# Patient Record
Sex: Female | Born: 1945
Health system: Western US, Academic
[De-identification: ages and names within clinical notes are randomized; demographics above are authoritative.]

## PROBLEM LIST (undated history)

## (undated) DIAGNOSIS — E119 Type 2 diabetes mellitus without complications: Secondary | ICD-10-CM

## (undated) DIAGNOSIS — I10 Essential (primary) hypertension: Secondary | ICD-10-CM

## (undated) DIAGNOSIS — R5381 Other malaise: Secondary | ICD-10-CM

## (undated) DIAGNOSIS — R000 Tachycardia, unspecified: Secondary | ICD-10-CM

## (undated) DIAGNOSIS — R1084 Generalized abdominal pain: Secondary | ICD-10-CM

## (undated) DIAGNOSIS — K5150 Left sided colitis without complications: Secondary | ICD-10-CM

## (undated) DIAGNOSIS — E7800 Pure hypercholesterolemia, unspecified: Secondary | ICD-10-CM

## (undated) DIAGNOSIS — R1902 Left upper quadrant abdominal swelling, mass and lump: Secondary | ICD-10-CM

## (undated) DIAGNOSIS — I952 Hypotension due to drugs: Secondary | ICD-10-CM

## (undated) DIAGNOSIS — K529 Noninfective gastroenteritis and colitis, unspecified: Secondary | ICD-10-CM

## (undated) DIAGNOSIS — Z8679 Personal history of other diseases of the circulatory system: Secondary | ICD-10-CM

## (undated) DIAGNOSIS — N200 Calculus of kidney: Secondary | ICD-10-CM

## (undated) DIAGNOSIS — F411 Generalized anxiety disorder: Secondary | ICD-10-CM

## (undated) DIAGNOSIS — E538 Deficiency of other specified B group vitamins: Secondary | ICD-10-CM

## (undated) HISTORY — DX: Left upper quadrant abdominal swelling, mass and lump: R19.02

## (undated) HISTORY — DX: Hypercalcemia: E83.52

## (undated) HISTORY — PX: ABDOMINAL HYSTERECTOMY: SHX81

## (undated) HISTORY — DX: Pure hypercholesterolemia, unspecified: E78.00

## (undated) HISTORY — DX: Other malaise: R53.81

## (undated) HISTORY — DX: Noninfective gastroenteritis and colitis, unspecified: K52.9

## (undated) HISTORY — DX: Generalized abdominal pain: R10.84

## (undated) HISTORY — DX: Hypotension due to drugs: I95.2

## (undated) HISTORY — DX: Generalized anxiety disorder: F41.1

## (undated) HISTORY — DX: Tachycardia, unspecified: R00.0

## (undated) HISTORY — DX: Personal history of other diseases of the circulatory system: Z86.79

## (undated) HISTORY — DX: Left sided colitis without complications: K51.50

## (undated) HISTORY — DX: Deficiency of other specified B group vitamins: E53.8

---

## 2014-06-22 ENCOUNTER — Encounter: Payer: Self-pay | Admitting: Emergency Medicine

## 2014-06-22 ENCOUNTER — Emergency Department (EMERGENCY_DEPARTMENT_HOSPITAL): Payer: 59

## 2014-06-22 ENCOUNTER — Emergency Department
Admission: EM | Admit: 2014-06-22 | Discharge: 2014-06-22 | Payer: 59 | Attending: Emergency Medicine | Admitting: Emergency Medicine

## 2014-06-22 DIAGNOSIS — E162 Hypoglycemia, unspecified: Secondary | ICD-10-CM | POA: Insufficient documentation

## 2014-06-22 DIAGNOSIS — R0989 Other specified symptoms and signs involving the circulatory and respiratory systems: Secondary | ICD-10-CM

## 2014-06-22 DIAGNOSIS — E11649 Type 2 diabetes mellitus with hypoglycemia without coma: Principal | ICD-10-CM | POA: Insufficient documentation

## 2014-06-22 DIAGNOSIS — Z794 Long term (current) use of insulin: Secondary | ICD-10-CM | POA: Insufficient documentation

## 2014-06-22 DIAGNOSIS — R918 Other nonspecific abnormal finding of lung field: Secondary | ICD-10-CM

## 2014-06-22 DIAGNOSIS — R9431 Abnormal electrocardiogram [ECG] [EKG]: Secondary | ICD-10-CM

## 2014-06-22 DIAGNOSIS — E876 Hypokalemia: Secondary | ICD-10-CM | POA: Insufficient documentation

## 2014-06-22 DIAGNOSIS — I10 Essential (primary) hypertension: Secondary | ICD-10-CM | POA: Insufficient documentation

## 2014-06-22 DIAGNOSIS — I517 Cardiomegaly: Secondary | ICD-10-CM

## 2014-06-22 DIAGNOSIS — I160 Hypertensive urgency: Secondary | ICD-10-CM | POA: Insufficient documentation

## 2014-06-22 HISTORY — DX: Essential (primary) hypertension: I10

## 2014-06-22 HISTORY — DX: Type 2 diabetes mellitus without complications: E11.9

## 2014-06-22 LAB — COMPREHENSIVE METABOLIC PANEL
ALANINE TRANSFERASE (ALT): 21 U/L (ref 5–54)
ALBUMIN: 2.1 g/dL — AB (ref 3.2–4.6)
ALKALINE PHOSPHATASE (ALP): 143 U/L — AB (ref 35–115)
ASPARTATE TRANSAMINASE (AST): 48 U/L — AB (ref 15–43)
BILIRUBIN TOTAL: 0.4 mg/dL (ref 0.3–1.3)
CALCIUM: 8.1 mg/dL — AB (ref 8.6–10.5)
CARBON DIOXIDE TOTAL: 30 meq/L (ref 24–32)
CHLORIDE: 98 meq/L (ref 95–110)
CREATININE BLOOD: 1 mg/dL (ref 0.44–1.27)
E-GFR, NON-AFRICAN AMERICAN: 55 SEE NOTE — AB (ref >60)
GLUCOSE: 130 mg/dL — AB (ref 70–99)
POTASSIUM: 1.8 meq/L — AB (ref 3.3–5.0)
PROTEIN: 5.3 g/dL — AB (ref 6.3–8.3)
SODIUM: 138 meq/L (ref 135–145)
UREA NITROGEN, BLOOD (BUN): 5 mg/dL — AB (ref 8–22)

## 2014-06-22 LAB — CBC WITH DIFFERENTIAL
BASOPHILS % AUTO: 0.2 %
BASOPHILS ABS AUTO: 0 10*3/uL (ref 0–0.2)
EOSINOPHIL % AUTO: 0.6 %
EOSINOPHIL ABS AUTO: 0.1 10*3/uL (ref 0–0.5)
HEMATOCRIT: 29.9 % — AB (ref 36–46)
HEMOGLOBIN: 9.4 g/dL — AB (ref 12.0–16.0)
LYMPHOCYTE ABS AUTO: 0.7 10*3/uL — AB (ref 1.0–4.8)
LYMPHOCYTES % AUTO: 6.7 %
MCH: 26.7 pg — AB (ref 27–33)
MCHC: 31.3 % — AB (ref 32–36)
MCV: 85.4 UM3 (ref 80–100)
MONOCYTES % AUTO: 6.1 %
MONOCYTES ABS AUTO: 0.6 10*3/uL (ref 0.1–0.8)
MPV: 7.1 UM3 (ref 6.8–10.0)
NEUTROPHIL ABS AUTO: 9.1 10*3/uL — AB (ref 1.80–7.70)
NEUTROPHILS % AUTO: 86.4 %
PLATELET COUNT: 319 10*3/uL (ref 130–400)
RDW: 16.5 U — AB (ref 0–14.7)
RED CELL COUNT: 3.5 10*6/uL — AB (ref 4.0–5.2)
WHITE BLOOD CELL COUNT: 10.6 10*3/uL (ref 4.5–11.0)

## 2014-06-22 LAB — BASIC METABOLIC PANEL
CALCIUM: 8.2 mg/dL — AB (ref 8.6–10.5)
CARBON DIOXIDE TOTAL: 30 meq/L (ref 24–32)
CHLORIDE: 97 meq/L (ref 95–110)
CREATININE BLOOD: 1.01 mg/dL (ref 0.44–1.27)
E-GFR, NON-AFRICAN AMERICAN: 54 SEE NOTE — AB (ref >60)
GLUCOSE: 71 mg/dL (ref 70–99)
POTASSIUM: 2.3 meq/L — AB (ref 3.3–5.0)
SODIUM: 139 meq/L (ref 135–145)
UREA NITROGEN, BLOOD (BUN): 5 mg/dL — AB (ref 8–22)

## 2014-06-22 LAB — BLD GAS VENOUS
BASE EXCESS, VEN: 6 meq/L — AB (ref <=2)
HCO3, VEN: 32 meq/L — AB (ref 20–28)
O2 SAT, VEN: 72 % (ref 70–100)
PCO2, VEN: 52 mmHg — AB (ref 35–50)
PH, VEN: 7.4 (ref 7.3–7.4)
PO2, VEN: 39 mmHg (ref 30–55)

## 2014-06-22 LAB — THYROXINE, FREE (FREE T4): THYROXINE, FREE (FREE T4): 1.03 ng/dL (ref 0.56–1.64)

## 2014-06-22 LAB — LACTIC ACID, WHOLE BLD VENOUS: LACTIC ACID, WHOLE BLD VENOUS: 1.2 mmol/L (ref 0.9–1.7)

## 2014-06-22 LAB — TSH WITH FREE T4 REFLEX: THYROID STIMULATING HORMONE: 0.15 u[IU]/mL — AB (ref 0.35–3.30)

## 2014-06-22 LAB — TROPONIN I: TROPONIN I - FS: 0.03 ng/mL (ref 0–0.04)

## 2014-06-22 MED ORDER — DEXTROSE 50 % IN WATER (D50W) INTRAVENOUS SYRINGE
25.0000 g | INJECTION | Freq: Once | INTRAVENOUS | Status: AC
Start: 2014-06-22 — End: 2014-06-22
  Administered 2014-06-22: 25 g via INTRAVENOUS

## 2014-06-22 MED ORDER — POTASSIUM CHLORIDE 20 MEQ/15 ML ORAL LIQUID
40.0000 meq | Freq: Once | ORAL | Status: DC
Start: 2014-06-22 — End: 2014-06-22
  Filled 2014-06-22: qty 30

## 2014-06-22 MED ORDER — NACL 0.9% IV BOLUS - DURATION REQ
1000.0000 mL | Freq: Once | INTRAVENOUS | Status: AC
Start: 2014-06-22 — End: 2014-06-22
  Administered 2014-06-22: 1000 mL via INTRAVENOUS

## 2014-06-22 MED ORDER — POTASSIUM CHLORIDE 20 MEQ/50 ML IN STERILE WATER INTRAVENOUS PIGGYBACK
20.0000 meq | INJECTION | INTRAVENOUS | Status: AC
Start: 2014-06-22 — End: 2014-06-23
  Administered 2014-06-22 (×2): 20 meq via INTRAVENOUS
  Filled 2014-06-22 (×2): qty 1

## 2014-06-22 MED ORDER — ONDANSETRON HCL (PF) 4 MG/2 ML INJECTION SOLUTION
4.0000 mg | Freq: Three times a day (TID) | INTRAMUSCULAR | Status: DC | PRN
Start: 2014-06-22 — End: 2014-06-23
  Administered 2014-06-22: 4 mg via INTRAVENOUS

## 2014-06-22 MED ORDER — METOCLOPRAMIDE 5 MG/ML INJECTION SOLUTION
10.0000 mg | Freq: Once | INTRAMUSCULAR | Status: AC
Start: 2014-06-22 — End: 2014-06-22
  Administered 2014-06-22: 10 mg via INTRAVENOUS
  Filled 2014-06-22: qty 2

## 2014-06-22 MED ORDER — LABETALOL 5 MG/ML INTRAVENOUS SOLUTION
5.0000 mg | Freq: Once | INTRAVENOUS | Status: AC
Start: 2014-06-22 — End: 2014-06-22
  Administered 2014-06-22: 5 mg via INTRAVENOUS

## 2014-06-22 NOTE — Discharge Instructions (Signed)
Patient will need ongoing potassium checks and replacement as needed  She will need glucose checks  Her wound vac will need re-changing

## 2014-06-22 NOTE — ED Nursing Note (Signed)
Spoke with [facility], updated with information. Patient remains with no change in condition at this time.

## 2014-06-22 NOTE — ED Initial Note (Signed)
EMERGENCY DEPARTMENT PHYSICIAN NOTE - Chriss Driver       Date of Service:   06/22/2014  5:35 PM Patient's PCP: No primary care provider on file.   Note Started: 06/22/2014 17:51 DOB: 01/29/1946             Chief Complaint   Patient presents with    Altered Mental Status Acute     944 Trauma Activation    The history provided by the EMS peronnel.  Interpreter used: No    Peggy Stephens is a 69yo woman with DM2, who presents to the ED with a chief complaint of altered mental status that began 1.5 hours ago. Per EMS, patient was last seen normal by family 1.5 hours ago, and was later found altered which caused them to call EMS. She was given 1 mg Glucagon en route, with a blood sugar reading of "low". She was initially GCS 3 and AxO1 en route. She arrived with a Zosyn pump that was reportedly off. History limited 2/2 altered mental status.    EMS Vitals:   BP 168/120  HR 122  Initially 90% on RA, improved to 98% on 2L     Current Med List from [facility]:  -Lisinopril 20  -Atorva 20  -Clopidogrel 75  -Docusate  -Furosemide 80-120 daily   -Glipizde 5 BID  -Humalog 24-32 ISS TIDAC  -NPH 35 QHS    A full history, including pertinent past medical and social history was reviewed.    HISTORY:  There are no hospital problems to display for this patient.   Allergies not on file   History of DM2 No past surgical history on file.   Social History    Marital Status:                     Spouse Name:                       Years of Education:                 Number of children:               Occupational History    None on file    Social History Main Topics    Smoking Status: never smoker                 Smokeless Status: Not on file                       Alcohol Use: Not on file     Drug Use: Not on file     Sexual Activity: Not on file          Other Topics            Concern    None on file    Social History Narrative    None on file     No family history on file.       Review of Systems   Unable to perform ROS: Mental  status change   Constitutional: Negative for chills and fatigue.     TRIAGE VITAL SIGNS:  Temp: (not recorded)  Temp src: (not recorded)  Pulse: 105 (06/22/14 1739)  BP: 189/76 mmHg (06/22/14 1739)  Resp: 20 (06/22/14 1739)  SpO2: 100 % (06/22/14 1739)  Weight: (not recorded)    Physical Exam   Constitutional: She appears well-developed and well-nourished.   HENT:  Head: Normocephalic and atraumatic.   Mouth/Throat: Oropharynx is clear and moist.   Eyes: Conjunctivae and EOM are normal. Pupils are equal, round, and reactive to light.   Cardiovascular: Normal rate, regular rhythm, normal heart sounds and intact distal pulses.  Exam reveals no gallop and no friction rub.    No murmur heard.  Pulses:       Radial pulses are 2+ on the right side, and 2+ on the left side.        Dorsalis pedis pulses are 2+ on the right side, and 2+ on the left side.   Pulmonary/Chest: Effort normal and breath sounds normal. No respiratory distress. She has no wheezes. She has no rales.   Abdominal: Soft. There is no tenderness.   Musculoskeletal: She exhibits no edema.   Neurological: She is alert.   Oriented only to name.   Skin: Skin is warm and dry.   R ankle with overlying bandage.   Psychiatric:   Unable to assess due to patient disoriented.   Nursing note and vitals reviewed.      INITIAL ASSESSMENT & PLAN, MEDICAL DECISION MAKING, ED COURSE:  Peggy Stephens is a 33yr old female who presents with a chief complaint of altered mental status. After history and exam, I feel the diagnosis is most likely hypoglycemia. Differential diagnosis includes, but is not limited to hypoglycemia, stroke, infectious encephalopathy, metabolic encephalopathy and intoxication. The patient is hemodynamically stable and will require clinical monitoring as an immediate intervention. Initial treatment and studies to evaluate this problem will include serial exams, labs and medications.     ED Course Update: The patient was observed in the ED. The results  of the ED evaluation were notable for the following:    Pertinent lab results:   CMP: pending  CBC: H&H 9.4/29.9  LA, WBV: 1.2  VBG: 7.40/52  A1C: pending  TSH: 0.15  BMP: K 2.3, bun 5, [place] 8.2  Troponin: 0.03, unremarkable  UA: pending  Thyroxine: unremarkable  POC Glucose: 29  POC Glucose x2: 195  POC Glucose x3: 90   POC Glucose x4: 90  POC Glucose x5: 98    Pertinent imaging results (reviewed and interpreted independently by me):   CHEST 1 VIEW  PICC line visualized, mild cardiomegaly, no consolidation.    Radiology reads:   CHEST 1 VIEW   1.Left PICC line tip is obscured but can be followed to the upper SVC.   Consider repeat radiograph with PA technique if clinically indicated.    2.Cardiomegaly and mild pulmonary vascular congestion.    Pertinent medications:   Medications   Ondansetron (ZOFRAN) Injection 4 mg (4 mg IV Given 06/22/14 1804)   Potassium Chloride 20 mEq/50 mL in Sterile Water [employer] Administration) IVPB (20 mEq IV New bag/syringe 06/22/14 2159)   NaCl 0.9% Bolus 1,000 mL (not administered)   Labetalol (NORMODYNE) Injection 5 mg (5 mg IV Given 06/22/14 1805)   Metoclopramide (REGLAN) Injection 10 mg (10 mg IV Given 06/22/14 2200)   Dextrose 50% Injection 25 g (25 g IV Given 06/22/14 1740)       Further MDM  Additional data reviewed? N/A    Interventions:   Antiemetics  NS IVF  IV labetolol  IV dextrose  IV potassium    ED Course:   Peggy Stephens is a 82yo woman with DM2 who presents with AMS 2/2 hypoglycemia as well as hypertensive urgency, the former responsive to D50 and the latter to labetalol x1. Neuro exam  entirely normal. Emesis x1 the immediate aftermath of glucagon in the field, resolved. Mechanism of hypoglyemia most likely decreased renal clearance in turn most likely from mild AKI, commonly caused by intravascular volume depletion due to the scorching [place] sun.    ATTENDING PHYSICIAN MEDICAL DECISION MAKING:  I saw the patient independently and performed my own focused exam and  history. Throughout the patient's ED course, I performed repeated bedside assessments, monitored ongoing vital signs, evaluated and interpreted the diagnostic data, and assessed for response to therapy. The findings of the diagnostic studies performed during this visit were discussed in detail with the patient or their designated representative whenever possible.    Patient Summary:   Peggy Stephens is a 43yo woman with DM2 who presents with AMS 2/2 hypoglycemia in the setting of a sulfonylurea and insulin, as well as hypertensive urgency and hypokalemia. HTN improved with labetalol. Dextrose and food stabilized BG. Hypokalemia treated with potassium IV. No reoccurrence of hypoglycemia and appropriate for floor admission. Mikael Spray EPRP would like to transfer the patient and this is appropriate.    An emergency medical condition exists. Stabilizing treatment was initiated. The patient will require admission to the hospital for further stabilization and ongoing treatment including potassium, serial glucose checks. Consultation was obtained from [facility] and admission was discussed/patient accepted. Admission plan discussed with patient (or family) and they agree with the plan.  LAST VITAL SIGNS:  Temp: (not recorded)  Temp src: (not recorded)  Pulse: 109 (06/22/14 1741)  BP: 183/87 mmHg (06/22/14 1741)  Resp: 20 (06/22/14 1741)  SpO2: 100 % (06/22/14 1741)  Weight: (not recorded)    Clinical Impression:     ICD-10-CM    1. Hypoglycemia E16.2    2. Hypokalemia E87.6    3. Hypertensive urgency I10          Disposition: Observation (likely transfer to [facility])    PRESENT ON ADMISSION:  Are any of the following four conditions present or suspected on admission: decubitus ulcer, infection from an intravascular device, infection due to an indwelling catheter, surgical site infection or pneumonia? No.    PATIENT'S GENERAL CONDITION:  Fair: Vital signs are stable and within normal limits. Patient is conscious but may be  uncomfortable. Indicators are favorable.    SCRIBE STATEMENT  I, Muling Lin, SCRIBE,  am personally taking down the notes in the presence of Dr. Brigitte Pulse and Jasper Riling  .  Electronically signed by - Georgia Dom, SCRIBE, Scribe  06/22/2014  17:51    SCRIBE DISCLAIMER   I, Brigitte Pulse, MD, personally performed the services described in this documentation, as scribed by the trained medical scribe above in my presence, and it is both accurate and complete.     Electronically signed by: Brigitte Pulse, MD, Resident      This patient was seen, evaluated, and care plan was developed with the resident.  I agree with the findings and plan as outlined in our combined note. I personally independently visualized the images and tracings as noted above.      Isa Rankin, MD      Electronically signed by: Isa Rankin, MD, Attending Physician                                        Review of Systems  Physical Exam

## 2014-06-22 NOTE — ED Nursing Note (Signed)
BIBA from home, last seen normal hour and half ago, GCS 3, unable to obtain access, BG "low", VSS 1mg  glucagon, GCS improved, external fixation to R ankle that was placed approx 1 month ago, PICC line in place, PIV established 25G dextrose administered on arrival, work up in progress

## 2014-06-22 NOTE — ED Nursing Note (Signed)
Pt transported to [facility] ED via [facility] ground transport. Pt alert & oriented x 4, ABC's intact, -C/P, -SOB, +N/-V.

## 2014-06-22 NOTE — ED Nursing Note (Signed)
Apple juice provided

## 2014-06-22 NOTE — Allied Health Progress (Signed)
CLINICAL SOCIAL SERVICES   CRISIS SERVICES CRITICAL CARE NOTE  Note Date and Time: 06/22/2014    18:36  Date of Admission: 06/22/2014  6:16 PM    Date of Service: 6/19/2-16 Referred By: medical team   Patient Name: Peggy Stephens Ethnicity: The patient's reported ethnicity is , and she identifies as Not Hispanic or Latino.        DOB: 19-Jun-1945  Age: 69yr      REASON FOR REFERRAL: [name] is a 69 year old woman who comes in by ambulance from home when her son found her not responsive in bed.  He has seen her about 1.5 hours before, and was not able to wake her up so he called 911.  Medics found her with a GCS of 3 and very low blood sugar.  She responded to treatment very quickly in the ER, and became responsive, with a GCS of 15.  She was able to be alert, awake and does not remember going unconscious.    PRESENTING PROBLEMS: Diabetic, low blood suger    PSYCHOSOCIAL INFO: Pt comes in with an external fixator on her left leg.  When she woke up she reports she had reconstructive surgery on her left foot.    DEMOGRAPHICS:   [address] [place] [address]  Phone: [phone] (home)    EMERGENCY CONTACTS: Husband - Jazzi Pastran - [phone], son, Emilio Aspen - [phone]    ASSESSMENT:  The patient and family's: This patient is being transferred to [facility] now that she has stablized.  Her family knows of this plan.  Ability to cope with current situation: Good, patient reports she feels much better now that her sugars are better.  Support system: Family    INTERVENTION:   ID the patient, called her husband and son, support to the patient and her family.l  DISPOSITION / PLAN:  Transfer to [facility].  Family knows of this plan.  Crisis to follow while the patient is in the ER.    Saivion Goettel A. Giuseppina Quinones, LCSW  Crisis Services, pg. [phone]

## 2014-06-22 NOTE — ED Nursing Note (Signed)
Pt sitting up, states nausea is now resolved, BG 98, attempting to eat crackers

## 2014-06-22 NOTE — ED Triage Note (Signed)
BIBA ALOC FS "low" by EMS; glucagon given; 160/120; 122; 16; 93%.  GCS 3.

## 2014-06-22 NOTE — ED Nursing Note (Signed)
Patient discharged for interfacility transfer. Documentation updated and complete. Interfacility Transfer form completed and reviewed. All copies of documentation provided at discharge. Patient transferred to [facility] ED and report communicated to [facility] (receiving facility) @ 2150 (time). Patient transported via EMS ground ([facility]). Patient's belongings sent with transport crew.    Pt's spouse Damita Ciaccia notified of transport.  [phone]

## 2014-06-23 LAB — HEMOGLOBIN A1C
HGB A1C,GLUCOSE EST AVG: 117 mg/dL
HGB A1C: 5.7 % — AB (ref 3.9–5.6)

## 2014-06-26 LAB — ELECTROCARDIOGRAM WITH RHYTHM STRIP: QTC: 505

## 2014-11-15 ENCOUNTER — Emergency Department (HOSPITAL_COMMUNITY): Payer: Medicare Other

## 2014-11-15 ENCOUNTER — Emergency Department (HOSPITAL_COMMUNITY)
Admission: EM | Admit: 2014-11-15 | Discharge: 2014-11-16 | Disposition: A | Discharge status: Home / Self Care | Payer: Medicare Other | Attending: Emergency Medicine | Admitting: Emergency Medicine

## 2014-11-15 ENCOUNTER — Encounter (HOSPITAL_COMMUNITY): Payer: Self-pay

## 2014-11-15 DIAGNOSIS — Z8639 Personal history of other endocrine, nutritional and metabolic disease: Secondary | ICD-10-CM | POA: Diagnosis not present

## 2014-11-15 DIAGNOSIS — N201 Calculus of ureter: Secondary | ICD-10-CM | POA: Diagnosis not present

## 2014-11-15 DIAGNOSIS — Z7982 Long term (current) use of aspirin: Secondary | ICD-10-CM | POA: Diagnosis not present

## 2014-11-15 DIAGNOSIS — R109 Unspecified abdominal pain: Secondary | ICD-10-CM | POA: Diagnosis present

## 2014-11-15 DIAGNOSIS — R195 Other fecal abnormalities: Secondary | ICD-10-CM | POA: Diagnosis not present

## 2014-11-15 DIAGNOSIS — R197 Diarrhea, unspecified: Secondary | ICD-10-CM | POA: Diagnosis not present

## 2014-11-15 HISTORY — DX: Pure hypercholesterolemia, unspecified: E78.00

## 2014-11-15 LAB — URINE MICROSCOPIC-ADD ON

## 2014-11-15 LAB — CBC WITH DIFFERENTIAL/PLATELET
BASOS ABS: 0 10*3/uL (ref 0.0–0.1)
BASOS PCT: 0 %
Eosinophils Absolute: 0 10*3/uL (ref 0.0–0.7)
Eosinophils Relative: 0 %
HEMATOCRIT: 40.7 % (ref 36.0–46.0)
HEMOGLOBIN: 13.7 g/dL (ref 12.0–15.0)
LYMPHS PCT: 9 %
Lymphs Abs: 0.8 10*3/uL (ref 0.7–4.0)
MCH: 29.8 pg (ref 26.0–34.0)
MCHC: 33.7 g/dL (ref 30.0–36.0)
MCV: 88.7 fL (ref 78.0–100.0)
MONOS PCT: 6 %
Monocytes Absolute: 0.6 10*3/uL (ref 0.1–1.0)
NEUTROS ABS: 8 10*3/uL — AB (ref 1.7–7.7)
NEUTROS PCT: 85 %
Platelets: 238 10*3/uL (ref 150–400)
RBC: 4.59 MIL/uL (ref 3.87–5.11)
RDW: 13.6 % (ref 11.5–15.5)
WBC: 9.4 10*3/uL (ref 4.0–10.5)

## 2014-11-15 LAB — URINALYSIS, ROUTINE W REFLEX MICROSCOPIC
Bilirubin Urine: NEGATIVE
GLUCOSE, UA: NEGATIVE mg/dL
Ketones, ur: 40 mg/dL — AB
Leukocytes, UA: NEGATIVE
Nitrite: NEGATIVE
PH: 6.5 (ref 5.0–8.0)
Protein, ur: NEGATIVE mg/dL
SPECIFIC GRAVITY, URINE: 1.035 — AB (ref 1.005–1.030)
Urobilinogen, UA: 0.2 mg/dL (ref 0.0–1.0)

## 2014-11-15 LAB — COMPREHENSIVE METABOLIC PANEL
ALBUMIN: 3.7 g/dL (ref 3.5–5.0)
ALK PHOS: 62 U/L (ref 38–126)
ALT: 10 U/L — ABNORMAL LOW (ref 14–54)
AST: 15 U/L (ref 15–41)
Anion gap: 9 (ref 5–15)
BILIRUBIN TOTAL: 0.9 mg/dL (ref 0.3–1.2)
BUN: 15 mg/dL (ref 6–20)
CALCIUM: 10.6 mg/dL — AB (ref 8.9–10.3)
CO2: 25 mmol/L (ref 22–32)
CREATININE: 0.95 mg/dL (ref 0.44–1.00)
Chloride: 102 mmol/L (ref 101–111)
GFR calc Af Amer: >60 mL/min (ref >60)
GFR calc non Af Amer: 59 mL/min — ABNORMAL LOW (ref >60)
GLUCOSE: 158 mg/dL — AB (ref 65–99)
Potassium: 4.4 mmol/L (ref 3.5–5.1)
Sodium: 136 mmol/L (ref 135–145)
TOTAL PROTEIN: 7.1 g/dL (ref 6.5–8.1)

## 2014-11-15 LAB — POC OCCULT BLOOD, ED: Fecal Occult Bld: POSITIVE — AB

## 2014-11-15 MED ORDER — MORPHINE SULFATE (PF) 4 MG/ML IV SOLN
4.0000 mg | Freq: Once | INTRAVENOUS | Status: AC
  Administered 2014-11-15: 4 mg via INTRAVENOUS
  Filled 2014-11-15: qty 1

## 2014-11-15 MED ORDER — TRAMADOL HCL 50 MG PO TABS: 50.0000 mg | ORAL_TABLET | Freq: Four times a day (QID) | ORAL | Status: DC | PRN

## 2014-11-15 MED ORDER — IOHEXOL 300 MG/ML  SOLN
25.0000 mL | Freq: Once | INTRAMUSCULAR | Status: AC | PRN
  Administered 2014-11-15: 25 mL via ORAL

## 2014-11-15 MED ORDER — ONDANSETRON HCL 4 MG PO TABS: 4.0000 mg | ORAL_TABLET | Freq: Four times a day (QID) | ORAL | Status: DC

## 2014-11-15 MED ORDER — ONDANSETRON HCL 4 MG/2ML IJ SOLN
4.0000 mg | Freq: Once | INTRAMUSCULAR | Status: AC
  Administered 2014-11-15: 4 mg via INTRAVENOUS
  Filled 2014-11-15: qty 2

## 2014-11-15 MED ORDER — HYDROMORPHONE HCL 1 MG/ML IJ SOLN
1.0000 mg | Freq: Once | INTRAMUSCULAR | Status: AC
  Administered 2014-11-15: 1 mg via INTRAVENOUS
  Filled 2014-11-15: qty 1

## 2014-11-15 MED ORDER — TAMSULOSIN HCL 0.4 MG PO CAPS: 0.4000 mg | ORAL_CAPSULE | Freq: Every day | ORAL | Status: DC

## 2014-11-15 MED ORDER — KETOROLAC TROMETHAMINE 30 MG/ML IJ SOLN
30.0000 mg | Freq: Once | INTRAMUSCULAR | Status: AC
  Administered 2014-11-15: 30 mg via INTRAVENOUS
  Filled 2014-11-15: qty 1

## 2014-11-15 MED ORDER — IOHEXOL 300 MG/ML  SOLN
100.0000 mL | Freq: Once | INTRAMUSCULAR | Status: AC | PRN
  Administered 2014-11-15: 100 mL via INTRAVENOUS

## 2014-11-15 NOTE — ED Notes (Signed)
Patient transported to CT 

## 2014-11-15 NOTE — Discharge Instructions (Signed)
Kidney Stones Follow-up with urology and gastroenterology. Take Zofran for nausea. Stay well hydrated. Return for inability to tolerate fluids. Kidney stones (urolithiasis) are solid masses that form inside your kidneys. The intense pain is caused by the stone moving through the kidney, ureter, bladder, and urethra (urinary tract). When the stone moves, the ureter starts to spasm around the stone. The stone is usually passed in your pee (urine).  HOME CARE  Drink enough fluids to keep your pee clear or pale yellow. This helps to get the stone out.  Take a 24-hour pee (urine) sample as told by your doctor. You may need to take another sample every 6-12 months.  Strain all pee through the provided strainer. Do not pee without peeing through the strainer, not even once. If you pee the stone out, catch it in the strainer. The stone may be as small as a grain of salt. Take this to your doctor. This will help your doctor figure out what you can do to try to prevent more kidney stones.  Only take medicine as told by your doctor.  Make changes to your daily diet as told by your doctor. You may be told to:  Limit how much salt you eat.  Eat 5 or more servings of fruits and vegetables each day.  Limit how much meat, poultry, fish, and eggs you eat.  Keep all follow-up visits as told by your doctor. This is important.  Get follow-up X-rays as told by your doctor. GET HELP IF: You have pain that gets worse even if you have been taking pain medicine. GET HELP RIGHT AWAY IF:   Your pain does not get better with medicine.  You have a fever or shaking chills.  Your pain increases and gets worse over 18 hours.  You have new belly (abdominal) pain.  You feel faint or pass out.  You are unable to pee.   This information is not intended to replace advice given to you by your health care provider. Make sure you discuss any questions you have with your health care provider.   Document Released:  06/08/2007 Document Revised: 09/10/2014 Document Reviewed: 05/23/2012 Elsevier Interactive Patient Education [employer]2016 Elsevier Inc.

## 2014-11-15 NOTE — ED Notes (Signed)
Ashely NT ambulated pt to bathroom

## 2014-11-15 NOTE — ED Notes (Signed)
Cynthia Perry ;NT reported to me pt very weak when ambulating to bathroom and then she vomited when she went to bathroom

## 2014-11-15 NOTE — ED Notes (Signed)
Pt cannot use restroom at this time, aware specimen is needed. 

## 2014-11-15 NOTE — ED Notes (Signed)
Per EMS, patient is from home.  Patient complains of left flank pain.  Recurrent diarrhea for a month's duration and noticed blood when wiping.  She states abdominal pain but it is associated with diarrhea.  She has some n/v, chillis with subjective fever and multiple family members with strep pharyngitis at home.   EMS administered 4 mg of Zofran.  BP:  128/74 P:85 R:16

## 2014-11-15 NOTE — ED Provider Notes (Signed)
CSN: [id number]     Arrival date & time 11/15/14  1838 History   First MD Initiated Contact with Patient 11/15/14 1848     Chief Complaint  Patient presents with  . Back Pain     (Consider location/radiation/quality/duration/timing/severity/associated sxs/prior Treatment) Patient is a 69 y.o. female presenting with back pain. The history is provided by the patient and a relative. No language interpreter was used.  Back Pain Associated symptoms: abdominal pain   Associated symptoms: no fever   Ms. Nathanial MillmanStrom is a 69 y.o female with no past medical history who presents via EMS from home for sudden onset left flank pain that began 3 hours ago. She states she has had recurrent diarrhea for over a month and has noticed bright red blood when wiping. She is also complaining of left-sided abdominal pain. Her daughter states she normally has diarrhea and that they have cut gluten out of her diet which has helped. For the past week the diarrhea has returned even though she has not eaten gluten. She has had one episode of vomiting while in the ED. There are multiple family members at home with strep. En route to the hospital she was given 4 mg of Zofran by EMS. She denies any fever, chills, chest pain, shortness of breath, nausea, constipation, dysuria, hematuria, or urinary frequency.   Past Medical History  Diagnosis Date  . Hypercholesteremia    Past Surgical History  Procedure Laterality Date  . Abdominal hysterectomy     History reviewed. No pertinent family history. Social History  Substance Use Topics  . Smoking status: Never Smoker   . Smokeless tobacco: None  . Alcohol Use: No   OB History    No data available     Review of Systems  Constitutional: Negative for fever and chills.  Gastrointestinal: Positive for vomiting, abdominal pain, diarrhea and blood in stool.  Musculoskeletal: Positive for back pain.  All other systems reviewed and are negative.     Allergies  Review of  patient's allergies indicates no known allergies.  Home Medications   Prior to Admission medications   Medication Sig Start Date End Date Taking? Authorizing Provider  aspirin 325 MG tablet Take 325 mg by mouth once.   Yes Historical Provider, MD  ondansetron (ZOFRAN) 4 MG tablet Take 1 tablet (4 mg total) by mouth every 6 (six) hours. 11/15/14   Adelaine Roppolo Patel-Mills, PA-C  tamsulosin (FLOMAX) 0.4 MG CAPS capsule Take 1 capsule (0.4 mg total) by mouth daily after breakfast. 11/15/14   Catha GosselinHanna Patel-Mills, PA-C  traMADol (ULTRAM) 50 MG tablet Take 1 tablet (50 mg total) by mouth every 6 (six) hours as needed. 11/15/14   Yamili Lichtenwalner Patel-Mills, PA-C   BP 103/60 mmHg  Pulse 80  Temp(Src) 97.6 F (36.4 C) (Oral)  Resp 16  SpO2 93% Physical Exam  Constitutional: She is oriented to person, place, and time. She appears well-developed and well-nourished.  HENT:  Head: Normocephalic and atraumatic.  Eyes: Conjunctivae are normal.  Neck: Normal range of motion. Neck supple.  Cardiovascular: Normal rate, regular rhythm and normal heart sounds.   Pulmonary/Chest: Effort normal and breath sounds normal. No respiratory distress. She has no wheezes. She has no rales.  Abdominal: Soft. Normal appearance. She exhibits no distension. There is tenderness in the left lower quadrant. There is CVA tenderness. There is no rebound, no guarding and negative Murphy's sign.  Left-sided CVA tenderness. Left lower quadrant abdominal pain but no guarding or rebound. No abdominal distention. Abdomen is  soft. Normal appearance.   Genitourinary:  Rectal exam: Chaperone present. No fecal impaction. Tiny hemorrhoid at the 12:00 position that is pink and soft. Brown stool noted without frank blood. No anal fissures.  Musculoskeletal: Normal range of motion.  Neurological: She is alert and oriented to person, place, and time.  Skin: Skin is warm and dry.  Nursing note and vitals reviewed.   ED Course  Procedures (including  critical care time) Labs Review Labs Reviewed  CBC WITH DIFFERENTIAL/PLATELET - Abnormal; Notable for the following:    Neutro Abs 8.0 (*)    All other components within normal limits  COMPREHENSIVE METABOLIC PANEL - Abnormal; Notable for the following:    Glucose, Bld 158 (*)    Calcium 10.6 (*)    ALT 10 (*)    GFR calc non Af Amer 59 (*)    All other components within normal limits  URINALYSIS, ROUTINE W REFLEX MICROSCOPIC (NOT AT [facility]) - Abnormal; Notable for the following:    Specific Gravity, Urine 1.035 (*)    Hgb urine dipstick LARGE (*)    Ketones, ur 40 (*)    All other components within normal limits  POC OCCULT BLOOD, ED - Abnormal; Notable for the following:    Fecal Occult Bld POSITIVE (*)    All other components within normal limits  URINE MICROSCOPIC-ADD ON    Imaging Review Ct Abdomen Pelvis W Contrast  11/15/2014  CLINICAL DATA:  Sudden-onset left flank/upper abdominal pain today with nausea, vomiting and diarrhea. EXAM: CT ABDOMEN AND PELVIS WITH CONTRAST TECHNIQUE: Multidetector CT imaging of the abdomen and pelvis was performed using the standard protocol following bolus administration of intravenous contrast. CONTRAST:  25mL OMNIPAQUE IOHEXOL 300 MG/ML SOLN, OMNIPAQUE IOHEXOL 300 MG/ML SOLN COMPARISON:  None. FINDINGS: Lung bases are normal. Abdominal images demonstrate a normal liver, spleen, pancreas, gallbladder and adrenal glands. Stomach is within normal. Appendix is within normal. Kidneys are normal in size as there are a few small sub cm renal cortical hypodensities bilaterally likely cysts, although too small to characterize. There is delayed excretion of the left kidney with mild left-sided hydronephrosis and left perinephric fluid. There is a 4 mm stone over the proximal left ureter. No right renal stones. Right ureter is normal. There is moderate calcified plaque over the abdominal aorta and iliac arteries. Mild fecal retention throughout the colon.  Small bowel is within normal. Pelvic images demonstrate the bladder and rectum to be within normal. 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 There mild degenerate changes of the spine and hips. Disc space narrowing at the L4-5 and L5-S1 levels. Grade 1 anterolisthesis of L4 on L5 with moderate to severe canal stenosis at this level. IMPRESSION: 4 mm stone over the proximal left ureter causing low-grade obstruction. Several sub cm bilateral renal cortical hypodensities too small to characterize but likely cysts. Mild degenerative change of the lumbar spine with disc disease at the L4-5 and L5-S1 levels. Grade 1 anterolisthesis of L4 on L5 with moderate to severe canal stenosis at the L4-5 level. Electronically Signed   By: Elberta Fortis M.D.   On: 11/15/2014 21:21   I have personally reviewed and evaluated these images and lab results as part of my medical decision-making.   EKG Interpretation None      MDM   Final diagnoses:  Left ureteral stone   Patient presents with left flank and abdominal pain with bloody diarrhea x 1 month. She is afebrile and vital signs are stable.  She is thrashing around in pain. I ordered a CT abdomen to r/o kidney stone vs. colitis. Her specific gravity was high and patient was given fluids. She also has large hemoglobin in the urine which may indicate kidney stone. This is consistent with the sudden onset of left flank pain. Otherwise, her labs are not concerning and kidney function is normal. She was guaiac positive also which led me to believe that she may also have colitis. She has no past medical history of kidney stones. Recheck: Nurse reports that patient vomited while using the bathroom. CT abdomen shows 4 mm stone over the proximal left ureter causing low-grade obstruction with mild left-sided hydronephrosis and perinephric fluid. She has no signs of diverticulitis or colitis. Recheck: Patient states she is feeling much better but is feeling dizzy from pain  medications. I have been able to get her pain under control and she has not vomited in the last hour. I discussed with her and family that she would be sent home on Flomax, Zofran, and narcotic pain medication. I explained that she would need to follow-up with urology and gastroenterology for further evaluation of kidney stones and lower GI bleed. I discussed return precautions with the family and they verbally agreed with the plan. Medications  morphine 4 MG/ML injection 4 mg (4 mg Intravenous Given 11/15/14 1918)  morphine 4 MG/ML injection 4 mg (4 mg Intravenous Given 11/15/14 2023)  ondansetron (ZOFRAN) injection 4 mg (4 mg Intravenous Given 11/15/14 2023)  iohexol (OMNIPAQUE) 300 MG/ML solution 25 mL (25 mLs Oral Contrast Given 11/15/14 2056)  iohexol (OMNIPAQUE) 300 MG/ML solution 100 mL (100 mLs Intravenous Contrast Given 11/15/14 2056)  HYDROmorphone (DILAUDID) injection 1 mg (1 mg Intravenous Given 11/15/14 2110)  ondansetron (ZOFRAN) injection 4 mg (4 mg Intravenous Given 11/15/14 2111)  ketorolac (TORADOL) 30 MG/ML injection 30 mg (30 mg Intravenous Given 11/15/14 2234)  ondansetron (ZOFRAN) injection 4 mg (4 mg Intravenous Given 11/15/14 2323)   Filed Vitals:   11/16/14 0030  BP: 103/60  Pulse: 80  Temp: 97.6 F (36.4 C)  Resp: [address], PA-C 11/16/14 2132  Melene Plan, DO 11/16/14 2325

## 2014-11-24 DIAGNOSIS — N201 Calculus of ureter: Secondary | ICD-10-CM | POA: Diagnosis not present

## 2014-11-24 DIAGNOSIS — N23 Unspecified renal colic: Secondary | ICD-10-CM | POA: Diagnosis not present

## 2014-12-01 DIAGNOSIS — R197 Diarrhea, unspecified: Secondary | ICD-10-CM | POA: Diagnosis not present

## 2014-12-01 DIAGNOSIS — K625 Hemorrhage of anus and rectum: Secondary | ICD-10-CM | POA: Diagnosis not present

## 2014-12-01 DIAGNOSIS — K5900 Constipation, unspecified: Secondary | ICD-10-CM | POA: Diagnosis not present

## 2014-12-08 DIAGNOSIS — R634 Abnormal weight loss: Secondary | ICD-10-CM | POA: Diagnosis not present

## 2014-12-08 DIAGNOSIS — R197 Diarrhea, unspecified: Secondary | ICD-10-CM | POA: Diagnosis not present

## 2014-12-08 DIAGNOSIS — K633 Ulcer of intestine: Secondary | ICD-10-CM | POA: Diagnosis not present

## 2014-12-08 DIAGNOSIS — K5150 Left sided colitis without complications: Secondary | ICD-10-CM | POA: Diagnosis not present

## 2014-12-08 DIAGNOSIS — K626 Ulcer of anus and rectum: Secondary | ICD-10-CM | POA: Diagnosis not present

## 2014-12-08 DIAGNOSIS — K625 Hemorrhage of anus and rectum: Secondary | ICD-10-CM | POA: Diagnosis not present

## 2014-12-08 DIAGNOSIS — K5010 Crohn's disease of large intestine without complications: Secondary | ICD-10-CM | POA: Diagnosis not present

## 2014-12-08 DIAGNOSIS — R194 Change in bowel habit: Secondary | ICD-10-CM | POA: Diagnosis not present

## 2014-12-15 DIAGNOSIS — N23 Unspecified renal colic: Secondary | ICD-10-CM | POA: Diagnosis not present

## 2014-12-15 DIAGNOSIS — N201 Calculus of ureter: Secondary | ICD-10-CM | POA: Diagnosis not present

## 2015-01-16 DIAGNOSIS — K5150 Left sided colitis without complications: Secondary | ICD-10-CM | POA: Diagnosis not present

## 2015-11-25 DIAGNOSIS — K5150 Left sided colitis without complications: Secondary | ICD-10-CM | POA: Diagnosis not present

## 2016-04-15 DIAGNOSIS — K5150 Left sided colitis without complications: Secondary | ICD-10-CM | POA: Diagnosis not present

## 2016-09-29 IMAGING — CT CT ABD-PELV W/ CM
2 of 5 series · 16 of 46 positions shown, 18 images · IV contrast (OMNIPAQUE 300)
Comparison: None.

CLINICAL DATA: Sudden-onset left flank/upper abdominal pain today
with nausea, vomiting and diarrhea.

EXAM:
CT ABDOMEN AND PELVIS WITH CONTRAST
TECHNIQUE: Multidetector CT imaging of the abdomen and pelvis was performed
using the standard protocol following bolus administration of
intravenous contrast.
CONTRAST:  25mL OMNIPAQUE IOHEXOL 300 MG/ML SOLN, 100mL OMNIPAQUE
IOHEXOL 300 MG/ML SOLN

[Series 2: abd/pel with · axial · 0.74mm/px · z∈[-432,-27]mm · 13 of 91 slices shown, 15 images]
[im 5/91  soft-tissue]
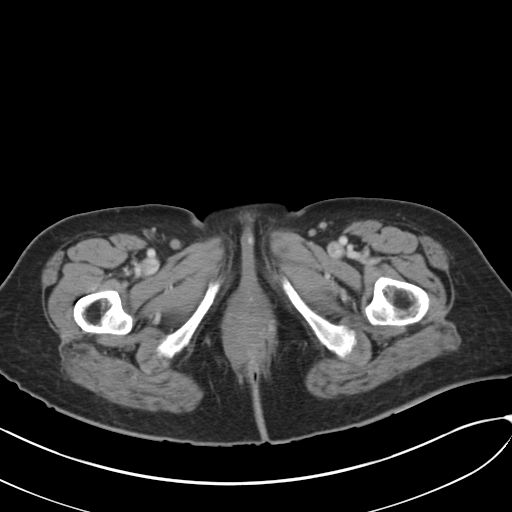
[im 5/91  bone]
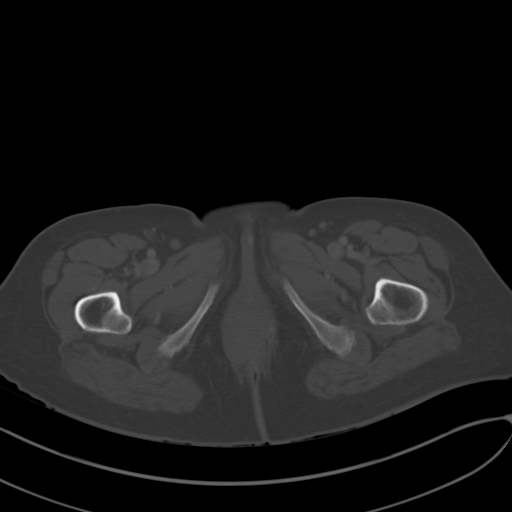
[im 14/91  soft-tissue]
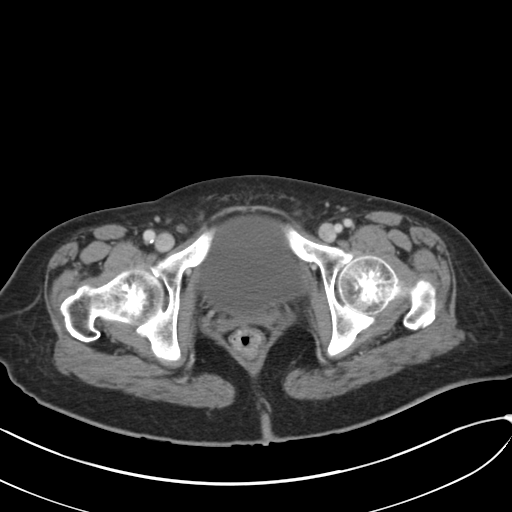
[im 19/91  soft-tissue]
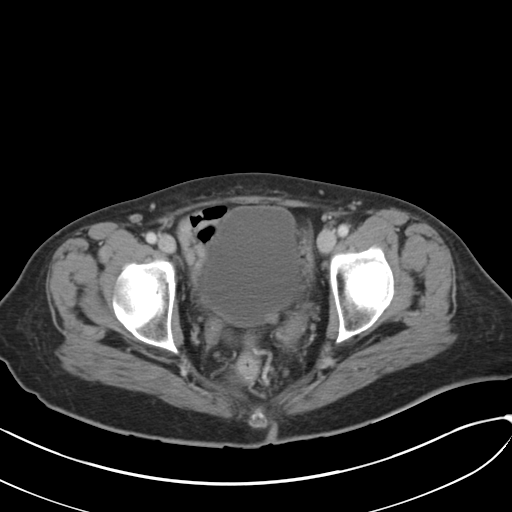
[im 28/91  soft-tissue]
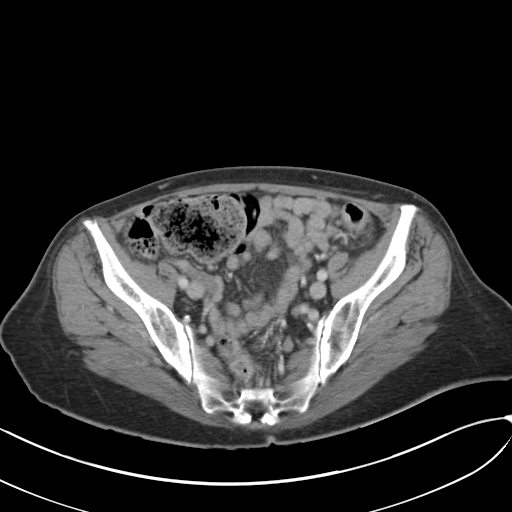
[im 32/91  soft-tissue]
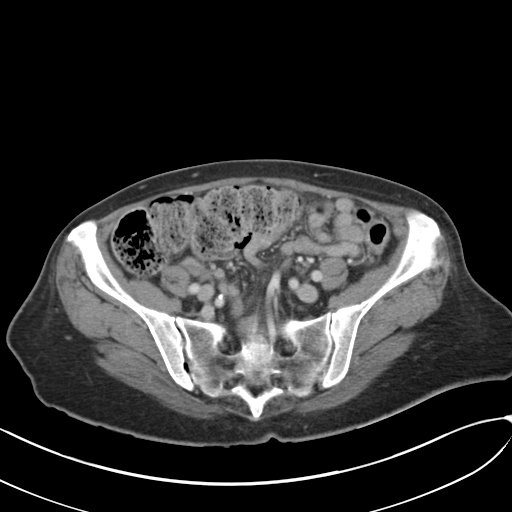
[im 41/91  soft-tissue]
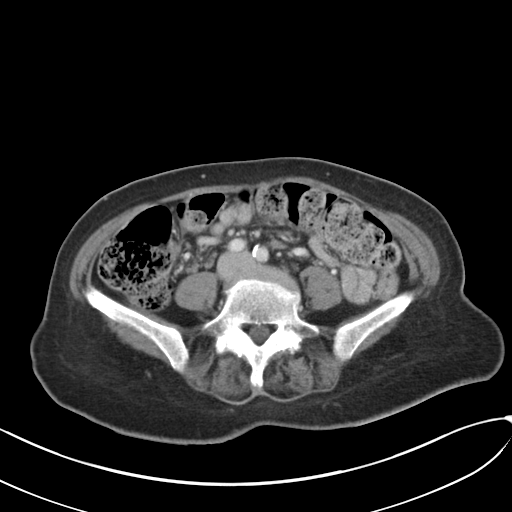
[im 46/91  soft-tissue]
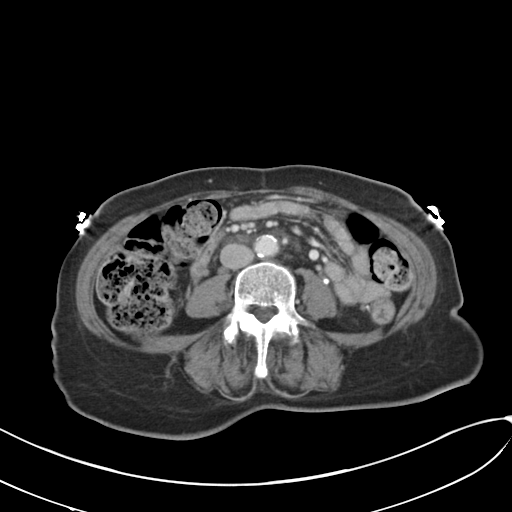
[im 50/91  soft-tissue]
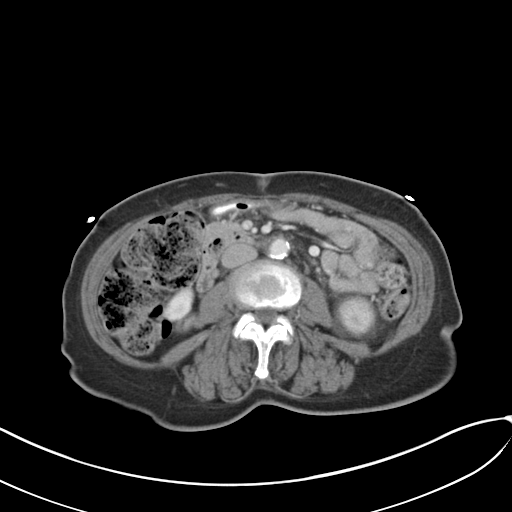
[im 59/91  soft-tissue]
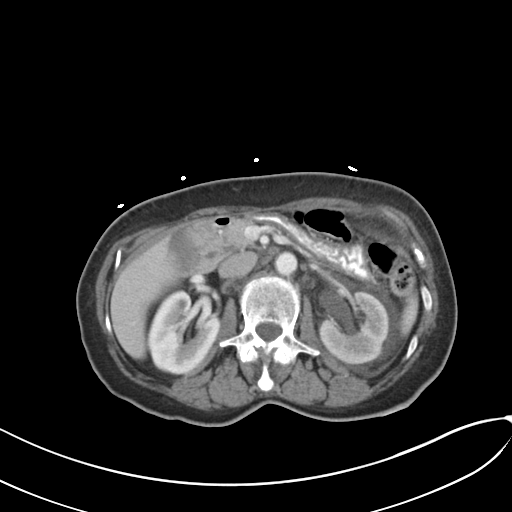
[im 59/91  bone]
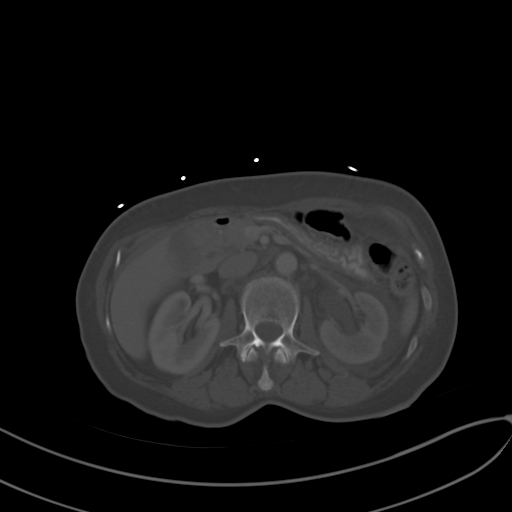
[im 64/91  soft-tissue]
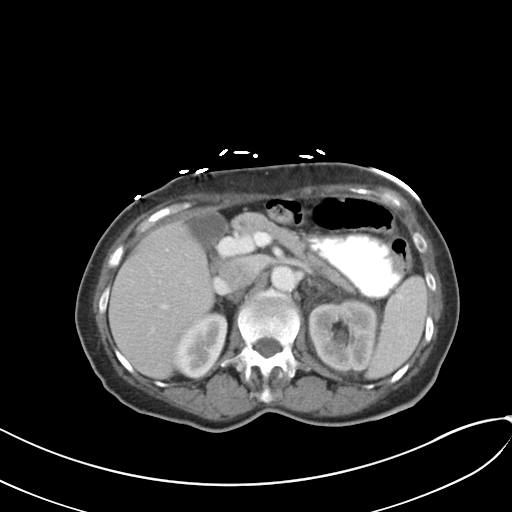
[im 73/91  soft-tissue]
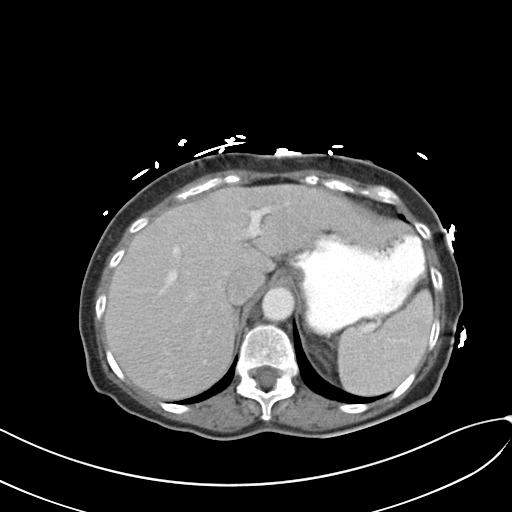
[im 77/91  soft-tissue]
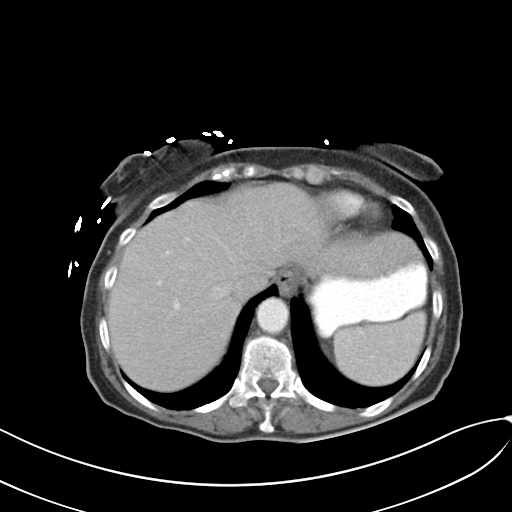
[im 86/91  soft-tissue]
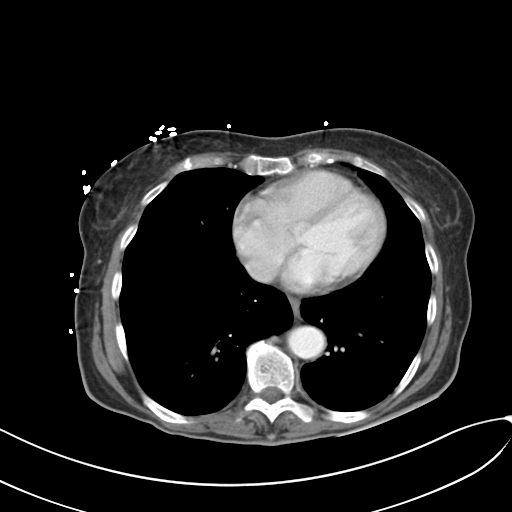

[Series 4: coronal a/|p · coronal · 0.74mm/px · 3 of 116 slices shown]
[im 39/116  soft-tissue]
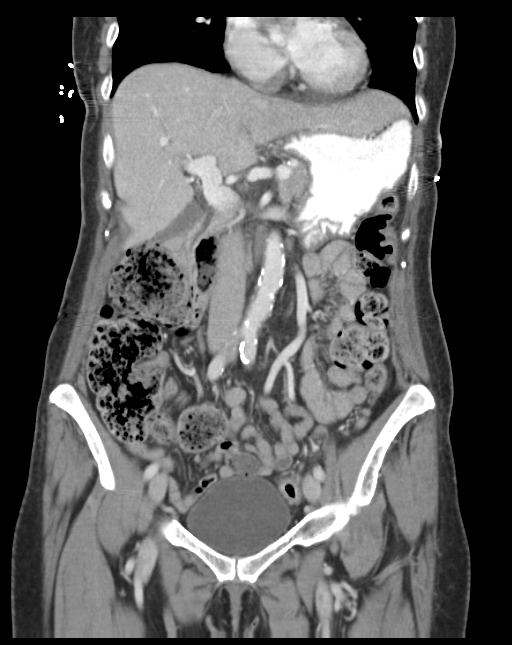
[im 52/116  soft-tissue]
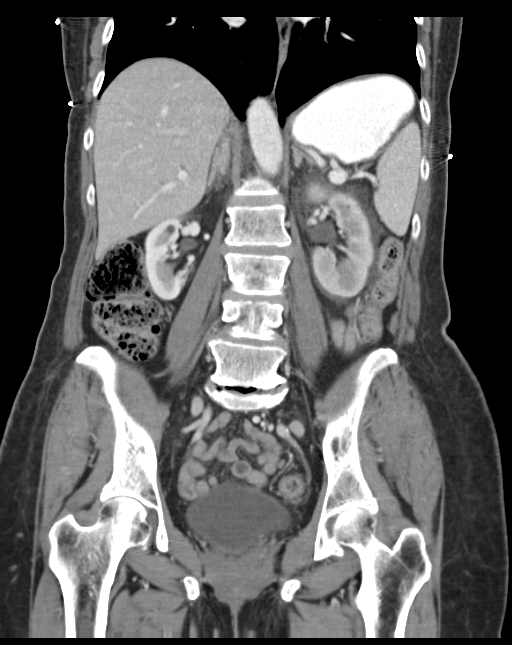
[im 64/116  soft-tissue]
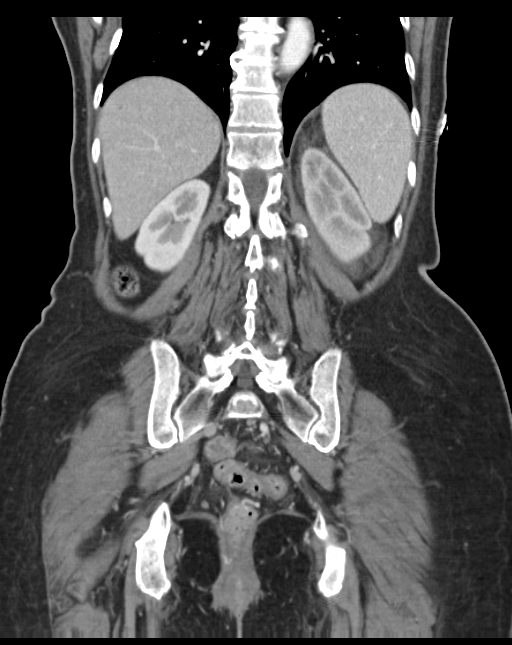

[16 of 46 positions shown; findings below may reference images not displayed]

FINDINGS: Lung bases are normal.

Abdominal images demonstrate a normal liver, spleen, pancreas,
gallbladder and adrenal glands. Stomach is within normal. Appendix
is within normal.

Kidneys are normal in size as there are a few small sub cm renal
cortical hypodensities bilaterally likely cysts, although too small
to characterize. There is delayed excretion of the left kidney with
mild left-sided hydronephrosis and left perinephric fluid. There is
a 4 mm stone over the proximal left ureter. No right renal stones.
Right ureter is normal.

There is moderate calcified plaque over the abdominal aorta and
iliac arteries. Mild fecal retention throughout the colon. Small
bowel is within normal.

Pelvic images demonstrate the bladder and rectum to be within
normal. There is surgical absence of the uterus.

There mild degenerate changes of the spine and hips. Disc space
narrowing at the L4-5 and L5-S1 levels. Grade 1 anterolisthesis of
L4 on L5 with moderate to severe canal stenosis at this level.
IMPRESSION: 4 mm stone over the proximal left ureter causing low-grade
obstruction.

Several sub cm bilateral renal cortical hypodensities too small to
characterize but likely cysts.

Mild degenerative change of the lumbar spine with disc disease at
the L4-5 and L5-S1 levels. Grade 1 anterolisthesis of L4 on L5 with
moderate to severe canal stenosis at the L4-5 level.

## 2017-01-11 DIAGNOSIS — K5150 Left sided colitis without complications: Secondary | ICD-10-CM | POA: Diagnosis not present

## 2017-02-01 ENCOUNTER — Encounter (HOSPITAL_COMMUNITY): Payer: Self-pay

## 2017-02-01 ENCOUNTER — Emergency Department (HOSPITAL_COMMUNITY): Payer: Medicare Other

## 2017-02-01 ENCOUNTER — Emergency Department (HOSPITAL_COMMUNITY)
Admission: EM | Admit: 2017-02-01 | Discharge: 2017-02-02 | Disposition: A | Discharge status: Home / Self Care | Payer: Medicare Other | Attending: Emergency Medicine | Admitting: Emergency Medicine

## 2017-02-01 DIAGNOSIS — Z79899 Other long term (current) drug therapy: Secondary | ICD-10-CM | POA: Diagnosis not present

## 2017-02-01 DIAGNOSIS — Y999 Unspecified external cause status: Secondary | ICD-10-CM | POA: Insufficient documentation

## 2017-02-01 DIAGNOSIS — W01198A Fall on same level from slipping, tripping and stumbling with subsequent striking against other object, initial encounter: Secondary | ICD-10-CM | POA: Insufficient documentation

## 2017-02-01 DIAGNOSIS — Y929 Unspecified place or not applicable: Secondary | ICD-10-CM | POA: Diagnosis not present

## 2017-02-01 DIAGNOSIS — Z7982 Long term (current) use of aspirin: Secondary | ICD-10-CM | POA: Insufficient documentation

## 2017-02-01 DIAGNOSIS — S20212A Contusion of left front wall of thorax, initial encounter: Secondary | ICD-10-CM | POA: Diagnosis not present

## 2017-02-01 DIAGNOSIS — S20219A Contusion of unspecified front wall of thorax, initial encounter: Secondary | ICD-10-CM

## 2017-02-01 DIAGNOSIS — S20302A Unspecified superficial injuries of left front wall of thorax, initial encounter: Secondary | ICD-10-CM | POA: Diagnosis present

## 2017-02-01 DIAGNOSIS — R0789 Other chest pain: Secondary | ICD-10-CM | POA: Diagnosis not present

## 2017-02-01 DIAGNOSIS — Y93E5 Activity, floor mopping and cleaning: Secondary | ICD-10-CM | POA: Insufficient documentation

## 2017-02-01 DIAGNOSIS — R42 Dizziness and giddiness: Secondary | ICD-10-CM | POA: Insufficient documentation

## 2017-02-01 DIAGNOSIS — R079 Chest pain, unspecified: Secondary | ICD-10-CM | POA: Diagnosis not present

## 2017-02-01 HISTORY — DX: Calculus of kidney: N20.0

## 2017-02-01 LAB — CBC
HCT: 43.4 % (ref 36.0–46.0)
Hemoglobin: 14.3 g/dL (ref 12.0–15.0)
MCH: 29.4 pg (ref 26.0–34.0)
MCHC: 32.9 g/dL (ref 30.0–36.0)
MCV: 89.1 fL (ref 78.0–100.0)
PLATELETS: 241 10*3/uL (ref 150–400)
RBC: 4.87 MIL/uL (ref 3.87–5.11)
RDW: 13.5 % (ref 11.5–15.5)
WBC: 6.5 10*3/uL (ref 4.0–10.5)

## 2017-02-01 LAB — BASIC METABOLIC PANEL
Anion gap: 9 (ref 5–15)
BUN: 8 mg/dL (ref 6–20)
CALCIUM: 11.1 mg/dL — AB (ref 8.9–10.3)
CO2: 25 mmol/L (ref 22–32)
CREATININE: 0.65 mg/dL (ref 0.44–1.00)
Chloride: 105 mmol/L (ref 101–111)
GFR calc non Af Amer: >60 mL/min (ref >60)
Glucose, Bld: 100 mg/dL — ABNORMAL HIGH (ref 65–99)
Potassium: 3.8 mmol/L (ref 3.5–5.1)
SODIUM: 139 mmol/L (ref 135–145)

## 2017-02-01 LAB — URINALYSIS, ROUTINE W REFLEX MICROSCOPIC
BILIRUBIN URINE: NEGATIVE
Glucose, UA: NEGATIVE mg/dL
Hgb urine dipstick: NEGATIVE
KETONES UR: NEGATIVE mg/dL
Leukocytes, UA: NEGATIVE
Nitrite: NEGATIVE
PROTEIN: NEGATIVE mg/dL
Specific Gravity, Urine: 1.013 (ref 1.005–1.030)
pH: 6 (ref 5.0–8.0)

## 2017-02-01 LAB — I-STAT TROPONIN, ED: TROPONIN I, POC: 0.01 ng/mL (ref 0.00–0.08)

## 2017-02-01 NOTE — ED Triage Notes (Signed)
Pt comes via [facility] from [facility], got dizzy this afternoon after picking something off the floor. Fall and hit her left chest on a corner of counter, some bruising noted, chest wall pain. No cardiac hx, PTA pt received 81mg  ASA and one nitro.

## 2017-02-02 DIAGNOSIS — S20212A Contusion of left front wall of thorax, initial encounter: Secondary | ICD-10-CM | POA: Diagnosis not present

## 2017-02-02 MED ORDER — ONDANSETRON 4 MG PO TBDP
4.0000 mg | ORAL_TABLET | Freq: Once | ORAL | Status: AC
Start: 2017-02-02 — End: 2017-02-02
  Administered 2017-02-02: 4 mg via ORAL
  Filled 2017-02-02: qty 1

## 2017-02-02 MED ORDER — HYDROCODONE-ACETAMINOPHEN 5-325 MG PO TABS
2.0000 | ORAL_TABLET | Freq: Once | ORAL | Status: AC
  Administered 2017-02-02: 2 via ORAL
  Filled 2017-02-02: qty 2

## 2017-02-02 MED ORDER — ONDANSETRON 4 MG PO TBDP: 4.0000 mg | ORAL_TABLET | Freq: Four times a day (QID) | ORAL | 0 refills | Status: DC | PRN

## 2017-02-02 MED ORDER — HYDROCODONE-ACETAMINOPHEN 5-325 MG PO TABS: 2.0000 | ORAL_TABLET | Freq: Four times a day (QID) | ORAL | 0 refills | Status: DC | PRN

## 2017-02-02 NOTE — ED Provider Notes (Signed)
TIME SEEN: 12:24 AM  CHIEF COMPLAINT: Chest pain  HPI: Patient is a 72 year old female with history of hyperlipidemia who presents to the emergency department with left-sided chest pain.  States that last night she was cleaning and she looked up at the ceiling and then turned quickly and felt very dizzy.  States she lost her balance and fell forward into the kitchen counter hitting the left side of her chest.  States pain is worse with palpation and deep inspiration.  Was not having any chest pain prior to hitting her chest on the countertop.  No dizziness currently.  Did not fall to the ground or strike her head.  Not on antiplatelets or anticoagulants.  ROS: See HPI Constitutional: no fever  Eyes: no drainage  ENT: no runny nose   Cardiovascular:  no chest pain  Resp: no SOB  GI: no vomiting GU: no dysuria Integumentary: no rash  Allergy: no hives  Musculoskeletal: no leg swelling  Neurological: no slurred speech ROS otherwise negative  PAST MEDICAL HISTORY/PAST SURGICAL HISTORY:  Past Medical History:  Diagnosis Date  . Hypercholesteremia   . Kidney stones     MEDICATIONS:  Prior to Admission medications   Medication Sig Start Date End Date Taking? Authorizing Provider  aspirin 325 MG tablet Take 325 mg by mouth once.    [provider]  ondansetron (ZOFRAN) 4 MG tablet Take 1 tablet (4 mg total) by mouth every 6 (six) hours. 11/15/14   Patel-Mills, Orvil Feil, PA-C  tamsulosin (FLOMAX) 0.4 MG CAPS capsule Take 1 capsule (0.4 mg total) by mouth daily after breakfast. 11/15/14   Patel-Mills, Orvil Feil, PA-C  traMADol (ULTRAM) 50 MG tablet Take 1 tablet (50 mg total) by mouth every 6 (six) hours as needed. 11/15/14   Patel-Mills, Orvil Feil, PA-C    ALLERGIES:  No Known Allergies  SOCIAL HISTORY:  Social History   Tobacco Use  . Smoking status: Never Smoker  . Smokeless tobacco: Never Used  Substance Use Topics  . Alcohol use: No    FAMILY HISTORY: No family history on  file.  EXAM: BP (!) 144/78 (BP Location: Right Arm)   Pulse 66   Temp 97.8 F (36.6 C) (Oral)   Resp 18   SpO2 98%  CONSTITUTIONAL: Alert and oriented and responds appropriately to questions. Well-appearing; well-nourished; GCS 15 HEAD: Normocephalic; atraumatic EYES: Conjunctivae clear, PERRL, EOMI ENT: normal nose; no rhinorrhea; moist mucous membranes; pharynx without lesions noted; no dental injury; no septal hematoma NECK: Supple, no meningismus, no LAD; no midline spinal tenderness, step-off or deformity; trachea midline CARD: RRR; S1 and S2 appreciated; no murmurs, no clicks, no rubs, no gallops RESP: Normal chest excursion without splinting or tachypnea; breath sounds clear and equal bilaterally; no wheezes, no rhonchi, no rales; no hypoxia or respiratory distress CHEST:  chest wall stable, no crepitus or deformity.  Patient is tender to palpation over the center in the left side of her chest with small amount of ecchymosis.  No flail chest. ABD/GI: Normal bowel sounds; non-distended; soft, non-tender, no rebound, no guarding; no ecchymosis or other lesions noted PELVIS:  stable, nontender to palpation BACK:  The back appears normal and is non-tender to palpation, there is no CVA tenderness; no midline spinal tenderness, step-off or deformity EXT: Normal ROM in all joints; non-tender to palpation; no edema; normal capillary refill; no cyanosis, no bony tenderness or bony deformity of patient's extremities, no joint effusion, compartments are soft, extremities are warm and well-perfused, no ecchymosis SKIN: Normal color  for age and race; warm NEURO: Moves all extremities equally, normal speech, cranial nerves II through XII intact, sensation to light touch intact diffusely PSYCH: The patient's mood and manner are appropriate. Grooming and personal hygiene are appropriate.  MEDICAL DECISION MAKING: Patient here with mechanical fall into her kitchen counter.  Has some bruising and  tenderness over the chest wall.  X-ray shows no sternal fracture, rib fractures, pneumothorax, pneumonia.  Seems to be a chest wall contusion.  I do not think this is ACS, PE or dissection.  The symptoms did not start until after injury.  Will discharge with pain medication as well as incentive spirometer.  Encouraged outpatient follow-up if symptoms are worsening or not improving.  Discussed return precautions.  At this time, I do not feel there is any life-threatening condition present. I have reviewed and discussed all results (EKG, imaging, lab, urine as appropriate) and exam findings with patient/family. I have reviewed nursing notes and appropriate previous records.  I feel the patient is safe to be discharged home without further emergent workup and can continue workup as an outpatient as needed. Discussed usual and customary return precautions. Patient/family verbalize understanding and are comfortable with this plan.  Outpatient follow-up has been provided if needed. All questions have been answered.      EKG Interpretation  Date/Time:  Wednesday February 01 2017 20:28:44 EST Ventricular Rate:  64 PR Interval:  176 QRS Duration: 90 QT Interval:  380 QTC Calculation: 392 R Axis:   -9 Text Interpretation:  Normal sinus rhythm Normal ECG No old tracing to compare Confirmed by Tiger Spieker, Cyril Mourning [phone]) on 02/01/2017 11:41:49 PM         Mirelle Biskup, Delice Bison, DO 02/02/17 [phone]

## 2017-02-02 NOTE — Discharge Instructions (Signed)
Please use your incentive spirometer every 2 hours while awake for the next 2 weeks.  Your x-ray showed no sign of collapsed lung or rib fracture or sternal fracture today.  Please use Colace and MiraLAX, both over-the-counter, to keep your stool soft while taking narcotic pain medication.

## 2017-02-02 NOTE — ED Notes (Signed)
Patient verbalizes understanding of discharge instructions. Opportunity for questioning and answers were provided. Armband removed by staff, pt discharged from ED.  

## 2018-01-05 ENCOUNTER — Other Ambulatory Visit: Payer: Self-pay | Admitting: Physician Assistant

## 2018-01-05 DIAGNOSIS — R1902 Left upper quadrant abdominal swelling, mass and lump: Secondary | ICD-10-CM | POA: Diagnosis not present

## 2018-01-05 DIAGNOSIS — K921 Melena: Secondary | ICD-10-CM | POA: Diagnosis not present

## 2018-01-05 DIAGNOSIS — R1084 Generalized abdominal pain: Secondary | ICD-10-CM | POA: Diagnosis not present

## 2018-01-05 DIAGNOSIS — R197 Diarrhea, unspecified: Secondary | ICD-10-CM

## 2018-01-05 DIAGNOSIS — K5150 Left sided colitis without complications: Secondary | ICD-10-CM | POA: Diagnosis not present

## 2018-01-09 ENCOUNTER — Ambulatory Visit
Admission: RE | Admit: 2018-01-09 | Discharge: 2018-01-09 | Disposition: A | Discharge status: Home / Self Care | Payer: Medicare Other | Source: Ambulatory Visit | Attending: Physician Assistant | Admitting: Physician Assistant

## 2018-01-09 DIAGNOSIS — K5150 Left sided colitis without complications: Secondary | ICD-10-CM

## 2018-01-09 DIAGNOSIS — R197 Diarrhea, unspecified: Secondary | ICD-10-CM

## 2018-01-09 DIAGNOSIS — N200 Calculus of kidney: Secondary | ICD-10-CM | POA: Diagnosis not present

## 2018-01-09 DIAGNOSIS — R1902 Left upper quadrant abdominal swelling, mass and lump: Secondary | ICD-10-CM

## 2018-01-09 DIAGNOSIS — K921 Melena: Secondary | ICD-10-CM

## 2018-01-09 DIAGNOSIS — R1084 Generalized abdominal pain: Secondary | ICD-10-CM

## 2018-01-09 DIAGNOSIS — K5190 Ulcerative colitis, unspecified, without complications: Secondary | ICD-10-CM | POA: Diagnosis not present

## 2018-01-09 MED ORDER — IOPAMIDOL (ISOVUE-300) INJECTION 61%
100.0000 mL | Freq: Once | INTRAVENOUS | Status: AC | PRN
  Administered 2018-01-09: 100 mL via INTRAVENOUS

## 2018-01-11 DIAGNOSIS — R197 Diarrhea, unspecified: Secondary | ICD-10-CM | POA: Diagnosis not present

## 2018-02-14 DIAGNOSIS — K5150 Left sided colitis without complications: Secondary | ICD-10-CM | POA: Diagnosis not present

## 2018-03-05 DIAGNOSIS — K5150 Left sided colitis without complications: Secondary | ICD-10-CM | POA: Diagnosis not present

## 2018-03-23 DIAGNOSIS — K5289 Other specified noninfective gastroenteritis and colitis: Secondary | ICD-10-CM | POA: Diagnosis not present

## 2018-03-23 DIAGNOSIS — K529 Noninfective gastroenteritis and colitis, unspecified: Secondary | ICD-10-CM | POA: Diagnosis not present

## 2018-03-23 DIAGNOSIS — K633 Ulcer of intestine: Secondary | ICD-10-CM | POA: Diagnosis not present

## 2018-03-23 DIAGNOSIS — K6389 Other specified diseases of intestine: Secondary | ICD-10-CM | POA: Diagnosis not present

## 2018-03-23 DIAGNOSIS — K6289 Other specified diseases of anus and rectum: Secondary | ICD-10-CM | POA: Diagnosis not present

## 2018-03-23 DIAGNOSIS — K5150 Left sided colitis without complications: Secondary | ICD-10-CM | POA: Diagnosis not present

## 2018-03-27 DIAGNOSIS — K5289 Other specified noninfective gastroenteritis and colitis: Secondary | ICD-10-CM | POA: Diagnosis not present

## 2018-04-03 DIAGNOSIS — K5150 Left sided colitis without complications: Secondary | ICD-10-CM | POA: Diagnosis not present

## 2018-05-04 ENCOUNTER — Other Ambulatory Visit: Payer: Self-pay | Admitting: Hospitalist

## 2018-05-04 LAB — URINALYSIS-COMPLETE
Bilirubin Urine: NEGATIVE
Glucose Urine: NEGATIVE mg/dL
Ketones: NEGATIVE mg/dL
Nitrite Urine: NEGATIVE
Occult Blood Urine: NEGATIVE mg/dL
Protein Urine: 30 mg/dL — AB
RBC: 1 /HPF (ref 0–5)
Specific Gravity, Urine: 1.012 (ref 1.002–1.030)
Squamous EPI: <1 /HPF (ref 0–5)
Urobilinogen: NEGATIVE mg/dL (ref ?–2.0)
WBC, Urine: 69 /HPF — ABNORMAL HIGH (ref 0–5)
pH URINE: 7 (ref 4.8–7.8)

## 2018-06-05 DIAGNOSIS — K5150 Left sided colitis without complications: Secondary | ICD-10-CM | POA: Diagnosis not present

## 2018-06-18 ENCOUNTER — Other Ambulatory Visit: Payer: Self-pay | Admitting: Internal Medicine

## 2018-06-18 DIAGNOSIS — Z1159 Encounter for screening for other viral diseases: Secondary | ICD-10-CM

## 2018-06-18 LAB — COVID-2019 RNA, QUAL: SARS-CoV-2: NOT DETECTED

## 2018-07-10 DIAGNOSIS — K5150 Left sided colitis without complications: Secondary | ICD-10-CM | POA: Diagnosis not present

## 2018-07-27 ENCOUNTER — Other Ambulatory Visit: Payer: Self-pay

## 2018-07-27 DIAGNOSIS — Z431 Encounter for attention to gastrostomy: Secondary | ICD-10-CM

## 2018-07-27 LAB — BASIC METABOLIC PANEL
Calcium: 9.1 mg/dL (ref 8.6–10.5)
Carbon Dioxide Total: 25 mmol/L (ref 24–32)
Chloride: 107 mmol/L (ref 95–110)
Creatinine Serum: 1 mg/dL (ref 0.44–1.27)
E-GFR Creatinine (Female): 56 mL/min/{1.73_m2}
E-GFR, African American (Female): 64 mL/min/{1.73_m2}
Glucose: 107 mg/dL — ABNORMAL HIGH (ref 70–99)
Potassium: 3.4 mmol/L (ref 3.3–5.0)
Sodium: 142 mmol/L (ref 135–145)
Urea Nitrogen, Blood (BUN): 20 mg/dL (ref 8–22)

## 2018-07-27 LAB — URINALYSIS-COMPLETE
Bilirubin Urine: NEGATIVE
Glucose Urine: NEGATIVE mg/dL
Ketones: NEGATIVE mg/dL
Nitrite Urine: NEGATIVE
Occult Blood Urine: NEGATIVE mg/dL
Protein Urine: 30 mg/dL — AB
RBC: 15 /HPF — ABNORMAL HIGH (ref 0–5)
Specific Gravity, Urine: 1.012 (ref 1.002–1.030)
Squamous EPI: 4 /HPF (ref 0–5)
Trans Epi: 1 /HPF (ref 0–5)
Urobilinogen: NEGATIVE mg/dL (ref ?–2.0)
WBC, Urine: 373 /HPF — ABNORMAL HIGH (ref 0–5)
pH URINE: 7 (ref 4.8–7.8)

## 2018-07-27 LAB — CBC WITH DIFFERENTIAL
Basophils % Auto: 0.6 %
Basophils Abs Auto: 0 10*3/uL (ref 0.0–0.2)
Eosinophils % Auto: 1.6 %
Eosinophils Abs Auto: 0.1 10*3/uL (ref 0.0–0.5)
Hematocrit: 30.4 % — ABNORMAL LOW (ref 36.0–46.0)
Hemoglobin: 10.7 g/dL — ABNORMAL LOW (ref 12.0–16.0)
Lymphocytes % Auto: 14.9 %
Lymphocytes Abs Auto: 1 10*3/uL (ref 1.0–4.8)
MCH: 31.9 pg (ref 27.0–33.0)
MCHC: 35.2 % (ref 32.0–36.0)
MCV: 90.5 fL (ref 80.0–100.0)
MPV: 8.5 fL (ref 6.8–10.0)
Monocytes % Auto: 8.4 %
Monocytes Abs Auto: 0.6 10*3/uL (ref 0.1–0.8)
Neutrophils % Auto: 74.5 %
Neutrophils Abs Auto: 5.1 10*3/uL (ref 1.8–7.7)
Platelet Count: 219 10*3/uL (ref 130–400)
RDW: 13.2 % (ref 0.0–14.7)
Red Blood Cell Count: 3.36 10*6/uL — ABNORMAL LOW (ref 4.00–5.20)
White Blood Cell Count: 6.8 10*3/uL (ref 4.5–11.0)

## 2018-09-25 DIAGNOSIS — K5150 Left sided colitis without complications: Secondary | ICD-10-CM | POA: Diagnosis not present

## 2018-10-08 DIAGNOSIS — Z23 Encounter for immunization: Secondary | ICD-10-CM | POA: Diagnosis not present

## 2018-12-17 DIAGNOSIS — K5150 Left sided colitis without complications: Secondary | ICD-10-CM | POA: Diagnosis not present

## 2019-01-31 ENCOUNTER — Ambulatory Visit: Payer: BLUE CROSS/BLUE SHIELD

## 2019-02-07 ENCOUNTER — Ambulatory Visit: Payer: Medicare Other | Attending: Internal Medicine

## 2019-02-07 DIAGNOSIS — Z23 Encounter for immunization: Secondary | ICD-10-CM | POA: Insufficient documentation

## 2019-02-07 NOTE — Progress Notes (Signed)
   Covid-19 Vaccination Clinic  Name:  Cynthia Perry    MRN: [id number] DOB: Nov 12, 1945  02/07/2019  [name] was observed post Covid-19 immunization for 15 minutes without incidence. She was provided with Vaccine Information Sheet and instruction to access the V-Safe system.   [name] was instructed to call 911 with any severe reactions post vaccine: Marland Kitchen Difficulty breathing  . Swelling of your face and throat  . A fast heartbeat  . A bad rash all over your body  . Dizziness and weakness    Immunizations Administered    Name Date Dose VIS Date Route   Pfizer COVID-19 Vaccine 02/07/2019  9:05 AM 0.3 mL 12/14/2018 Intramuscular   Manufacturer: [place]   Lot: [id number]   [place]: [id number]

## 2019-02-11 ENCOUNTER — Ambulatory Visit: Payer: BLUE CROSS/BLUE SHIELD

## 2019-03-04 ENCOUNTER — Ambulatory Visit: Payer: Medicare Other | Attending: Internal Medicine

## 2019-03-04 DIAGNOSIS — Z23 Encounter for immunization: Secondary | ICD-10-CM | POA: Insufficient documentation

## 2019-03-04 NOTE — Progress Notes (Signed)
   Covid-19 Vaccination Clinic  Name:  Cynthia Perry    MRN: [id number] DOB: [birth year]  03/04/2019  Cynthia Perry was observed post Covid-19 immunization for 15 minutes without incidence. She was provided with Vaccine Information Sheet and instruction to access the V-Safe system.   Cynthia Perry was instructed to call 911 with any severe reactions post vaccine: Marland Kitchen Difficulty breathing  . Swelling of your face and throat  . A fast heartbeat  . A bad rash all over your body  . Dizziness and weakness    Immunizations Administered    Name Date Dose VIS Date Route   Pfizer COVID-19 Vaccine 03/04/2019  1:28 PM 0.3 mL 12/14/2018 Intramuscular   Manufacturer: [place]   Lot: [id number]   [place]: [id number]

## 2019-03-07 ENCOUNTER — Other Ambulatory Visit: Payer: Self-pay | Admitting: Hospitalist

## 2019-03-07 DIAGNOSIS — N390 Urinary tract infection, site not specified: Secondary | ICD-10-CM

## 2019-03-07 LAB — CBC WITH DIFFERENTIAL
Basophils % Auto: 0.6 %
Basophils Abs Auto: 0 10*3/uL (ref 0.0–0.2)
Eosinophils % Auto: 2.4 %
Eosinophils Abs Auto: 0.1 10*3/uL (ref 0.0–0.5)
Hematocrit: 30.9 % — ABNORMAL LOW (ref 36.0–46.0)
Hemoglobin: 10.2 g/dL — ABNORMAL LOW (ref 12.0–16.0)
Lymphocytes % Auto: 37.5 %
Lymphocytes Abs Auto: 1.7 10*3/uL (ref 1.0–4.8)
MCH: 31 pg (ref 27.0–33.0)
MCHC: 33.2 % (ref 32.0–36.0)
MCV: 93.3 fL (ref 80.0–100.0)
MPV: 9.1 fL (ref 6.8–10.0)
Monocytes % Auto: 10 %
Monocytes Abs Auto: 0.5 10*3/uL (ref 0.1–0.8)
Neutrophils % Auto: 49.5 %
Neutrophils Abs Auto: 2.2 10*3/uL (ref 1.8–7.7)
Platelet Count: 171 10*3/uL (ref 130–400)
RDW: 13.9 % (ref 0.0–14.7)
Red Blood Cell Count: 3.31 10*6/uL — ABNORMAL LOW (ref 4.00–5.20)
White Blood Cell Count: 4.5 10*3/uL (ref 4.5–11.0)

## 2019-03-14 ENCOUNTER — Other Ambulatory Visit: Payer: Self-pay | Admitting: Hospitalist

## 2019-03-14 DIAGNOSIS — I10 Essential (primary) hypertension: Secondary | ICD-10-CM

## 2019-03-14 LAB — ALBUMIN: Albumin: 2.5 g/dL — ABNORMAL LOW (ref 3.2–4.6)

## 2019-03-14 LAB — CBC WITH DIFFERENTIAL
Basophils % Auto: 0.6 %
Basophils Abs Auto: 0 10*3/uL (ref 0.0–0.2)
Eosinophils % Auto: 7.1 %
Eosinophils Abs Auto: 0.4 10*3/uL (ref 0.0–0.5)
Hematocrit: 30.3 % — ABNORMAL LOW (ref 36.0–46.0)
Hemoglobin: 10.2 g/dL — ABNORMAL LOW (ref 12.0–16.0)
Lymphocytes % Auto: 13.5 %
Lymphocytes Abs Auto: 0.8 10*3/uL — ABNORMAL LOW (ref 1.0–4.8)
MCH: 31.2 pg (ref 27.0–33.0)
MCHC: 33.7 % (ref 32.0–36.0)
MCV: 92.5 fL (ref 80.0–100.0)
MPV: 9.2 fL (ref 6.8–10.0)
Monocytes % Auto: 11.3 %
Monocytes Abs Auto: 0.7 10*3/uL (ref 0.1–0.8)
Neutrophils % Auto: 67.5 %
Neutrophils Abs Auto: 4.1 10*3/uL (ref 1.8–7.7)
Platelet Count: 207 10*3/uL (ref 130–400)
RDW: 13.6 % (ref 0.0–14.7)
Red Blood Cell Count: 3.28 10*6/uL — ABNORMAL LOW (ref 4.00–5.20)
White Blood Cell Count: 6 10*3/uL (ref 4.5–11.0)

## 2019-03-14 LAB — BASIC METABOLIC PANEL
Calcium: 8.7 mg/dL (ref 8.6–10.5)
Carbon Dioxide Total: 24 mmol/L (ref 24–32)
Chloride: 108 mmol/L (ref 95–110)
Creatinine Serum: 1.02 mg/dL (ref 0.44–1.27)
E-GFR Creatinine (Female): 54 mL/min/{1.73_m2}
E-GFR, African American (Female): 62 mL/min/{1.73_m2}
Glucose: 82 mg/dL (ref 70–99)
Potassium: 4.1 mmol/L (ref 3.3–5.0)
Sodium: 142 mmol/L (ref 135–145)
Urea Nitrogen, Blood (BUN): 19 mg/dL (ref 8–22)

## 2019-03-18 ENCOUNTER — Other Ambulatory Visit: Payer: Self-pay

## 2019-03-18 DIAGNOSIS — N390 Urinary tract infection, site not specified: Secondary | ICD-10-CM

## 2019-03-18 LAB — URINALYSIS-COMPLETE
Bilirubin Urine: NEGATIVE
Glucose Urine: NEGATIVE mg/dL
Hyaline Casts: 16 /LPF — ABNORMAL HIGH (ref 0–2)
Ketones: NEGATIVE mg/dL
Nitrite Urine: POSITIVE — AB
Occult Blood Urine: NEGATIVE mg/dL
Protein Urine: 30 mg/dL — AB
RBC: 8 /HPF — ABNORMAL HIGH (ref 0–2)
Specific Gravity, Urine: 1.016 (ref 1.002–1.030)
Urobilinogen: NEGATIVE mg/dL (ref ?–2.0)
WBC, Urine: 1098 /HPF — ABNORMAL HIGH (ref 0–5)
pH URINE: 6 (ref 4.8–7.8)

## 2019-05-09 DIAGNOSIS — H524 Presbyopia: Secondary | ICD-10-CM | POA: Diagnosis not present

## 2019-05-09 DIAGNOSIS — H259 Unspecified age-related cataract: Secondary | ICD-10-CM | POA: Diagnosis not present

## 2019-05-09 DIAGNOSIS — H52223 Regular astigmatism, bilateral: Secondary | ICD-10-CM | POA: Diagnosis not present

## 2019-05-09 DIAGNOSIS — Z135 Encounter for screening for eye and ear disorders: Secondary | ICD-10-CM | POA: Diagnosis not present

## 2019-05-09 DIAGNOSIS — H5203 Hypermetropia, bilateral: Secondary | ICD-10-CM | POA: Diagnosis not present

## 2019-05-21 DIAGNOSIS — H2511 Age-related nuclear cataract, right eye: Secondary | ICD-10-CM | POA: Diagnosis not present

## 2019-05-21 DIAGNOSIS — H2512 Age-related nuclear cataract, left eye: Secondary | ICD-10-CM | POA: Diagnosis not present

## 2019-05-21 DIAGNOSIS — H34812 Central retinal vein occlusion, left eye, with macular edema: Secondary | ICD-10-CM | POA: Diagnosis not present

## 2019-05-21 DIAGNOSIS — Z01818 Encounter for other preprocedural examination: Secondary | ICD-10-CM | POA: Diagnosis not present

## 2019-06-06 DIAGNOSIS — H2512 Age-related nuclear cataract, left eye: Secondary | ICD-10-CM | POA: Diagnosis not present

## 2019-06-20 DIAGNOSIS — H2511 Age-related nuclear cataract, right eye: Secondary | ICD-10-CM | POA: Diagnosis not present

## 2019-07-24 DIAGNOSIS — H34812 Central retinal vein occlusion, left eye, with macular edema: Secondary | ICD-10-CM | POA: Diagnosis not present

## 2020-02-19 DIAGNOSIS — K5150 Left sided colitis without complications: Secondary | ICD-10-CM | POA: Diagnosis not present

## 2020-04-20 DIAGNOSIS — R197 Diarrhea, unspecified: Secondary | ICD-10-CM | POA: Diagnosis not present

## 2020-04-20 DIAGNOSIS — K5150 Left sided colitis without complications: Secondary | ICD-10-CM | POA: Diagnosis not present

## 2020-04-21 ENCOUNTER — Other Ambulatory Visit: Payer: Self-pay | Admitting: Physician Assistant

## 2020-04-21 DIAGNOSIS — R1084 Generalized abdominal pain: Secondary | ICD-10-CM | POA: Diagnosis not present

## 2020-04-21 DIAGNOSIS — E538 Deficiency of other specified B group vitamins: Secondary | ICD-10-CM | POA: Diagnosis not present

## 2020-04-21 DIAGNOSIS — K5150 Left sided colitis without complications: Secondary | ICD-10-CM | POA: Diagnosis not present

## 2020-04-21 DIAGNOSIS — Z1159 Encounter for screening for other viral diseases: Secondary | ICD-10-CM | POA: Diagnosis not present

## 2020-05-06 ENCOUNTER — Ambulatory Visit
Admission: RE | Admit: 2020-05-06 | Discharge: 2020-05-06 | Disposition: A | Discharge status: Home / Self Care | Payer: Medicare Other | Source: Ambulatory Visit | Attending: Physician Assistant | Admitting: Physician Assistant

## 2020-05-06 DIAGNOSIS — R1084 Generalized abdominal pain: Secondary | ICD-10-CM

## 2020-05-06 DIAGNOSIS — K6389 Other specified diseases of intestine: Secondary | ICD-10-CM | POA: Diagnosis not present

## 2020-05-06 DIAGNOSIS — M4316 Spondylolisthesis, lumbar region: Secondary | ICD-10-CM | POA: Diagnosis not present

## 2020-05-06 DIAGNOSIS — N281 Cyst of kidney, acquired: Secondary | ICD-10-CM | POA: Diagnosis not present

## 2020-05-06 DIAGNOSIS — R197 Diarrhea, unspecified: Secondary | ICD-10-CM | POA: Diagnosis not present

## 2020-05-06 MED ORDER — IOPAMIDOL (ISOVUE-300) INJECTION 61%
100.0000 mL | Freq: Once | INTRAVENOUS | Status: AC | PRN
  Administered 2020-05-06: 100 mL via INTRAVENOUS

## 2020-05-18 DIAGNOSIS — K6289 Other specified diseases of anus and rectum: Secondary | ICD-10-CM | POA: Diagnosis not present

## 2020-05-18 DIAGNOSIS — R197 Diarrhea, unspecified: Secondary | ICD-10-CM | POA: Diagnosis not present

## 2020-05-18 DIAGNOSIS — K5150 Left sided colitis without complications: Secondary | ICD-10-CM | POA: Diagnosis not present

## 2020-06-16 DIAGNOSIS — K6389 Other specified diseases of intestine: Secondary | ICD-10-CM | POA: Diagnosis not present

## 2020-06-16 DIAGNOSIS — K626 Ulcer of anus and rectum: Secondary | ICD-10-CM | POA: Diagnosis not present

## 2020-06-16 DIAGNOSIS — K5289 Other specified noninfective gastroenteritis and colitis: Secondary | ICD-10-CM | POA: Diagnosis not present

## 2020-06-16 DIAGNOSIS — K5150 Left sided colitis without complications: Secondary | ICD-10-CM | POA: Diagnosis not present

## 2020-06-16 DIAGNOSIS — K6289 Other specified diseases of anus and rectum: Secondary | ICD-10-CM | POA: Diagnosis not present

## 2020-06-16 DIAGNOSIS — K644 Residual hemorrhoidal skin tags: Secondary | ICD-10-CM | POA: Diagnosis not present

## 2020-06-16 DIAGNOSIS — K633 Ulcer of intestine: Secondary | ICD-10-CM | POA: Diagnosis not present

## 2020-06-16 DIAGNOSIS — K640 First degree hemorrhoids: Secondary | ICD-10-CM | POA: Diagnosis not present

## 2020-06-19 DIAGNOSIS — K5289 Other specified noninfective gastroenteritis and colitis: Secondary | ICD-10-CM | POA: Diagnosis not present

## 2020-07-03 DIAGNOSIS — R609 Edema, unspecified: Secondary | ICD-10-CM | POA: Diagnosis not present

## 2020-07-22 ENCOUNTER — Other Ambulatory Visit: Payer: Self-pay

## 2020-07-23 ENCOUNTER — Ambulatory Visit (INDEPENDENT_AMBULATORY_CARE_PROVIDER_SITE_OTHER): Payer: Medicare Other | Admitting: Family Medicine

## 2020-07-23 ENCOUNTER — Encounter: Payer: Self-pay | Admitting: Family Medicine

## 2020-07-23 VITALS — BP 106/68 | HR 111 | Temp 97.5°F | Ht 63.0 in | Wt 121.4 lb

## 2020-07-23 DIAGNOSIS — R8290 Unspecified abnormal findings in urine: Secondary | ICD-10-CM | POA: Diagnosis not present

## 2020-07-23 DIAGNOSIS — E538 Deficiency of other specified B group vitamins: Secondary | ICD-10-CM | POA: Diagnosis not present

## 2020-07-23 DIAGNOSIS — R5381 Other malaise: Secondary | ICD-10-CM | POA: Insufficient documentation

## 2020-07-23 DIAGNOSIS — R413 Other amnesia: Secondary | ICD-10-CM | POA: Diagnosis not present

## 2020-07-23 DIAGNOSIS — E7800 Pure hypercholesterolemia, unspecified: Secondary | ICD-10-CM

## 2020-07-23 DIAGNOSIS — I952 Hypotension due to drugs: Secondary | ICD-10-CM

## 2020-07-23 DIAGNOSIS — Z8679 Personal history of other diseases of the circulatory system: Secondary | ICD-10-CM

## 2020-07-23 DIAGNOSIS — F411 Generalized anxiety disorder: Secondary | ICD-10-CM | POA: Insufficient documentation

## 2020-07-23 DIAGNOSIS — R000 Tachycardia, unspecified: Secondary | ICD-10-CM

## 2020-07-23 DIAGNOSIS — F418 Other specified anxiety disorders: Secondary | ICD-10-CM | POA: Diagnosis not present

## 2020-07-23 HISTORY — DX: Unspecified abnormal findings in urine: R82.90

## 2020-07-23 LAB — URINALYSIS, ROUTINE W REFLEX MICROSCOPIC
Bilirubin Urine: NEGATIVE
Ketones, ur: NEGATIVE
Nitrite: NEGATIVE
Specific Gravity, Urine: >=1.03 — AB (ref 1.000–1.030)
Urine Glucose: NEGATIVE
Urobilinogen, UA: 0.2 (ref 0.0–1.0)
pH: 6 (ref 5.0–8.0)

## 2020-07-23 LAB — CBC
HCT: 41.7 % (ref 36.0–46.0)
Hemoglobin: 13.6 g/dL (ref 12.0–15.0)
MCHC: 32.5 g/dL (ref 30.0–36.0)
MCV: 88.2 fl (ref 78.0–100.0)
Platelets: 318 10*3/uL (ref 150.0–400.0)
RBC: 4.72 Mil/uL (ref 3.87–5.11)
RDW: 16.2 % — ABNORMAL HIGH (ref 11.5–15.5)
WBC: 11.2 10*3/uL — ABNORMAL HIGH (ref 4.0–10.5)

## 2020-07-23 LAB — COMPREHENSIVE METABOLIC PANEL
ALT: 9 U/L (ref 0–35)
AST: 9 U/L (ref 0–37)
Albumin: 3.7 g/dL (ref 3.5–5.2)
Alkaline Phosphatase: 65 U/L (ref 39–117)
BUN: 16 mg/dL (ref 6–23)
CO2: 30 mEq/L (ref 19–32)
Calcium: 11.8 mg/dL — ABNORMAL HIGH (ref 8.4–10.5)
Chloride: 102 mEq/L (ref 96–112)
Creatinine, Ser: 0.84 mg/dL (ref 0.40–1.20)
GFR: 67.4 mL/min (ref >60.00)
Glucose, Bld: 112 mg/dL — ABNORMAL HIGH (ref 70–99)
Potassium: 4.5 mEq/L (ref 3.5–5.1)
Sodium: 141 mEq/L (ref 135–145)
Total Bilirubin: 0.4 mg/dL (ref 0.2–1.2)
Total Protein: 6.7 g/dL (ref 6.0–8.3)

## 2020-07-23 LAB — LIPID PANEL
Cholesterol: 274 mg/dL — ABNORMAL HIGH (ref 0–200)
HDL: 51 mg/dL (ref >39.00)
NonHDL: 222.67
Total CHOL/HDL Ratio: 5
Triglycerides: 250 mg/dL — ABNORMAL HIGH (ref 0.0–149.0)
VLDL: 50 mg/dL — ABNORMAL HIGH (ref 0.0–40.0)

## 2020-07-23 LAB — LDL CHOLESTEROL, DIRECT: Direct LDL: 170 mg/dL

## 2020-07-23 LAB — TSH: TSH: 0.68 u[IU]/mL (ref 0.35–5.50)

## 2020-07-23 LAB — VITAMIN B12: Vitamin B-12: >1550 pg/mL — ABNORMAL HIGH (ref 211–911)

## 2020-07-23 MED ORDER — ESCITALOPRAM OXALATE 10 MG PO TABS: 10.0000 mg | ORAL_TABLET | Freq: Every day | ORAL | 1 refills | Status: DC

## 2020-07-23 NOTE — Addendum Note (Signed)
Addended by: Jon Billings on: 07/23/2020 05:06 PM   Modules accepted: Orders

## 2020-07-23 NOTE — Progress Notes (Signed)
Urine was concentrated. Please ask patient to hydrate well and return for repeat U.A.  Ldl cholesterol was elevated. We will discuss further on follow up.

## 2020-07-23 NOTE — Progress Notes (Addendum)
Established Patient Office Visit  Subjective:  Patient ID: Cynthia Perry, female    DOB: 31-Jul-1945  Age: 75 y.o. MRN: [id number]  CC:  Chief Complaint  Patient presents with   Establish Care    NP/establish care no concerns. Patient fasting.     HPI Cynthia Perry presents for establishment of care.  She is accompanied by her daughter.  She has had no primary care for some time now.  She has been seeing the gastroenterologist regularly for treatment of ulcerative colitis.  She takes prednisone 10 mg daily for this.  Was recently seen in urgent care swelling in her lower extremities and placed on an unknown diuretic with potassium.  She has no history of hypertension.  She admits to nervousness when she goes to see the doctor.  Daughter has noted memory decline over the last several months.  There seems to be associated depression.  Patient lives alone with her husband.  Another daughter assist with paying bills, keeping the house and cooking meals.  She had been active physically by walking but not so much in the last several months.  Moving her bowels regularly no problems with urination.  With her history of UC she has regular colonoscopies.  Patient has no history of heart disease or MI.  Past Medical History:  Diagnosis Date   Colitis    Hypercholesteremia    Kidney stones     Past Surgical History:  Procedure Laterality Date   ABDOMINAL HYSTERECTOMY      History reviewed. No pertinent family history.  Social History   Socioeconomic History   Marital status: Married    Spouse name: Not on file   Number of children: Not on file   Years of education: Not on file   Highest education level: Not on file  Occupational History   Not on file  Tobacco Use   Smoking status: Never   Smokeless tobacco: Never  Vaping Use   Vaping Use: Never used  Substance and Sexual Activity   Alcohol use: No   Drug use: Never   Sexual activity: Never  Other Topics Concern   Not on file   Social History Narrative   Not on file   Social Determinants of Health   Financial Resource Strain: Not on file  Food Insecurity: Not on file  Transportation Needs: Not on file  Physical Activity: Not on file  Stress: Not on file  Social Connections: Not on file  Intimate Partner Violence: Not on file    Outpatient Medications Prior to Visit  Medication Sig Dispense Refill   potassium chloride (KLOR-CON) 10 MEQ tablet Take 10 mEq by mouth daily.     predniSONE (DELTASONE) 10 MG tablet Take 10 mg by mouth daily with breakfast.     vitamin B-12 (CYANOCOBALAMIN) 500 MCG tablet Take 500 mcg by mouth daily.     HYDROcodone-acetaminophen (NORCO/VICODIN) 5-325 MG tablet Take 2 tablets by mouth every 6 (six) hours as needed. 16 tablet 0   mesalamine (APRISO) 0.375 g 24 hr capsule Take 1,500 mg by mouth daily.     ondansetron (ZOFRAN ODT) 4 MG disintegrating tablet Take 1 tablet (4 mg total) by mouth every 6 (six) hours as needed for nausea or vomiting. 20 tablet 0   No facility-administered medications prior to visit.    No Known Allergies  ROS Review of Systems  Constitutional:  Negative for chills, diaphoresis, fatigue, fever and unexpected weight change.  HENT: Negative.    Eyes:  Negative for photophobia and visual disturbance.  Respiratory: Negative.    Cardiovascular:  Positive for leg swelling. Negative for chest pain and palpitations.  Gastrointestinal: Negative.   Endocrine: Negative for polyphagia and polyuria.  Genitourinary: Negative.  Negative for dysuria, frequency and urgency.  Musculoskeletal:  Positive for gait problem.  Neurological:  Positive for weakness. Negative for speech difficulty.  Psychiatric/Behavioral:  Positive for decreased concentration. The patient is nervous/anxious.      Depression screen [facility] 2/9 07/23/2020 07/23/2020  Decreased Interest 3 0  Down, Depressed, Hopeless 3 0  PHQ - 2 Score 6 0  Altered sleeping 2 -  Tired, decreased energy 3 -   Change in appetite 1 -  Feeling bad or failure about yourself  0 -  Trouble concentrating 3 -  Moving slowly or fidgety/restless 3 -  Suicidal thoughts 0 -  PHQ-9 Score 18 -  Difficult doing work/chores Very difficult -     Objective:    Physical Exam Vitals and nursing note reviewed.  Constitutional:      Appearance: Normal appearance.  HENT:     Head: Normocephalic and atraumatic.     Right Ear: Tympanic membrane, ear canal and external ear normal.     Left Ear: Tympanic membrane, ear canal and external ear normal.     Mouth/Throat:     Mouth: Mucous membranes are moist.     Pharynx: Oropharynx is clear. No oropharyngeal exudate or posterior oropharyngeal erythema.  Cardiovascular:     Rate and Rhythm: Normal rate and regular rhythm.  Pulmonary:     Effort: Pulmonary effort is normal.     Breath sounds: Normal breath sounds.  Abdominal:     General: Bowel sounds are normal.  Musculoskeletal:     Right lower leg: Edema (swelling in foot with trace edema) present.     Left lower leg: Edema (trace) present.  Skin:    General: Skin is warm and dry.  Neurological:     Mental Status: She is alert.     Motor: Weakness present.     Comments: Oriented to person and place.   Psychiatric:        Mood and Affect: Mood normal.        Behavior: Behavior normal.    BP 106/68   Pulse (!) 111   Temp (!) 97.5 F (36.4 C) (Temporal)   Ht 5' 3"  (1.6 m)   Wt 121 lb 6.4 oz (55.1 kg)   SpO2 96%   BMI 21.51 kg/m  Wt Readings from Last 3 Encounters:  07/23/20 121 lb 6.4 oz (55.1 kg)     Health Maintenance Due  Topic Date Due   Hepatitis C Screening  Never done   TETANUS/TDAP  Never done   Zoster Vaccines- Shingrix (1 of 2) Never done   DEXA SCAN  Never done   PNA vac Low Risk Adult (1 of 2 - PCV13) Never done    There are no preventive care reminders to display for this patient.  Lab Results  Component Value Date   TSH 0.68 07/23/2020   Lab Results  Component  Value Date   WBC 11.2 (H) 07/23/2020   HGB 13.6 07/23/2020   HCT 41.7 07/23/2020   MCV 88.2 07/23/2020   PLT 318.0 07/23/2020   Lab Results  Component Value Date   NA 141 07/23/2020   K 4.5 07/23/2020   CO2 30 07/23/2020   GLUCOSE 112 (H) 07/23/2020   BUN 16 07/23/2020   CREATININE  0.84 07/23/2020   BILITOT 0.4 07/23/2020   ALKPHOS 65 07/23/2020   AST 9 07/23/2020   ALT 9 07/23/2020   PROT 6.7 07/23/2020   ALBUMIN 3.7 07/23/2020   CALCIUM 11.8 (H) 07/23/2020   ANIONGAP 9 02/01/2017   GFR 67.40 07/23/2020   Lab Results  Component Value Date   CHOL 274 (H) 07/23/2020   Lab Results  Component Value Date   HDL 51.00 07/23/2020   No results found for: [facility] Lab Results  Component Value Date   TRIG 250.0 (H) 07/23/2020   Lab Results  Component Value Date   CHOLHDL 5 07/23/2020   No results found for: HGBA1C    Assessment & Plan:   Problem List Items Addressed This Visit       Cardiovascular and Mediastinum   Hypotension due to drugs   Relevant Orders   CBC (Completed)   Comprehensive metabolic panel (Completed)   Urinalysis, Routine w reflex microscopic (Completed)     Other   Tachycardia   Relevant Orders   EKG 12-Lead (Completed)   Memory change   Relevant Orders   Ambulatory referral to Neurology   Depression with anxiety - Primary   Relevant Medications   escitalopram (LEXAPRO) 10 MG tablet   Other Relevant Orders   TSH (Completed)   Prealbumin   History of ASCVD   Vitamin B 12 deficiency   Relevant Orders   CBC (Completed)   Vitamin B12 (Completed)   Elevated cholesterol   Relevant Orders   Lipid panel (Completed)   Abnormal urinalysis   Relevant Orders   Urinalysis, Routine w reflex microscopic   Urine Culture    Meds ordered this encounter  Medications   DISCONTD: escitalopram (LEXAPRO) 10 MG tablet    Sig: Take 1 tablet (10 mg total) by mouth daily.    Dispense:  30 tablet    Refill:  1   escitalopram (LEXAPRO) 10 MG tablet     Sig: Take 1 tablet (10 mg total) by mouth at bedtime.    Dispense:  30 tablet    Refill:  1     Follow-up: Return in about 6 weeks (around 09/03/2020).  Will start Lexapro at a low dose for mood elevation and hopefully improved memory.  Neurology consultation for evaluation of dementia.  Advise discontinuation of diuretic and encouraged to use of compression stockings.  EKG confirmed sinus rhythm.   Libby Maw, MD

## 2020-07-24 LAB — PREALBUMIN: Prealbumin: 21 mg/dL (ref 17–34)

## 2020-07-24 NOTE — Progress Notes (Signed)
Assessment/Plan:    Cynthia Perry is a 75 y.o. year old female with risk factors including  age, hyperlipidemia, UC on prednisone daily, recent hypercalcemia , ASCVD, B12 deficiency, anxiety, depression  seen today for evaluation of memory loss. MoCA today is 8/30 = MMSE 14/30  with deficiencies in  all aspects of the test, except attention. Delayed recall  0/5, orientation  0/6, findings suspicious for dementia  . Patient also demonstrates hallucinations and sleep disturbance over the last 2-3 months.   Dementia with Behavioral Disturbance   Discussed safety both in and out of the home.  Discussed the importance of regular daily schedule to maintain brain function.  Continue to monitor mood. Will start Depakote 125 mg nightly for hallucinations and sleep Say active  at least 30 minutes at least 3 times a week.  CT head without contrast to assess for underlying structural abnormality and assess vascular load Naps should be scheduled and should be no longer than 60 minutes and should not occur after 2 PM.  Follow up in 3-4  months.   Case discussed with Dr. Delice Perry who agrees with the plan     Subjective:    Cynthia Perry seen in neurologic consultation at the request of Cynthia Perry,* for the evaluation of memory.  The patient is accompanied by Cynthia Perry who supplements the history.  This patient is accompanied in the office by her Cynthia Perry who supplements the history.  Previous records as well as any outside records available were reviewed prior to todays visit.     She is a  very pleasant 75 y.o. year old  [place] female who has had memory issues for about 1 year, worse over the last 6 months. It began with some depression and difficulty concentrating. Over the last 2 months, it progressed to inability to  remember the medicines to take, and misplacing them. Cynthia Perry reports that she found some meds on a plastic bag, some in her pocket. Now, she is monitored daily and the  medicines are in a pillbox, "seems to help".  She has also been more disoriented. She  sees dead family members and at times does not recognize the place she lives. She has left cat food on the counter, but forgot to feed her cat.  Misplaces objects, such as keys to the safe, found in her pants pocket. She constantly moves objects from place to place.  Her mood has been changing as well. She is scared at night with vivid dreams, but not sleepwalking; she does have auditory hallucinations.  She has some paranoia about her cats, and she calls her children to make sure that they did not run away. Denies irritability. She has been less mobile, and other than watching TV and being with her husband, does not participate in any adult activities/programs.  She forgets to bathe, but she is able to dress by herself. She does not cook, only uses the microwave although she has forgotten how to use it.  She had left an egg on the stove and forgot to use the pan recently, for which her Cynthia Perry felt cooking was unsafe for her. Her Cynthia Perry helps with the meals and with other house chores, trying to keep the house clean, because she finds these chores more difficult . Appetite is good without trouble swallowing or sialorrhea. Ambulates  with a L cane, entertaining to start using a walker.  Patient no longer drives.  Cynthia Perry assists with the bill payments. Denies  headaches, falls, or injuries to the head, double vision, dizziness, focal numbness or tingling, unilateral weakness . She has bilateral tremors, but not dropping objects. Denies sialorrhea.  Denies urine incontinence or retention. Denies constipation or diarrhea. Denies anosmia. Denies history of OSA, ETOH  Tobacco. Family History negative for dementia        TSH normal 0.68 B12  >1550  (f/u by PCP)  LDL elevated  170  TC 274, VLDL 50   Ca 11.8 ( pt dehydrated per chart, they will repeat)     CURRENT MEDICATIONS:  Outpatient Encounter Medications as of  07/27/2020  Medication Sig   acetaminophen (TYLENOL) 325 MG tablet Take 650 mg by mouth every 6 (six) hours as needed.   escitalopram (LEXAPRO) 10 MG tablet Take 1 tablet (10 mg total) by mouth at bedtime.   predniSONE (DELTASONE) 5 MG tablet Take 5 mg by mouth daily with breakfast.   vitamin B-12 (CYANOCOBALAMIN) 500 MCG tablet Take 500 mcg by mouth daily.   [DISCONTINUED] potassium chloride (KLOR-CON) 10 MEQ tablet Take 10 mEq by mouth daily.   No facility-administered encounter medications on file as of 07/27/2020.     Objective:     PHYSICAL EXAMINATION:    VITALS:   Vitals:   07/27/20 0926  BP: 101/67  Pulse: 98  SpO2: 96%  Weight: 127 lb (57.6 kg)  Height: 5' 3"  (1.6 m)    GEN:  The patient appears stated age and is in NAD. HEENT:  Normocephalic, atraumatic.   Neurological examination:  General: NAD, not well groomed, appears stated age. Orientation: The patient is alert. Oriented to person, not to place and date  Cranial nerves: There is good facial symmetry.The speech is fluent and clear. No aphasia or dysarthria. Fund of knowledge is reduced . Recent and remote memory are impaired. Attention and concentration are reduced.  Unable to name objects and repeat phrases.  Hearing is intact to conversational tone.    Sensation: Sensation is intact to light touch throughout Motor: Strength is at least antigravity x4. Tremors: bilateral resting, minimal intention tremor  DTR's 2/4 in Lilly Cognitive Assessment  07/27/2020  Visuospatial/ Executive (0/5) 1  Naming (0/3) 1  Attention: Read list of digits (0/2) 2  Attention: Read list of letters (0/1) 0  Attention: Serial 7 subtraction starting at 100 (0/3) 0  Language: Repeat phrase (0/2) 2  Language : Fluency (0/1) 0  Abstraction (0/2) 0  Delayed Recall (0/5) 0  Orientation (0/6) 0  Total 6  Adjusted Score (based on education) 7   No flowsheet data found.  No flowsheet data found.    Movement  examination: Tone: There is normal tone in the UE/LE Abnormal movements:  no tremor.  No myoclonus.  No asterixis.   Coordination:  There is no decremation with RAM's. Normal finger to nose  Gait and Station: The patient has  difficulty arising out of a deep-seated chair without the use of the hands. The patient's stride length is short without arm swing .  Gait is cautious and narrow, balance impaired, takes time to turn to return to seat.      Total time spent on today's visit was 60  minutes, including both face-to-face time and nonface-to-face time.  Time included that spent on review of records (prior notes available to me/labs/imaging if pertinent), discussing treatment and goals, answering patient's questions and coordinating care.  Cc:  Cynthia Maw, MD Sharene Butters, PA-C

## 2020-07-24 NOTE — Patient Instructions (Addendum)
It was a pleasure to see you today at our office.   Recommendations:  CT head  the office will call you to arrange you appointment Start Depakote 125 mg nightly to help with sleep and hallucinations Follow up in 3-4 month   RECOMMENDATIONS FOR ALL PATIENTS WITH MEMORY PROBLEMS: 1. Continue to exercise (Recommend 30 minutes of walking everyday, or 3 hours every week) 2. Increase social interactions - continue going to [place] and enjoy social gatherings with friends and family 3. Eat healthy, avoid fried foods and eat more fruits and vegetables 4. Maintain adequate blood pressure, blood sugar, and blood cholesterol level. Reducing the risk of stroke and cardiovascular disease also helps promoting better memory. 5. Avoid stressful situations. Live a simple life and avoid aggravations. Organize your time and prepare for the next day in anticipation. 6. Sleep well, avoid any interruptions of sleep and avoid any distractions in the bedroom that may interfere with adequate sleep quality 7. Avoid sugar, avoid sweets as there is a strong link between excessive sugar intake, diabetes, and cognitive impairment We discussed the Mediterranean diet, which has been shown to help patients reduce the risk of progressive memory disorders and reduces cardiovascular risk. This includes eating fish, eat fruits and green leafy vegetables, nuts like almonds and hazelnuts, walnuts, and also use olive oil. Avoid fast foods and fried foods as much as possible. Avoid sweets and sugar as sugar use has been linked to worsening of memory function.  There is always a concern of gradual progression of memory problems. If this is the case, then we may need to adjust level of care according to patient needs. Support, both to the patient and caregiver, should then be put into place.      You have been referred for a neuropsychological evaluation (i.e., evaluation of memory and thinking abilities). Please bring someone with you  to this appointment if possible, as it is helpful for the doctor to hear from both you and another adult who knows you well. Please bring eyeglasses and hearing aids if you wear them.    The evaluation will take approximately 3 hours and has two parts:   The first part is a clinical interview with the neuropsychologist (Dr. Melvyn Novas or Dr. Nicole Kindred). During the interview, the neuropsychologist will speak with you and the individual you brought to the appointment.    The second part of the evaluation is testing with the doctor's technician Hinton Dyer or Maudie Mercury). During the testing, the technician will ask you to remember different types of material, solve problems, and answer some questionnaires. Your family member will not be present for this portion of the evaluation.   Please note: We must reserve several hours of the neuropsychologist's time and the psychometrician's time for your evaluation appointment. As such, there is a No-Show fee of $100. If you are unable to attend any of your appointments, please contact our office as soon as possible to reschedule.    FALL PRECAUTIONS: Be cautious when walking. Scan the area for obstacles that may increase the risk of trips and falls. When getting up in the mornings, sit up at the edge of the bed for a few minutes before getting out of bed. Consider elevating the bed at the head end to avoid drop of blood pressure when getting up. Walk always in a well-lit room (use night lights in the walls). Avoid area rugs or power cords from appliances in the middle of the walkways. Use a walker or a cane if  necessary and consider physical therapy for balance exercise. Get your eyesight checked regularly.  FINANCIAL OVERSIGHT: Supervision, especially oversight when making financial decisions or transactions is also recommended.  HOME SAFETY: Consider the safety of the kitchen when operating appliances like stoves, microwave oven, and blender. Consider having supervision and share  cooking responsibilities until no longer able to participate in those. Accidents with firearms and other hazards in the house should be identified and addressed as well.   ABILITY TO BE LEFT ALONE: If patient is unable to contact 911 operator, consider using LifeLine, or when the need is there, arrange for someone to stay with patients. Smoking is a fire hazard, consider supervision or cessation. Risk of wandering should be assessed by caregiver and if detected at any point, supervision and safe proof recommendations should be instituted.  MEDICATION SUPERVISION: Inability to self-administer medication needs to be constantly addressed. Implement a mechanism to ensure safe administration of the medications.   DRIVING: Regarding driving, in patients with progressive memory problems, driving will be impaired. We advise to have someone else do the driving if trouble finding directions or if minor accidents are reported. Independent driving assessment is available to determine safety of driving.   If you are interested in the driving assessment, you can contact the following:  [employer] in [place]  [place] [phone] or [phone]    [place] refers to food and lifestyle choices that are based on the traditions of countries located on the [employer]. This way of eating has been shown to help prevent certain conditions and improve outcomes for people who have chronic diseases, like kidney disease and heart disease. What are tips for following this plan? Lifestyle  Cook and eat meals together with your family, when possible. Drink enough fluid to keep your urine clear or pale yellow. Be physically active every day. This includes: Aerobic exercise like running or swimming. Leisure activities like gardening, walking, or  housework. Get 7-8 hours of sleep each night. If recommended by your health care provider, drink red wine in moderation. This means 1 glass a day for nonpregnant women and 2 glasses a day for men. A glass of wine equals 5 oz (150 mL). Reading food labels  Check the serving size of packaged foods. For foods such as rice and pasta, the serving size refers to the amount of cooked product, not dry. Check the total fat in packaged foods. Avoid foods that have saturated fat or trans fats. Check the ingredients list for added sugars, such as corn syrup. Shopping  At the grocery store, buy most of your food from the areas near the walls of the store. This includes: Fresh fruits and vegetables (produce). Grains, beans, nuts, and seeds. Some of these may be available in unpackaged forms or large amounts (in bulk). Fresh seafood. Poultry and eggs. Low-fat dairy products. Buy whole ingredients instead of prepackaged foods. Buy fresh fruits and vegetables in-season from local farmers markets. Buy frozen fruits and vegetables in resealable bags. If you do not have access to quality fresh seafood, buy precooked frozen shrimp or canned fish, such as tuna, salmon, or sardines. Buy small amounts of raw or cooked vegetables, salads, or olives from the deli or salad bar at your store. Stock your pantry so you always have certain foods on hand, such as olive oil, canned tuna, canned tomatoes, rice, pasta, and beans. Cooking  [employer]  with extra-virgin olive oil instead of using butter or other vegetable oils. Have meat as a side dish, and have vegetables or grains as your main dish. This means having meat in small portions or adding small amounts of meat to foods like pasta or stew. Use beans or vegetables instead of meat in common dishes like chili or lasagna. Experiment with different cooking methods. Try roasting or broiling vegetables instead of steaming or sauteing them. Add frozen vegetables to soups,  stews, pasta, or rice. Add nuts or seeds for added healthy fat at each meal. You can add these to yogurt, salads, or vegetable dishes. Marinate fish or vegetables using olive oil, lemon juice, garlic, and fresh herbs. Meal planning  Plan to eat 1 vegetarian meal one day each week. Try to work up to 2 vegetarian meals, if possible. Eat seafood 2 or more times a week. Have healthy snacks readily available, such as: Vegetable sticks with hummus. Greek yogurt. Fruit and nut trail mix. Eat balanced meals throughout the week. This includes: Fruit: 2-3 servings a day Vegetables: 4-5 servings a day Low-fat dairy: 2 servings a day Fish, poultry, or lean meat: 1 serving a day Beans and legumes: 2 or more servings a week Nuts and seeds: 1-2 servings a day Whole grains: 6-8 servings a day Extra-virgin olive oil: 3-4 servings a day Limit red meat and sweets to only a few servings a month What are my food choices? Mediterranean diet Recommended Grains: Whole-grain pasta. Brown rice. Bulgar wheat. Polenta. Couscous. Whole-wheat bread. [place]. Vegetables: Artichokes. Beets. Broccoli. Cabbage. Carrots. Eggplant. Green beans. Chard. Kale. Spinach. Onions. Leeks. Peas. Squash. Tomatoes. Peppers. Radishes. Fruits: Apples. Apricots. Avocado. Berries. Bananas. Cherries. Dates. Figs. Grapes. Lemons. Melon. Oranges. Peaches. Plums. Pomegranate. Meats and other protein foods: Beans. Almonds. Sunflower seeds. Pine nuts. Peanuts. [place]. Salmon. Scallops. Shrimp. [place]. Tilapia. Clams. Oysters. Eggs. Dairy: Low-fat milk. Cheese. Greek yogurt. Beverages: Water. Red wine. Herbal tea. Fats and oils: Extra virgin olive oil. Avocado oil. Grape seed oil. Sweets and desserts: [place] yogurt with honey. Baked apples. Poached pears. Trail mix. Seasoning and other foods: Basil. Cilantro. Coriander. Cumin. Mint. Parsley. Sage. Rosemary. Tarragon. Garlic. Oregano. Thyme. Pepper. Balsalmic vinegar. Tahini. Hummus. Tomato  sauce. Olives. Mushrooms. Limit these Grains: Prepackaged pasta or rice dishes. Prepackaged cereal with added sugar. Vegetables: Deep fried potatoes (french fries). Fruits: Fruit canned in syrup. Meats and other protein foods: Beef. Pork. Lamb. Poultry with skin. Hot dogs. [place] Salines. Dairy: Ice cream. Sour cream. Whole milk. Beverages: Juice. Sugar-sweetened soft drinks. Beer. Liquor and spirits. Fats and oils: Butter. Canola oil. Vegetable oil. Beef fat (tallow). Lard. Sweets and desserts: Cookies. Cakes. Pies. Candy. Seasoning and other foods: Mayonnaise. Premade sauces and marinades. The items listed may not be a complete list. Talk with your dietitian about what dietary choices are right for you. Summary The Mediterranean diet includes both food and lifestyle choices. Eat a variety of fresh fruits and vegetables, beans, nuts, seeds, and whole grains. Limit the amount of red meat and sweets that you eat. Talk with your health care provider about whether it is safe for you to drink red wine in moderation. This means 1 glass a day for nonpregnant women and 2 glasses a day for men. A glass of wine equals 5 oz (150 mL). This information is not intended to replace advice given to you by your health care provider. Make sure you discuss any questions you have with your health care provider. Document Released: 08/13/2015 Document Revised: 09/15/2015  Document Reviewed: 08/13/2015 Elsevier Interactive Patient Education  2017 [employer].

## 2020-07-27 ENCOUNTER — Ambulatory Visit (INDEPENDENT_AMBULATORY_CARE_PROVIDER_SITE_OTHER): Payer: Medicare Other | Admitting: Physician Assistant

## 2020-07-27 ENCOUNTER — Encounter: Payer: Self-pay | Admitting: Physician Assistant

## 2020-07-27 ENCOUNTER — Other Ambulatory Visit: Payer: Self-pay

## 2020-07-27 VITALS — BP 101/67 | HR 98 | Ht 63.0 in | Wt 127.0 lb

## 2020-07-27 DIAGNOSIS — F0391 Unspecified dementia with behavioral disturbance: Secondary | ICD-10-CM

## 2020-07-27 DIAGNOSIS — R413 Other amnesia: Secondary | ICD-10-CM | POA: Diagnosis not present

## 2020-07-27 DIAGNOSIS — K5150 Left sided colitis without complications: Secondary | ICD-10-CM | POA: Insufficient documentation

## 2020-07-27 MED ORDER — DIVALPROEX SODIUM 125 MG PO DR TAB: 125.0000 mg | DELAYED_RELEASE_TABLET | Freq: Every day | ORAL | 3 refills | Status: DC

## 2020-07-29 DIAGNOSIS — K5150 Left sided colitis without complications: Secondary | ICD-10-CM | POA: Diagnosis not present

## 2020-08-14 ENCOUNTER — Ambulatory Visit
Admission: RE | Admit: 2020-08-14 | Discharge: 2020-08-14 | Disposition: A | Discharge status: Home / Self Care | Payer: Medicare Other | Source: Ambulatory Visit | Attending: Physician Assistant | Admitting: Physician Assistant

## 2020-08-14 ENCOUNTER — Other Ambulatory Visit: Payer: Self-pay

## 2020-08-14 DIAGNOSIS — R413 Other amnesia: Secondary | ICD-10-CM | POA: Diagnosis not present

## 2020-09-03 ENCOUNTER — Ambulatory Visit: Payer: Medicare Other | Admitting: Family Medicine

## 2020-09-15 ENCOUNTER — Encounter: Payer: Self-pay | Admitting: Counselor

## 2020-09-22 ENCOUNTER — Telehealth: Payer: Self-pay | Admitting: Family Medicine

## 2020-09-22 NOTE — Telephone Encounter (Signed)
Left message for patient to schedule Annual Wellness Visit.  Please schedule.

## 2020-10-30 ENCOUNTER — Encounter: Payer: Medicare Other | Admitting: Counselor

## 2020-11-05 ENCOUNTER — Encounter: Payer: Medicare Other | Admitting: Counselor

## 2020-11-09 ENCOUNTER — Ambulatory Visit: Payer: Medicare Other | Admitting: Physician Assistant

## 2020-11-20 ENCOUNTER — Other Ambulatory Visit: Payer: Self-pay | Admitting: Physician Assistant

## 2020-11-20 ENCOUNTER — Other Ambulatory Visit: Payer: Self-pay | Admitting: Family Medicine

## 2020-11-20 DIAGNOSIS — F418 Other specified anxiety disorders: Secondary | ICD-10-CM

## 2020-11-20 NOTE — Telephone Encounter (Signed)
Chart supports rx refill Last ov: 07/23/2020 Last refill: 10/17/2020

## 2020-12-03 DEATH — deceased

## 2020-12-30 ENCOUNTER — Ambulatory Visit (INDEPENDENT_AMBULATORY_CARE_PROVIDER_SITE_OTHER): Payer: Medicare Other | Admitting: Psychology

## 2020-12-30 ENCOUNTER — Encounter: Payer: Self-pay | Admitting: Psychology

## 2020-12-30 ENCOUNTER — Ambulatory Visit: Payer: Medicare Other | Admitting: Psychology

## 2020-12-30 ENCOUNTER — Other Ambulatory Visit: Payer: Self-pay

## 2020-12-30 DIAGNOSIS — F0280 Dementia in other diseases classified elsewhere without behavioral disturbance: Secondary | ICD-10-CM | POA: Diagnosis not present

## 2020-12-30 DIAGNOSIS — R4189 Other symptoms and signs involving cognitive functions and awareness: Secondary | ICD-10-CM

## 2020-12-30 DIAGNOSIS — R1902 Left upper quadrant abdominal swelling, mass and lump: Secondary | ICD-10-CM | POA: Insufficient documentation

## 2020-12-30 DIAGNOSIS — R1084 Generalized abdominal pain: Secondary | ICD-10-CM | POA: Insufficient documentation

## 2020-12-30 DIAGNOSIS — G309 Alzheimer's disease, unspecified: Secondary | ICD-10-CM

## 2020-12-30 HISTORY — DX: Dementia in other diseases classified elsewhere, unspecified severity, without behavioral disturbance, psychotic disturbance, mood disturbance, and anxiety: F02.80

## 2020-12-30 NOTE — Progress Notes (Signed)
NEUROPSYCHOLOGICAL EVALUATION Cynthia [facility]. [facility] of Neurology  Date of Evaluation: December 30, 2020  Reason for Referral:   Cynthia Perry is a 75 y.o. left-handed Caucasian female referred by  Cynthia Butters, PA-C , to characterize her current cognitive functioning and assist with diagnostic clarity and treatment planning in the context of subjective cognitive decline and concern for a neurodegenerative illness.   Assessment and Plan:   Clinical Impression(s): During testing, Ms. Cynthia Perry fatigued quickly. She also expressed notable confusion while attempting to complete early tasks. Due to the combination of these factors, she quickly expressed her intention to discontinue the evaluation, stating "I need to leave and go home." Test ordering was adjusted and the psychometrist provided encouragement for her to persist with testing. However, this was generally ineffective as Ms. Cynthia Perry persisted in her desire to end the evaluation prematurely. As such, testing was notably abbreviated in response.  Across completed tasks, Ms. Cynthia Perry pattern of performance is suggestive of severe, diffuse cognitive impairment. All domains able to be assessed (i.e., processing speed, cognitive flexibility, verbal fluency, visuoconstructional abilities, and learning and memory) exhibited severe impairment with unfortunately no tasks approaching adequate normative performances. While Ms. Cynthia Perry denied ongoing ADL dysfunction, I do not believe that she has appropriate insight into the current extent of her cognitive and functional limitations. Her husband and daughters provide assistance with ongoing medication and financial management. She also no longer drives. Overall, given severe cognitive impairment and ongoing ADL dysfunction, she meets diagnostic criteria for a Major Neurocognitive Disorder ("dementia") at the present time.  The most likely etiology for ongoing dysfunction at the  present time is unfortunately Alzheimer's disease. Despite limited testing tolerance, Ms. Cynthia Perry was fully amnestic after a delay of approximately five minutes across both story and figure-based memory tasks. When asked to recall any information she remembered, she made comments not recalling being read a story previously or that she had attempted to copy a complex figure. Of note, she was also provided a glass of ginger ale at the beginning of the testing session but when reminded to take her glass before she left, she appeared surprised and did not recall being provided this beverage. Overall, this pattern of memory impairment suggests the presence of rapid forgetting and a severe memory storage deficit, which are hallmark characteristics of Alzheimer's disease. Her daughter's report of progressive memory loss and other experiences also aligns well with what can be expected in this disease process. Despite reporting acute symptoms of moderate anxiety, depression, and sleep dysfunction, the validity of these descriptors is questionable as Ms. Cynthia Perry appeared to have trouble comprehending what they were asking and what her responses indicated. Continued medical monitoring will be important moving forward.  Recommendations: While Ms. Cynthia Perry has already advanced to a dementia stage, she could still potentially benefit from the introduction of a memory based medication. If she is interested in this, she should discuss medication options with Ms. Cynthia Perry when she meets with her again on 01/21/2021. It is important to highlight that this medication has been shown to slow functional decline in some individuals. There is no current treatment which can stop or reverse cognitive decline when caused by a neurodegenerative illness.   I agree with her and her family's decision to have her fully abstain from driving.   She will likely benefit from the establishment and maintenance of a routine in order to maximize functional  abilities over time.  It will be important for her to have  another person with her when in situations where she may need to process information, weigh the pros and cons of different options, and make decisions, in order to ensure that she fully understands and recalls all information to be considered.  If not already done, Ms. Cynthia Perry and her family may want to discuss her wishes regarding durable power of attorney and medical decision making, so that she can have input into these choices. Additionally, they may wish to discuss future plans for caretaking and seek out community options for in home/residential care should they become necessary.  Ms. Cynthia Perry is encouraged to attend to lifestyle factors for brain health (e.g., regular physical exercise, good nutrition habits, regular participation in cognitively-stimulating activities, and general stress management techniques), which are likely to have benefits for both emotional adjustment and cognition. Optimal control of vascular risk factors (including safe cardiovascular exercise and adherence to dietary recommendations) is encouraged. Continued participation in activities which provide mental stimulation and social interaction is also recommended.   Important information should be provided to Ms. Cynthia Perry in written format in all instances. This information should be placed in a highly frequented and easily visible location within her home to promote recall. External strategies such as written notes in a consistently used memory journal, visual and nonverbal auditory cues such as a calendar on the refrigerator or appointments with alarm, such as on a cell phone, can also help maximize recall.  Review of Records:   Ms. Cynthia Perry was seen by [facility] Cynthia Butters, PA-C) on 07/27/2020 for an evaluation of memory loss. Memory concerns were said to be present for at least the past 12 months but had seemed worse during the past 6 months. While difficulties  began with concentration difficulties, it reportedly progressed to rapid forgetting and trouble recalling which medications she needs to take. She will misplace items frequently. Her daughter noted that her mother will forget that she has cats despite putting food out for them. Ms. Sigmund may forget to bathe but has no trouble dressing herself. She has left an egg on the stove, leading to cooking concerns. She has also reportedly forgotten how to use the microwave. Her husband and daughters manage medications and finances. She no longer drives. There has been some report of possible hallucinations in that she may see deceased family members. There are also times where she may not recognize the place she lives. Ms. Lover denied headaches, falls, previous head injuries, double vision, dizziness, focal numbness or tingling, unilateral weakness, sialorrhea, urine incontinence or retention, constipation or diarrhea, anosmia, a history of OSA, or a history of substance abuse. Performance on a brief cognitive screening instrument (MOCA) was 7/30. Ultimately, Ms. Klas was referred for a comprehensive neuropsychological evaluation to characterize her cognitive abilities and to assist with diagnostic clarity and treatment planning.   Head CT on 08/14/2020 was negative. No additional neuroimaging was available for review.   Past Medical History:  Diagnosis Date   Abnormal urinalysis 07/23/2020   Debilitated    Elevated cholesterol    Generalized abdominal pain    Generalized anxiety disorder    History of ASCVD    Hypercalcemia    Hypercholesteremia    Hypotension due to drugs    Kidney stones    Left sided ulcerative colitis    Left upper quadrant abdominal mass    Tachycardia    Vitamin B12 deficiency     Past Surgical History:  Procedure Laterality Date   ABDOMINAL HYSTERECTOMY  Current Outpatient Medications:    budesonide (ENTOCORT EC) 3 MG 24 hr capsule, 1 tablet in the morning, Disp: , Rfl:     acetaminophen (TYLENOL) 325 MG tablet, Take 650 mg by mouth every 6 (six) hours as needed., Disp: , Rfl:    Adalimumab (HUMIRA PEN) 40 MG/0.4ML PNKT, RX#3 INJECT 1 PEN (40MG) SUBCUTANEOUSLY EVERY OTHER WEEK STARTING ON DAY 29, Disp: , Rfl:    divalproex (DEPAKOTE) 125 MG DR tablet, TAKE 1 TABLET(125 MG) BY MOUTH AT BEDTIME, Disp: 30 tablet, Rfl: 1   divalproex (DEPAKOTE) 125 MG DR tablet, 1 tablet, Disp: , Rfl:    escitalopram (LEXAPRO) 10 MG tablet, TAKE 1 TABLET(10 MG) BY MOUTH AT BEDTIME, Disp: 30 tablet, Rfl: 1   mesalamine (APRISO) 0.375 g 24 hr capsule, TAKE 4 CAPSULES BY MOUTH EVERY DAY IN THE MORNING, Disp: , Rfl:    predniSONE (DELTASONE) 5 MG tablet, Take 5 mg by mouth daily with breakfast., Disp: , Rfl:    vitamin B-12 (CYANOCOBALAMIN) 100 MCG tablet, See admin instructions., Disp: , Rfl:    vitamin B-12 (CYANOCOBALAMIN) 500 MCG tablet, Take 500 mcg by mouth daily., Disp: , Rfl:   Clinical Interview:   The following information was obtained during a clinical interview with Ms. Diperna and her daughter prior to cognitive testing.  Cognitive Symptoms: Decreased short-term memory: Denied. Ms. Verret stated that she may "once in a while not remember something" but denied any significant changes or concerns. Her daughter expressed significant concerns surrounding memory, stating that her mother will forget things very rapidly, repeat herself quite often, and misplace items around her home often. Memory decline was said to be progressive during the past 1-2 years.  Decreased long-term memory: Denied. Decreased attention/concentration: Denied. Reduced processing speed: Denied. Difficulties with executive functions: Denied. She reported it being "not easy being organized" but did not report this to be a significant change or have explicit concerns surrounding disorganization. She denied trouble with impulsivity or any significant personality changes.  Difficulties with emotion regulation:  Denied. Difficulties with receptive language: Denied. Difficulties with word finding: Denied. However, she did allude to word finding difficulties occurring more frequently when she is anxious or stressed.  Decreased visuoperceptual ability: Denied.  Difficulties completing ADLs: Endorsed. When asked, Ms. Carby denied taking any medications at all. When her daughter reminded her that she takes several, she seemed genuinely surprised by this. Her husband and daughters are responsible for medication management, financial management, and bill paying responsibilities. She does not drive, at least in part due to cognitive decline. There have been instances where she has left the stove on, leading to concerns surrounding cooking. She reported being able to care for her cats. However, at one point during the interview, she seemingly forgot how many cats she has.   Additional Medical History: History of traumatic brain injury/concussion: Denied. History of stroke: Denied. History of seizure activity: Denied. History of known exposure to toxins: Denied. Symptoms of chronic pain: Endorsed. Her daughter reported episodes of abdominal pain due to her history of chronic colitis. Outside of this, Ms. Kazanjian did not report significant, consistent pain symptoms.  Experience of frequent headaches/migraines: Denied. Headaches were said to occur infrequently.  Frequent instances of dizziness/vertigo: Denied. Symptoms were said to occur infrequently but can impact balance stability.   Sensory changes: She described a history of a blood vessel breaking underneath the surface of her eye, which has created some mild visual disturbances. She reported blurred vision from time to time. Her  daughter wondered about mild hearing loss. Other sensory changes/difficulties (e.g., taste or smell) were denied.  Balance/coordination difficulties: Denied. However, her daughter reported ongoing instability and that her mother very often  uses furniture or other things in her environment to stabilize herself as she walks. Her daughter noted that Ms. Pryer often forgets to use her cane or Cynthia Perry. They denied any recent falls.   Other motor difficulties: Endorsed. She reported mild, bilateral tremors in her upper extremities, which she attributed to advanced age. Her daughter noted that her handwriting has become less neat due to this.   Sleep History: Estimated hours obtained each night: Unclear. Ms. Carrell was unable to provide a numerical estimation.  Difficulties falling asleep: Unclear. Difficulties staying asleep: Unclear. Feels rested and refreshed upon awakening: Denied. Her daughter noted that Ms. Gilani will nap during the day and she will comment that she feels fatigued during the day.   History of snoring: Denied. History of waking up gasping for air: Denied. Witnessed breath cessation while asleep: Denied.  History of vivid dreaming: Endorsed. However, per Ms. Notte's reporting, it was unclear if this represented remote behaviors or symptoms that were currently present.  Excessive movement while asleep: Denied. Instances of acting out her dreams: Denied.  Psychiatric/Behavioral Health History: Depression: Denied. Ms. Porchia denied any prior depression-related mental health concerns or diagnoses to her knowledge. Current or remote suicidal ideation, intent, or plan was denied. Anxiety: Denied. However, her daughter acknowledged that her mother has likely a longstanding history of generalized anxiety symptoms.  Mania: Denied. Trauma History: Denied. Visual/auditory hallucinations: Denied. Delusional thoughts: Unclear. Her daughter provided examples of several odd thought processes. Her mother once thought that she was in a new home or environment when walking out her front door. Ms. Lobb also reported making comments about her grandchild coming home from a birthday party when she witnessed him getting off the school bus.    Tobacco: Denied. Alcohol: She reported very rare alcohol consumption and denied a history of problematic alcohol abuse or dependence.  Recreational drugs: Denied.  Family History: Problem Relation Age of Onset   Kidney disease Mother    This information was confirmed by Ms. Buccieri.  Academic/Vocational History: Highest level of educational attainment: 12 years. She could not recall what sort of grades she made while in school settings. Her daughter described her as an average student, with math being a likely relative weakness.  History of developmental delay: Denied. History of grade repetition: Denied. Enrollment in special education courses: Denied. History of LD/ADHD: Denied.  Employment: Retired. 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   Evaluation Results:   Behavioral Observations: Ms. Lippold was accompanied by her daughter, arrived to her appointment on time, and was appropriately dressed and groomed. She appeared alert. Her daughter stabilized her and provided assistance as they walked back to my office. Mild tremors were observed in her hands bilaterally. Her affect was generally relaxed and positive. Spontaneous speech was fluent and word finding difficulties were not observed during the clinical interview. Thought processes were often confused. There were times where she seemed genuinely surprised when her daughter would provide historical information (such as her having two cats or her taking various medications). Insight into her cognitive difficulties appeared very limited and I do not believe she has an appropriate appreciation for the extent of ongoing cognitive and functional impairment.   During testing, Ms. Stoneberg fatigued quickly. She also expressed notable confusion  while attempting to complete early tasks. Due to the combination of these factors, she quickly expressed her intention to discontinue the evaluation,  stating that "I need to leave and go home." As this was prior to the start of memory testing, the proposed testing battery was notably abbreviated and certain tasks were performed outside of their normal ordering. Overall, she was able to complete an adequate amount of testing for diagnostic purposes despite testing's abbreviated nature.    Adequacy of Effort: The validity of neuropsychological testing is limited by the extent to which the individual being tested may be assumed to have exerted adequate effort during testing. Ms. Kuba expressed her intention to perform to the best of her abilities. As such, the results of the current evaluation are believed to be a valid representation of Ms. Vila's current cognitive functioning.  Test Results: Ms. Jump was poorly oriented at the time of the current evaluation. She was unable to state her age and could not recall her address or phone number. She was unable to state the current year, month, date, day of the week ("Saturday") or time. She was unsure of the name of the current clinic or what city she was currently located in. When asked why she was here today, she responded with "I don't know."   Intellectual abilities based upon educational and vocational attainment were estimated to be in the average range. Premorbid abilities were estimated to be within the below average range based upon a single-word reading test.   Processing speed was exceptionally low. Basic attention was unable to be assessed. More complex attention (e.g., working memory) was also unable to be assessed. Cognitive flexibility was exceptionally low. Additional facets of executive functioning was unable to be assessed. Safety/judgment was unable to be assessed.  Assessed receptive language abilities were unable to be assessed. Assessed expressive language (e.g., verbal fluency) was exceptionally low. Confrontation naming was unable to be assessed.     Assessed  visuospatial/visuoconstructional abilities were exceptionally low. When drawing a clock, she was able to draw the outer circle. When placing numbers, she placed the number 10 where 12 would normally be, and then the number 11 to the left of the number 10. She was unable to provide any additional numbers. She was also confused when asked to place the clock hands. When copying a complex figure, she was unable to complete the outer rectangle or diagonal lines. A majority of internal aspects where either notably distorted or omitted entirely.    Learning (i.e., encoding) of novel verbal information was exceptionally low. Spontaneous delayed recall (i.e., retrieval) of previously learned information was also exceptionally low. Retention rates were 0% across a story learning task and 0% across a figure drawing task.   Results of emotional screening instruments suggested that recent symptoms of generalized anxiety were in the moderate range, while symptoms of depression were also within the moderate range. A screening instrument assessing recent sleep quality suggested the presence of moderate sleep dysfunction.  Tables of Scores:   Note: This summary of test scores accompanies the interpretive report and should not be considered in isolation without reference to the appropriate sections in the text. Descriptors are based on appropriate normative data and may be adjusted based on clinical judgment. Terms such as "Within Normal Limits" and "Outside Normal Limits" are used when a more specific description of the test score cannot be determined.       Percentile - Normative Descriptor > 98 - Exceptionally High 91-97 - Well Above  Average 75-90 - Above Average 25-74 - Average 9-24 - Below Average 2-8 - Well Below Average < 2 - Exceptionally Low       Validity:   DESCRIPTOR       Dot Counting Test: --- --- Discontinued       Orientation:      Raw Score Percentile   NAB Orientation, Form 1 9/29 --- ---        Cognitive Screening:      Raw Score Percentile   SLUMS: 3/30 --- ---       RBANS, Form A: Standard Score/ Scaled Score Percentile   Total Score --- --- ---  Immediate Memory --- --- ---  List Learning Discontinued --- ---  Story Memory 1 <1 Exceptionally Low  Visuospatial/Constructional --- --- ---  Figure Copy 1 <1 Exceptionally Low  Line Orientation --- --- ---  Language --- --- ---  Picture Naming --- --- ---  Semantic Fluency --- --- ---  Attention --- --- ---  Digit Span --- --- ---  Coding --- --- ---  Delayed Memory --- --- ---  List Recall --- --- ---  List Recognition --- --- ---  Story Recall 1 <1 Exceptionally Low  Story Recognition --- --- ---  Figure Recall 1 <1 Exceptionally Low  Figure Recognition --- --- ---       Intellectual Functioning:      Standard Score Percentile   Test of Premorbid Functioning: 85 16 Below Average       Attention/Executive Function:     Trail Making Test (TMT): Raw Score (T Score) Percentile   Part A 268 secs.,  0 errors (21) <1 Exceptionally Low  Part B Discontinued --- Impaired        Language:     Verbal Fluency Test: Raw Score (T Score) Percentile   Phonemic Fluency (FAS) 9 (19) <1 Exceptionally Low  Animal Fluency 2 (12) <1 Exceptionally Low        Visuospatial/Visuoconstruction:      Raw Score Percentile   Clock Drawing: 2/10 --- Impaired       Mood and Personality:      Raw Score Percentile   PROMIS Depression Questionnaire: 27 --- Moderate  PROMIS Anxiety Questionnaire: 22 --- Moderate       Additional Questionnaires:      Raw Score Percentile   PROMIS Sleep Disturbance Questionnaire: 31 --- Moderate   Informed Consent and Coding/Compliance:   The current evaluation represents a clinical evaluation for the purposes previously outlined by the referral source and is in no way reflective of a forensic evaluation.   Ms. Liptak was provided with a verbal description of the nature and purpose of the present  neuropsychological evaluation. Also reviewed were the foreseeable risks and/or discomforts and benefits of the procedure, limits of confidentiality, and mandatory reporting requirements of this provider. The patient was given the opportunity to ask questions and receive answers about the evaluation. Oral consent to participate was provided by the patient.   This evaluation was conducted by Christia Reading, Ph.D., ABPP-CN, board certified clinical neuropsychologist. Ms. Reveles completed a clinical interview with Dr. Melvyn Novas, billed as one unit [phone], and 75 minutes of cognitive testing and scoring, billed as one unit [phone] and two additional units [id number]. Psychometrist Milana Kidney, B.S., assisted Dr. Melvyn Novas with test administration and scoring procedures. As a separate and discrete service, Dr. Melvyn Novas spent a total of 160 minutes in interpretation and report writing billed as one unit [phone] and two units  96133. ° °  °  °

## 2020-12-30 NOTE — Progress Notes (Signed)
° °  Psychometrician Note   Cognitive testing was administered to Cynthia Perry by Wallace Keller, B.S. (psychometrist) under the supervision of Dr. Newman Nickels, Ph.D., licensed psychologist on 12/30/20. Cynthia Perry did not appear overtly distressed by the testing session per behavioral observation or responses across self-report questionnaires. Rest breaks were offered.    The battery of tests administered was selected by Dr. Newman Nickels, Ph.D. with consideration to Cynthia Perry's current level of functioning, the nature of her symptoms, emotional and behavioral responses during interview, level of literacy, observed level of motivation/effort, and the nature of the referral question. This battery was communicated to the psychometrist. Communication between Dr. Newman Nickels, Ph.D. and the psychometrist was ongoing throughout the evaluation and Dr. Newman Nickels, Ph.D. was immediately accessible at all times. Dr. Newman Nickels, Ph.D. provided supervision to the psychometrist on the date of this service to the extent necessary to assure the quality of all services provided.    Cynthia Perry will return within approximately 1-2 weeks for an interactive feedback session with Dr. Milbert Coulter at which time her test performances, clinical impressions, and treatment recommendations will be reviewed in detail. Cynthia Perry understands she can contact our office should she require our assistance before this time.  A total of 75 minutes of billable time were spent face-to-face with Cynthia Perry by the psychometrist. This includes both test administration and scoring time. Billing for these services is reflected in the clinical report generated by Dr. Newman Nickels, Ph.D.  This note reflects time spent with the psychometrician and does not include test scores or any clinical interpretations made by Dr. Milbert Coulter. The full report will follow in a separate note.

## 2021-01-07 ENCOUNTER — Ambulatory Visit (INDEPENDENT_AMBULATORY_CARE_PROVIDER_SITE_OTHER): Payer: Medicare Other | Admitting: Psychology

## 2021-01-07 ENCOUNTER — Other Ambulatory Visit: Payer: Self-pay

## 2021-01-07 DIAGNOSIS — F0280 Dementia in other diseases classified elsewhere without behavioral disturbance: Secondary | ICD-10-CM | POA: Diagnosis not present

## 2021-01-07 DIAGNOSIS — F411 Generalized anxiety disorder: Secondary | ICD-10-CM

## 2021-01-07 DIAGNOSIS — G309 Alzheimer's disease, unspecified: Secondary | ICD-10-CM

## 2021-01-07 NOTE — Progress Notes (Signed)
° °  Neuropsychology Feedback Session [name]. [facility] Department of Neurology  Reason for Referral:   Cynthia Perry is a 76 y.o. left-handed Caucasian female referred by  Marlowe Kays, PA-C , to characterize her current cognitive functioning and assist with diagnostic clarity and treatment planning in the context of subjective cognitive decline and concern for a neurodegenerative illness.   Feedback:   [name] completed a comprehensive neuropsychological evaluation on 12/30/2020. Please refer to that encounter for the full report and recommendations. Across completed tasks, [name] pattern of performance is suggestive of severe, diffuse cognitive impairment. All domains able to be assessed (i.e., processing speed, cognitive flexibility, verbal fluency, visuoconstructional abilities, and learning and memory) exhibited severe impairment with unfortunately no tasks approaching adequate normative performances. The most likely etiology for ongoing dysfunction at the present time is unfortunately Alzheimer's disease. Despite limited testing tolerance, [name] was fully amnestic after a delay of approximately five minutes across both story and figure-based memory tasks. When asked to recall any information she remembered, she made comments not recalling being read a story previously or that she had attempted to copy a complex figure. Overall, this pattern of memory impairment suggests the presence of rapid forgetting and a severe memory storage deficit, which are hallmark characteristics of Alzheimer's disease.  [name] was accompanied by her daughter during the current feedback session. Content of the current session focused on the results of her neuropsychological evaluation. [name] was given the opportunity to ask questions and her questions were answered. She was encouraged to reach out should additional questions arise. A copy of her report was provided at the  conclusion of the visit.      20 minutes were spent conducting the current feedback session with [name], billed as one unit [phone].

## 2021-01-21 ENCOUNTER — Ambulatory Visit (INDEPENDENT_AMBULATORY_CARE_PROVIDER_SITE_OTHER): Payer: Medicare Other | Admitting: Physician Assistant

## 2021-01-21 ENCOUNTER — Encounter: Payer: Self-pay | Admitting: Physician Assistant

## 2021-01-21 ENCOUNTER — Other Ambulatory Visit: Payer: Self-pay

## 2021-01-21 VITALS — BP 125/80 | HR 88 | Resp 18 | Ht 63.0 in | Wt 123.0 lb

## 2021-01-21 DIAGNOSIS — F0280 Dementia in other diseases classified elsewhere without behavioral disturbance: Secondary | ICD-10-CM | POA: Diagnosis not present

## 2021-01-21 DIAGNOSIS — G309 Alzheimer's disease, unspecified: Secondary | ICD-10-CM

## 2021-01-21 MED ORDER — MEMANTINE HCL 5 MG PO TABS: ORAL_TABLET | ORAL | 3 refills | Status: DC

## 2021-01-21 NOTE — Progress Notes (Signed)
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  In view of her history of ulcerative colitis with intermittent flares with diarrhea, agents like Aricept are not indicated at this time, as the main side effect may be diarrhea.  Introducing memantine at the lower dose initially, may be the more appropriate route with the goal of delaying the progression of the disease.    Recommendations:  Discussed safety both in and out of the home.  Discussed the importance of regular daily schedule to maintain brain function.  Continue to monitor mood by PCP Stay active at least 30 minutes at least 3 times a week.  Naps should be scheduled and should be no longer than 60 minutes and should not occur after 2 PM.  Mediterranean diet is recommended  Control cardiovascular risk factors  Start Memantine 5 mg, take 1 tablet nightly, then increase after 2 weeks to 5 mg twice daily if tolerated.  Will monitor for any side effects which were discussed. Follow up in 3 months.   Case discussed with Dr. Tomi Likens who agrees with the plan     Subjective:    Cynthia Perry is a very pleasant 76 y.o. RH female with a history of hyperlipidemia, UC on prednisone daily, recent hypercalcemia, ASCVD, B12 deficiency, anxiety, depression, and the new diagnosis of major neurocognitive disorder due to Alzheimer's disease with behavioral disturbance seen today in follow up for memory loss. This patient is accompanied in the office by her daughter who supplements the history.  Previous records as well as any outside records available were reviewed prior to todays visit.  Patient was last seen at our office on 07/27/20 at which time her MoCA was 8.  For hallucinations and insomnia she was taking Depakote 125 mg nightly, which was discontinued  feeling that this was not needed.  Currently, she is not on antidementia medications.  In today's visit, the patient reports that her memory is about the same.  Daughter agrees.  She may ask the same questions more frequently.  She denies feeling disoriented when coming to the room.  She may be hoarding socks, which she places in her purse instead of on her feet.  She may hallucinate but not as before.  She occasionally sees flying cats.  She tries to stay busy, and on school days, her grandson rides the bike after school to stay with her and they watch TV together which seems to be very therapeutic for her.  She sleeps better, she continues to have vivid dreams, but no sleepwalking.  There are no true hygiene concerns, she continues to bathe once a week, because she forgets to initiate it herself.  She is able to dress by herself.  She does not cook, uses the microwave at times, but her husband has been doing most of the cooking.  Her appetite is good, denies trouble swallowing.  Her daughter helps at times with meals, and house chores, trying to keep the house clean.  She uses a cane on the left, not using the walker yet.  She no longer drives.  Her daughter is in charge of the finances.  She denies any headaches, falls, head injuries, double vision, dizziness, focal numbness or tingling, unilateral weakness.  She has mild bilateral tremors, which are not worse than before, she denies dropping objects.  She denies urine incontinence or retention.  Denies constipation.  She has flares of ulcerative colitis, last in November of last year.  Apparently, she was not taking her medications properly, which led to a flare.  This has been adjusted now.  He denies anosmia.   Initial Visit 07/27/20 [name] seen in neurologic consultation at the request of Libby Maw,* for the evaluation of memory.  The patient is accompanied by daughter who supplements the history.  This patient is accompanied in the  office by her daughter who supplements the history.  Previous records as well as any outside records available were reviewed prior to todays visit.      She is a  very pleasant 76 y.o. year old  [place] female who has had memory issues for about 1 year, worse over the last 6 months. It began with some depression and difficulty concentrating. Over the last 2 months, it progressed to inability to  remember the medicines to take, and misplacing them. Daughter reports that she found some meds on a plastic bag, some in her pocket. Now, she is monitored daily and the medicines are in a pillbox, "seems to help".  She has also been more disoriented. She  sees dead family members and at times does not recognize the place she lives. She has left cat food on the counter, but forgot to feed her cat.  Misplaces objects, such as keys to the safe, found in her pants pocket. She constantly moves objects from place to place.  Her mood has been changing as well. She is scared at night with vivid dreams, but not sleepwalking; she does have auditory hallucinations.  She has some paranoia about her cats, and she calls her children to make sure that they did not run away. Denies irritability. She has been less mobile, and other than watching TV and being with her husband, does not participate in any adult activities/programs.  She forgets to bathe, but she is able to dress by herself. She does not cook, only uses the microwave although she has forgotten how to use it.  She had left an egg on the stove and forgot to use the pan recently, for which her daughter felt cooking was unsafe for her. Her daughter helps with the meals and with other house chores, trying to keep the house clean, because she finds these chores more difficult . Appetite is good without trouble swallowing or sialorrhea. Ambulates  with a L cane, entertaining to start using a walker.  Patient no longer drives.  Daughter assists with the bill payments. Denies headaches,  falls, or injuries to the head, double vision, dizziness, focal numbness or tingling, unilateral weakness . She has bilateral tremors, but not dropping objects. Denies sialorrhea.  Denies urine incontinence or retention. Denies constipation or diarrhea. Denies anosmia. Denies history of OSA, ETOH  Tobacco. Family History negative for dementia        TSH normal 0.68 B12  >1550  (f/u by PCP)  LDL elevated  170  TC 274, VLDL 50   Ca 11.8 ( pt dehydrated per chart, they will repeat)     Neurocognitive diagnosis 12/30/2020 "Ms. Gurr's pattern of performance is suggestive of severe, diffuse cognitive impairment. All domains able to be assessed (i.e., processing speed, cognitive flexibility, verbal fluency, visuoconstructional abilities, and learning and memory) exhibited severe impairment with unfortunately no tasks approaching adequate normative performances. The most likely etiology for ongoing dysfunction at the present time is unfortunately Alzheimer's disease. Despite limited testing tolerance, Ms. Kulla was  fully amnestic after a delay of approximately five minutes across both story and figure-based memory tasks. When asked to recall any information she remembered, she made comments not recalling being read a story previously or that she had attempted to copy a complex figure. Overall, this pattern of memory impairment suggests the presence of rapid forgetting and a severe memory storage deficit, which are hallmark characteristics of Alzheimer's disease.  "  PREVIOUS MEDICATIONS:   CURRENT MEDICATIONS:  Outpatient Encounter Medications as of 01/21/2021  Medication Sig   acetaminophen (TYLENOL) 325 MG tablet Take 650 mg by mouth every 6 (six) hours as needed.   budesonide (ENTOCORT EC) 3 MG 24 hr capsule 1 tablet in the morning   memantine (NAMENDA) 5 MG tablet Take 1 tablet (5mg  at night) for 2 weeks, then increase to 1 tablet (5mg ) twice a day   mesalamine (APRISO) 0.375 g 24 hr capsule TAKE 4  CAPSULES BY MOUTH EVERY DAY IN THE MORNING   mupirocin 2% oint-hydrocortisone 2.5% cream-nystatin cream-zinc oxide 13% oint 1:1:1:5 mixture apply   vitamin B-12 (CYANOCOBALAMIN) 500 MCG tablet Take 500 mcg by mouth daily.   Adalimumab (HUMIRA PEN) 40 MG/0.4ML PNKT RX#3 INJECT 1 PEN (40MG ) SUBCUTANEOUSLY EVERY OTHER WEEK STARTING ON DAY 29   divalproex (DEPAKOTE) 125 MG DR tablet TAKE 1 TABLET(125 MG) BY MOUTH AT BEDTIME   divalproex (DEPAKOTE) 125 MG DR tablet 1 tablet   escitalopram (LEXAPRO) 10 MG tablet TAKE 1 TABLET(10 MG) BY MOUTH AT BEDTIME   predniSONE (DELTASONE) 5 MG tablet Take 5 mg by mouth daily with breakfast.   vitamin B-12 (CYANOCOBALAMIN) 100 MCG tablet See admin instructions.   No facility-administered encounter medications on file as of 01/21/2021.     Objective:     PHYSICAL EXAMINATION:    VITALS:   Vitals:   01/21/21 0945  BP: 125/80  Pulse: 88  Resp: 18  SpO2: 100%  Weight: 123 lb (55.8 kg)  Height: 5\' 3"  (1.6 m)    GEN:  The patient appears stated age and is in NAD. HEENT:  Normocephalic, atraumatic.   Neurological examination:  General: NAD, well-groomed, appears stated age. Orientation: The patient is alert. Oriented to person, place and date Cranial nerves: There is good facial symmetry.The speech is fluent and clear. No aphasia or dysarthria. Fund of knowledge is reduced. Recent and remote memory are impaired. Attention and concentration are reduced.  Able to name objects and repeat phrases.  Hearing is intact to conversational tone.    Sensation: Sensation is intact to light touch throughout Motor: Strength is at least antigravity x4. Tremors: none  DTR's 2/4 in [place] Cognitive Assessment  07/27/2020  Visuospatial/ Executive (0/5) 1  Naming (0/3) 1  Attention: Read list of digits (0/2) 2  Attention: Read list of letters (0/1) 0  Attention: Serial 7 subtraction starting at 100 (0/3) 0  Language: Repeat phrase (0/2) 2  Language :  Fluency (0/1) 0  Abstraction (0/2) 0  Delayed Recall (0/5) 0  Orientation (0/6) 0  Total 6  Adjusted Score (based on education) 7   No flowsheet data found.  No flowsheet data found.     Movement examination: Tone: There is normal tone in the UE/LE Abnormal movements:  no tremor.  No myoclonus.  No asterixis.   Coordination:  There is no decremation with RAM's. Normal finger to nose  Gait and Station: The patient has difficulty arising out of a deep-seated chair without the use of the  hands. The patient's stride length is good.  Gait is cautious and narrow, balance impaired, en block turn.        Total time spent on today's visit was 40  minutes, including both face-to-face time and nonface-to-face time. Time included that spent on review of records (prior notes available to me/labs/imaging if pertinent), discussing treatment and goals, answering patient's questions and coordinating care.  Cc:  Libby Maw, MD Sharene Butters, PA-C

## 2021-01-21 NOTE — Patient Instructions (Addendum)
It was a pleasure to see you today at our office.   Recommendations:  Meds: Follow up in 3 months Start Memantine 5 mg tablets. Take 1 tablet at bedtime for 2 weeks, then 1 tablet twice daily.   Side effects include dizziness, headache, diarrhea or constipation.  Call with any questions or concerns.    RECOMMENDATIONS FOR ALL PATIENTS WITH MEMORY PROBLEMS: 1. Continue to exercise (Recommend 30 minutes of walking everyday, or 3 hours every week) 2. Increase social interactions - continue going to [place] and enjoy social gatherings with friends and family 3. Eat healthy, avoid fried foods and eat more fruits and vegetables 4. Maintain adequate blood pressure, blood sugar, and blood cholesterol level. Reducing the risk of stroke and cardiovascular disease also helps promoting better memory. 5. Avoid stressful situations. Live a simple life and avoid aggravations. Organize your time and prepare for the next day in anticipation. 6. Sleep well, avoid any interruptions of sleep and avoid any distractions in the bedroom that may interfere with adequate sleep quality 7. Avoid sugar, avoid sweets as there is a strong link between excessive sugar intake, diabetes, and cognitive impairment We discussed the Mediterranean diet, which has been shown to help patients reduce the risk of progressive memory disorders and reduces cardiovascular risk. This includes eating fish, eat fruits and green leafy vegetables, nuts like almonds and hazelnuts, walnuts, and also use olive oil. Avoid fast foods and fried foods as much as possible. Avoid sweets and sugar as sugar use has been linked to worsening of memory function.  There is always a concern of gradual progression of memory problems. If this is the case, then we may need to adjust level of care according to patient needs. Support, both to the patient and caregiver, should then be put into place.    The [employer] is here all day, every day for people  facing Alzheimers disease through our free 24/7 Helpline: [phone]. The Helpline provides reliable information and support to all those who need assistance, such as individuals living with memory loss, Alzheimer's or other dementia, caregivers, health care professionals and the public.  Our highly trained and knowledgeable staff can help you with: Understanding memory loss, dementia and Alzheimer's  Medications and other treatment options  General information about aging and brain health  Skills to provide quality care and to find the best care from professionals  Legal, financial and living-arrangement decisions Our Helpline also features: Confidential care consultation provided by master's level clinicians who can help with decision-making support, crisis assistance and education on issues families face every day  Help in a caller's preferred language using our translation service that features more than 200 languages and dialects  Referrals to local community programs, services and ongoing support     FALL PRECAUTIONS: Be cautious when walking. Scan the area for obstacles that may increase the risk of trips and falls. When getting up in the mornings, sit up at the edge of the bed for a few minutes before getting out of bed. Consider elevating the bed at the head end to avoid drop of blood pressure when getting up. Walk always in a well-lit room (use night lights in the walls). Avoid area rugs or power cords from appliances in the middle of the walkways. Use a walker or a cane if necessary and consider physical therapy for balance exercise. Get your eyesight checked regularly.  FINANCIAL OVERSIGHT: Supervision, especially oversight when making financial decisions or transactions is also recommended.  HOME SAFETY:  Consider the safety of the kitchen when operating appliances like stoves, microwave oven, and blender. Consider having supervision and share cooking responsibilities until no  longer able to participate in those. Accidents with firearms and other hazards in the house should be identified and addressed as well.   ABILITY TO BE LEFT ALONE: If patient is unable to contact 911 operator, consider using LifeLine, or when the need is there, arrange for someone to stay with patients. Smoking is a fire hazard, consider supervision or cessation. Risk of wandering should be assessed by caregiver and if detected at any point, supervision and safe proof recommendations should be instituted.  MEDICATION SUPERVISION: Inability to self-administer medication needs to be constantly addressed. Implement a mechanism to ensure safe administration of the medications.   DRIVING: Regarding driving, in patients with progressive memory problems, driving will be impaired. We advise to have someone else do the driving if trouble finding directions or if minor accidents are reported. Independent driving assessment is available to determine safety of driving.     Mediterranean Diet A Mediterranean diet refers to food and lifestyle choices that are based on the traditions of countries located on the [employer]. This way of eating has been shown to help prevent certain conditions and improve outcomes for people who have chronic diseases, like kidney disease and heart disease. What are tips for following this plan? Lifestyle  Cook and eat meals together with your family, when possible. Drink enough fluid to keep your urine clear or pale yellow. Be physically active every day. This includes: Aerobic exercise like running or swimming. Leisure activities like gardening, walking, or housework. Get 7-8 hours of sleep each night. If recommended by your health care provider, drink red wine in moderation. This means 1 glass a day for nonpregnant women and 2 glasses a day for men. A glass of wine equals 5 oz (150 mL). Reading food labels  Check the serving size of packaged foods. For foods such as  rice and pasta, the serving size refers to the amount of cooked product, not dry. Check the total fat in packaged foods. Avoid foods that have saturated fat or trans fats. Check the ingredients list for added sugars, such as corn syrup. Shopping  At the grocery store, buy most of your food from the areas near the walls of the store. This includes: Fresh fruits and vegetables (produce). Grains, beans, nuts, and seeds. Some of these may be available in unpackaged forms or large amounts (in bulk). Fresh seafood. Poultry and eggs. Low-fat dairy products. Buy whole ingredients instead of prepackaged foods. Buy fresh fruits and vegetables in-season from local farmers markets. Buy frozen fruits and vegetables in resealable bags. If you do not have access to quality fresh seafood, buy precooked frozen shrimp or canned fish, such as tuna, salmon, or sardines. Buy small amounts of raw or cooked vegetables, salads, or olives from the deli or salad bar at your store. Stock your pantry so you always have certain foods on hand, such as olive oil, canned tuna, canned tomatoes, rice, pasta, and beans. Cooking  Cook foods with extra-virgin olive oil instead of using butter or other vegetable oils. Have meat as a side dish, and have vegetables or grains as your main dish. This means having meat in small portions or adding small amounts of meat to foods like pasta or stew. Use beans or vegetables instead of meat in common dishes like chili or lasagna. Experiment with different cooking methods. Try roasting or broiling  vegetables instead of steaming or sauteing them. Add frozen vegetables to soups, stews, pasta, or rice. Add nuts or seeds for added healthy fat at each meal. You can add these to yogurt, salads, or vegetable dishes. Marinate fish or vegetables using olive oil, lemon juice, garlic, and fresh herbs. Meal planning  Plan to eat 1 vegetarian meal one day each week. Try to work up to 2 vegetarian  meals, if possible. Eat seafood 2 or more times a week. Have healthy snacks readily available, such as: Vegetable sticks with hummus. Greek yogurt. Fruit and nut trail mix. Eat balanced meals throughout the week. This includes: Fruit: 2-3 servings a day Vegetables: 4-5 servings a day Low-fat dairy: 2 servings a day Fish, poultry, or lean meat: 1 serving a day Beans and legumes: 2 or more servings a week Nuts and seeds: 1-2 servings a day Whole grains: 6-8 servings a day Extra-virgin olive oil: 3-4 servings a day Limit red meat and sweets to only a few servings a month What are my food choices? Mediterranean diet Recommended Grains: Whole-grain pasta. Brown rice. Bulgar wheat. Polenta. Couscous. Whole-wheat bread. Orpah Cobbatmeal. Quinoa. Vegetables: Artichokes. Beets. Broccoli. Cabbage. Carrots. Eggplant. Green beans. Chard. Kale. Spinach. Onions. Leeks. Peas. Squash. Tomatoes. Peppers. Radishes. Fruits: Apples. Apricots. Avocado. Berries. Bananas. Cherries. Dates. Figs. Grapes. Lemons. Melon. Oranges. Peaches. Plums. Pomegranate. Meats and other protein foods: Beans. Almonds. Sunflower seeds. Pine nuts. Peanuts. Cod. Salmon. Scallops. Shrimp. Tuna. Tilapia. Clams. Oysters. Eggs. Dairy: Low-fat milk. Cheese. Greek yogurt. Beverages: Water. Red wine. Herbal tea. Fats and oils: Extra virgin olive oil. Avocado oil. Grape seed oil. Sweets and desserts: [place] yogurt with honey. Baked apples. Poached pears. Trail mix. Seasoning and other foods: Basil. Cilantro. Coriander. Cumin. Mint. Parsley. Sage. Rosemary. Tarragon. Garlic. Oregano. Thyme. Pepper. Balsalmic vinegar. Tahini. Hummus. Tomato sauce. Olives. Mushrooms. Limit these Grains: Prepackaged pasta or rice dishes. Prepackaged cereal with added sugar. Vegetables: Deep fried potatoes (french fries). Fruits: Fruit canned in syrup. Meats and other protein foods: Beef. Pork. Lamb. Poultry with skin. Hot dogs. Tomasa BlaseBacon. Dairy: Ice cream. Sour cream.  Whole milk. Beverages: Juice. Sugar-sweetened soft drinks. Beer. Liquor and spirits. Fats and oils: Butter. Canola oil. Vegetable oil. Beef fat (tallow). Lard. Sweets and desserts: Cookies. Cakes. Pies. Candy. Seasoning and other foods: Mayonnaise. Premade sauces and marinades. The items listed may not be a complete list. Talk with your dietitian about what dietary choices are right for you. Summary The Mediterranean diet includes both food and lifestyle choices. Eat a variety of fresh fruits and vegetables, beans, nuts, seeds, and whole grains. Limit the amount of red meat and sweets that you eat. Talk with your health care provider about whether it is safe for you to drink red wine in moderation. This means 1 glass a day for nonpregnant women and 2 glasses a day for men. A glass of wine equals 5 oz (150 mL). This information is not intended to replace advice given to you by your health care provider. Make sure you discuss any questions you have with your health care provider. Document Released: 08/13/2015 Document Revised: 09/15/2015 Document Reviewed: 08/13/2015 Elsevier Interactive Patient Education  2017 [employer]    3

## 2021-02-01 ENCOUNTER — Telehealth: Payer: Self-pay | Admitting: Family Medicine

## 2021-02-01 NOTE — Telephone Encounter (Signed)
Left message for patient's daughter, [name],  to call back and schedule Medicare Annual Wellness Visit (AWV) in office.   If not able to come in office, please offer to do virtually or by telephone.  Left office number and my jabber [phone].  Due for AWVI  Please schedule at anytime with Nurse Health Advisor.

## 2021-03-10 ENCOUNTER — Encounter: Payer: Medicare Other | Admitting: Psychology

## 2021-04-02 ENCOUNTER — Telehealth: Payer: Self-pay | Admitting: Family Medicine

## 2021-04-02 NOTE — Telephone Encounter (Signed)
Left message for patient to call back and schedule Medicare Annual Wellness Visit (AWV) in office.  ° °If not able to come in office, please offer to do virtually or by telephone.  Left office number and my jabber #[phone]. ° °Due for AWVI ° °Please schedule at anytime with Nurse Health Advisor. °  °

## 2021-04-13 DIAGNOSIS — K625 Hemorrhage of anus and rectum: Secondary | ICD-10-CM | POA: Diagnosis not present

## 2021-04-13 DIAGNOSIS — K6289 Other specified diseases of anus and rectum: Secondary | ICD-10-CM | POA: Diagnosis not present

## 2021-04-21 ENCOUNTER — Telehealth: Payer: Self-pay | Admitting: Physician Assistant

## 2021-04-21 ENCOUNTER — Other Ambulatory Visit: Payer: Self-pay

## 2021-04-21 MED ORDER — MEMANTINE HCL 5 MG PO TABS: ORAL_TABLET | ORAL | 3 refills | Status: DC

## 2021-04-21 NOTE — Telephone Encounter (Signed)
Patients daughter [name] called regarding a refill on her mothers medicine. She doesn't remember what its called, but she has not started it yet. She resch her appt due to her not starting the medication. ?[facility] on [address] ?

## 2021-04-22 ENCOUNTER — Ambulatory Visit: Payer: Medicare Other | Admitting: Physician Assistant

## 2021-04-26 ENCOUNTER — Telehealth: Payer: Self-pay | Admitting: Family Medicine

## 2021-04-26 NOTE — Telephone Encounter (Signed)
Left message for patient to call back and schedule Medicare Annual Wellness Visit (AWV). Please offer to do virtually or by telephone.  Left office number and my jabber [phone]. ? ?Due for AWVI ? ?Please schedule at anytime with Nurse Health Advisor. ?  ?

## 2021-05-10 ENCOUNTER — Encounter: Payer: Self-pay | Admitting: Family Medicine

## 2021-05-10 ENCOUNTER — Ambulatory Visit (INDEPENDENT_AMBULATORY_CARE_PROVIDER_SITE_OTHER): Payer: Medicare Other | Admitting: Family Medicine

## 2021-05-10 VITALS — BP 108/66 | HR 72 | Temp 97.5°F | Ht 63.0 in | Wt 110.2 lb

## 2021-05-10 DIAGNOSIS — E538 Deficiency of other specified B group vitamins: Secondary | ICD-10-CM

## 2021-05-10 DIAGNOSIS — R413 Other amnesia: Secondary | ICD-10-CM

## 2021-05-10 DIAGNOSIS — R634 Abnormal weight loss: Secondary | ICD-10-CM | POA: Diagnosis not present

## 2021-05-10 DIAGNOSIS — K117 Disturbances of salivary secretion: Secondary | ICD-10-CM | POA: Insufficient documentation

## 2021-05-10 LAB — CBC
HCT: 40.1 % (ref 36.0–46.0)
Hemoglobin: 13.5 g/dL (ref 12.0–15.0)
MCHC: 33.6 g/dL (ref 30.0–36.0)
MCV: 83.8 fl (ref 78.0–100.0)
Platelets: 427 10*3/uL — ABNORMAL HIGH (ref 150.0–400.0)
RBC: 4.78 Mil/uL (ref 3.87–5.11)
RDW: 14.3 % (ref 11.5–15.5)
WBC: 9.7 10*3/uL (ref 4.0–10.5)

## 2021-05-10 LAB — BASIC METABOLIC PANEL
BUN: 9 mg/dL (ref 6–23)
CO2: 29 mEq/L (ref 19–32)
Calcium: 10.5 mg/dL (ref 8.4–10.5)
Chloride: 98 mEq/L (ref 96–112)
Creatinine, Ser: 0.67 mg/dL (ref 0.40–1.20)
GFR: 84.3 mL/min (ref >60.00)
Glucose, Bld: 92 mg/dL (ref 70–99)
Potassium: 3.6 mEq/L (ref 3.5–5.1)
Sodium: 135 mEq/L (ref 135–145)

## 2021-05-10 NOTE — Progress Notes (Signed)
? ?Established Patient Office Visit ? ?Subjective   ?Patient ID: Cynthia Perry, female    DOB: 12-May-1945  Age: 76 y.o. MRN: [id number] ? ?Chief Complaint  ?Patient presents with  ? mouth  ?  Issues with mouth feels like she constantly has to clean her mouth, no pain. Patient fasting.   ? ? ?HPI for follow-up.  Accompanied by her daughter.  Continues to live with her husband with close follow-up by family members.  Son is planning on moving in the summer.  Complains of dry mouth after starting Namenda. ? ? ? ?Review of Systems  ?Constitutional:  Negative for chills, diaphoresis, malaise/fatigue and weight loss.  ?HENT: Negative.    ?Eyes: Negative.  Negative for blurred vision and double vision.  ?Cardiovascular:  Negative for chest pain.  ?Gastrointestinal:  Negative for abdominal pain.  ?Genitourinary: Negative.   ?Musculoskeletal:  Negative for falls and myalgias.  ?Neurological:  Negative for speech change, loss of consciousness and weakness.  ?Psychiatric/Behavioral: Negative.    ? ?  ?Objective:  ?  ? ?BP 108/66 (BP Location: Left Arm, Patient Position: Sitting, Cuff Size: Normal)   Pulse 72   Temp (!) 97.5 ?F (36.4 ?C) (Temporal)   Ht 5' 3"  (1.6 m)   Wt 110 lb 3.2 oz (50 kg)   SpO2 98%   BMI 19.52 kg/m?  ?Wt Readings from Last 3 Encounters:  ?05/10/21 110 lb 3.2 oz (50 kg)  ?01/21/21 123 lb (55.8 kg)  ?07/27/20 127 lb (57.6 kg)  ? ?  ? ?Physical Exam ?Constitutional:   ?   General: She is not in acute distress. ?   Appearance: Normal appearance. She is not ill-appearing, toxic-appearing or diaphoretic.  ?HENT:  ?   Head: Normocephalic and atraumatic.  ?   Right Ear: External ear normal.  ?   Left Ear: External ear normal.  ?   Mouth/Throat:  ?   Mouth: Mucous membranes are moist.  ?   Dentition: Abnormal dentition. Dental caries present.  ?   Pharynx: Oropharynx is clear. No oropharyngeal exudate or posterior oropharyngeal erythema.  ?Eyes:  ?   General: No scleral icterus.    ?   Right eye: No  discharge.     ?   Left eye: No discharge.  ?   Extraocular Movements: Extraocular movements intact.  ?   Conjunctiva/sclera: Conjunctivae normal.  ?   Pupils: Pupils are equal, round, and reactive to light.  ?Cardiovascular:  ?   Rate and Rhythm: Normal rate and regular rhythm.  ?Pulmonary:  ?   Effort: Pulmonary effort is normal. No respiratory distress.  ?   Breath sounds: Normal breath sounds.  ?Abdominal:  ?   General: Bowel sounds are normal.  ?Musculoskeletal:  ?   Cervical back: No rigidity or tenderness.  ?Skin: ?   General: Skin is warm and dry.  ?Neurological:  ?   Mental Status: She is alert. Mental status is at baseline.  ?Psychiatric:     ?   Mood and Affect: Mood normal.     ?   Behavior: Behavior normal.  ? ? ? ?No results found for any visits on 05/10/21. ? ? ? ?The 10-year ASCVD risk score (Arnett DK, et al., 2019) is: 14.5% ? ?  ?Assessment & Plan:  ? ?Problem List Items Addressed This Visit   ? ?  ? Digestive  ? Xerostomia - Primary  ? Relevant Orders  ? Basic metabolic panel  ?  ? Other  ?  Vitamin B12 deficiency  ? Relevant Orders  ? Vitamin B12  ? Weight loss  ? Relevant Orders  ? CBC  ? Prealbumin  ? ?Other Visit Diagnoses   ? ? Memory change      ? ?  ? ? ?No follow-ups on file.  ?Encourage adequate hydration and nutrition as tolerated.  Encourage dry mouth rinses such as Biotin.  Consider follow-up with dentist. ? ?Libby Maw, MD ? ?

## 2021-05-11 LAB — PREALBUMIN: Prealbumin: 7 mg/dL — ABNORMAL LOW (ref 17–34)

## 2021-05-11 LAB — VITAMIN B12: Vitamin B-12: 735 pg/mL (ref 211–911)

## 2021-05-25 ENCOUNTER — Encounter: Payer: Self-pay | Admitting: Psychology

## 2021-05-25 ENCOUNTER — Ambulatory Visit (INDEPENDENT_AMBULATORY_CARE_PROVIDER_SITE_OTHER): Payer: Medicare Other | Admitting: Physician Assistant

## 2021-05-25 ENCOUNTER — Encounter: Payer: Self-pay | Admitting: Physician Assistant

## 2021-05-25 VITALS — BP 112/80 | HR 111 | Resp 18 | Wt 97.0 lb

## 2021-05-25 DIAGNOSIS — G309 Alzheimer's disease, unspecified: Secondary | ICD-10-CM | POA: Diagnosis not present

## 2021-05-25 DIAGNOSIS — F0280 Dementia in other diseases classified elsewhere without behavioral disturbance: Secondary | ICD-10-CM

## 2021-05-25 NOTE — Progress Notes (Signed)
Assessment/Plan:   Dementia due to Alzheimer's Disease with behavioral Disturbance   Cynthia Perry is a very pleasant 76 y.o. 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 Last MoCA on 07/27/2020 was 7/30. She is on memantine 5 mg bid tolerating well.  She is stable from a memory standpoint.behaviorally, she has intermittent periods of disorientation.   Recommendations:    Continue Memantine 5 mg bid   Side effects were discussed Repeat neurocognitive testing in 1 year, as recommended by neuropsychology  follow up in 6 months.   Case discussed with Dr. Delice Lesch who agrees with the plan     Subjective:   This patient is accompanied in the office by her daughter who supplements the history.  Previous records as well as any outside records available were reviewed prior to todays visit.  Patient was last seen at our office on 01/21/2021.  Last MoCA on 07/27/2020 was 7/30. She is on memantine 5  mg bid tolerating well except for some dry mouth controlled with Biotin.   Any changes in memory since last visit?  During her UC flare, her memory becomes "a little worse ", but then after the flare she is stable.  She has moments in which she is disoriented, for example, last week, "she was sitting at the dinner table, when she got up, was on her way to start doing the dishes, and became scared because she thought that her daughter was in an accident and until her daughter did not call her to let her know that she was okay, she was having "a panic attack ".  She was aware of the situation however, she "just could not control it ". Patient lives with: Husband, who is "a old fashion and does not quite understand what is going on with  the changes in mom ". Daughter lives nearby and is very involved in her care. repeats oneself?  "A little more frequently than before". Disoriented when walking into a room? She does talk about going back to [place] sometimes, where she is from.  There are times, where she is disoriented in the house, even if she lived there for 35 years.   Leaving objects in unusual places?  For many years, this has been present, "we are yet to find her sunglasses that she lost 4 years ago ".   Ambulates  with difficulty?   Patient has a cane to ambulate but does not know how to use, she tries to lift head rather than walk with it.  She is to try a walker. See is worried about falling "she does not move much around the house and she is not interested in physical therapy".  Her daughter notices that the steps are smaller than before.  No significant shuffling.   Recent falls?  Patient denies, daughter not aware of any falls    Any head injuries?  Patient denies   History of seizures?   Patient denies   Wandering behavior?  Patient denies. She does not like to go out at all.  She has always been kind of private according to her daughter.  She has been more reclusive lately.    Patient drives?   Patient no longer drives  Hallucinations?  Patient history of hallucinations, controlled with  medications.   Paranoia?  Patient does not like to go outside "just in case someone sees her, this is not new, has been present for at least 4 years ". Patient reports that he sleeps well with vivid dreams, without REM behavior. She gets up to the bathroom and then goes back to sleep.  History of sleep apnea?  Patient denies   Any hygiene concerns?  She is afraid she is going to fall if she goes into the shower.  She does not like the sensation of water, especially the cold feeling of removing clothing and going into the shower.  She still continues to do so once a week.  She has to be reminded of changing clothes.  She needs some  assistance with dressing and bathing. Does the patient needs help with medications?  Her daughter is in charge of the medications Who is in charge of the finances?  Her daughter is in charge of the finances  any changes in appetite?  Patient denies.  She does not drink enough water. Patient have trouble swallowing? Patient denies   Does the patient cook?  Patient denies.  Her husband does most of the cooking. Any kitchen accidents such as leaving the stove on? Patient denies   Any headaches?  Patient denies   The double vision? Patient denies   Any focal numbness or tingling?  Patient denies   Chronic back pain Patient denies   Unilateral weakness?  Patient denies   Any tremors?  Patient has a history of mild bilateral tremors, left greater than right not worse from prior.  No dropping objects.  No increased salivation.  No severe foot shuffling. Any history of anosmia?  Patient denies   Any incontinence of urine? She needs the Depends only for bowel, not for urine  Any bowel dysfunction?  She has some flares of ulcerative colitis, currently undergoing 1, usually lasting 2 weeks.   Initial Visit 07/27/20 Cynthia Perry seen in neurologic consultation at the request of Libby Maw,* for the evaluation of memory.  The patient is accompanied by daughter who supplements the history.  This patient is accompanied in the office by her daughter who supplements the history.  Previous records as well as any outside records available were reviewed prior to todays visit.      She is a  very pleasant 76 y.o. year old  [place] female who has had memory issues for about 1 year, worse over the last 6 months. It began with some depression and difficulty concentrating. Over the last 2 months, it progressed to inability to  remember the medicines to take, and misplacing them. Daughter reports that she found some meds on a plastic bag, some in her pocket. Now, she is monitored daily and the medicines are in a  pillbox, "seems to help".  She has also been more disoriented. She  sees dead family members and at times does not recognize the place she lives. She has left cat food on the counter, but forgot to feed her cat.  Misplaces objects, such as keys to the safe, found in her pants pocket. She constantly moves objects from place to place.  Her mood has been changing as well. She is scared at night with vivid dreams, but not sleepwalking; she does have auditory hallucinations.  She has some paranoia about her cats, and she calls her children to make sure that they did not run away. Denies irritability. She has been less mobile, and other than  watching TV and being with her husband, does not participate in any adult activities/programs.  She forgets to bathe, but she is able to dress by herself. She does not cook, only uses the microwave although she has forgotten how to use it.  She had left an egg on the stove and forgot to use the pan recently, for which her daughter felt cooking was unsafe for her. Her daughter helps with the meals and with other house chores, trying to keep the house clean, because she finds these chores more difficult . Appetite is good without trouble swallowing or sialorrhea. Ambulates  with a L cane, entertaining to start using a walker.  Patient no longer drives.  Daughter assists with the bill payments. Denies headaches, falls, or injuries to the head, double vision, dizziness, focal numbness or tingling, unilateral weakness . She has bilateral tremors, but not dropping objects. Denies sialorrhea.  Denies urine incontinence or retention. Denies constipation or diarrhea. Denies anosmia. Denies history of OSA, ETOH  Tobacco. Family History negative for dementia        TSH normal 0.68 B12  >1550  (f/u by PCP)  LDL elevated  170  TC 274, VLDL 50   Ca 11.8 ( pt dehydrated per chart, they will repeat)      Neurocognitive diagnosis 12/30/2020 "Ms. Mehaffey's pattern of performance is suggestive  of severe, diffuse cognitive impairment. All domains able to be assessed (i.e., processing speed, cognitive flexibility, verbal fluency, visuoconstructional abilities, and learning and memory) exhibited severe impairment with unfortunately no tasks approaching adequate normative performances. The most likely etiology for ongoing dysfunction at the present time is unfortunately Alzheimer's disease. Despite limited testing tolerance, Ms. Beegle was fully amnestic after a delay of approximately five minutes across both story and figure-based memory tasks. When asked to recall any information she remembered, she made comments not recalling being read a story previously or that she had attempted to copy a complex figure. Overall, this pattern of memory impairment suggests the presence of rapid forgetting and a severe memory storage deficit, which are hallmark characteristics of Alzheimer's disease.  "  PREVIOUS MEDICATIONS:   CURRENT MEDICATIONS:  Outpatient Encounter Medications as of 05/25/2021  Medication Sig   acetaminophen (TYLENOL) 325 MG tablet Take 650 mg by mouth every 6 (six) hours as needed.   hydrocortisone (ANUSOL-HC) 2.5 % rectal cream Apply topically.   memantine (NAMENDA) 5 MG tablet Take 1 tablet (37m at night) for 2 weeks, then increase to 1 tablet (539m twice a day   mesalamine (APRISO) 0.375 g 24 hr capsule TAKE 4 CAPSULES BY MOUTH EVERY DAY IN THE MORNING   mupirocin 2% oint-hydrocortisone 2.5% cream-nystatin cream-zinc oxide 13% oint 1:1:1:5 mixture apply   Adalimumab (HUMIRA PEN) 40 MG/0.4ML PNKT RX#3 INJECT 1 PEN (40MG) SUBCUTANEOUSLY EVERY OTHER WEEK STARTING ON DAY 29 (Patient not taking: Reported on 05/10/2021)   budesonide (ENTOCORT EC) 3 MG 24 hr capsule 1 tablet in the morning (Patient not taking: Reported on 05/10/2021)   No facility-administered encounter medications on file as of 05/25/2021.        View : No data to display.            07/27/2020    9:00 AM  [facility]  Cognitive Assessment   Visuospatial/ Executive (0/5) 1  Naming (0/3) 1  Attention: Read list of digits (0/2) 2  Attention: Read list of letters (0/1) 0  Attention: Serial 7 subtraction starting at 100 (0/3) 0  Language: Repeat phrase (0/2) 2  Language : Fluency (0/1) 0  Abstraction (0/2) 0  Delayed Recall (0/5) 0  Orientation (0/6) 0  Total 6  Adjusted Score (based on education) 7    Objective:     PHYSICAL EXAMINATION:    VITALS:   Vitals:   05/25/21 1139  BP: 112/80  Pulse: (!) 111  Resp: 18  SpO2: 97%  Weight: 97 lb (44 kg)    GEN:  The patient appears stated age and is in NAD. HEENT:  Normocephalic, atraumatic.   Neurological examination:  General: NAD, somewhat disheveled.  This, appears stated age. Orientation: The patient is alert. Oriented to person, not to place or date Cranial nerves: There is good facial symmetry.The speech is fluent and clear. No aphasia or dysarthria. Fund of knowledge is reduced. Recent and remote memory are impaired. Attention and concentration are reduced.  Able to name objects and repeat phrases.  Hearing is intact to conversational tone.    Sensation: Sensation is intact to light touch throughout Motor: Strength is at least antigravity x4. DTR's 2/4 in UE/LE     Movement examination: Tone: There is normal tone in the UE/LE Abnormal movements:  mild resting and intention L>R tremor in hands .  No myoclonus.  No asterixis.   Coordination:  There is no decremation with RAM's. Normal finger to nose  Gait and Station: The patient has mild difficulty arising out of a deep-seated chair without the use of the hands. The patient's stride length is short, there is slight bending forward.  Good arm swing.  Gait is cautious and narrow.    Thank you for allowing [place] the opportunity to participate in the care of this nice patient. Please do not hesitate to contact us for any questions or concerns.   Total time spent on today's visit was 50   minutes dedicated to this patient today, preparing to see patient, examining the patient, ordering tests and/or medications and counseling the patient, documenting clinical information in the EHR or other health record, independently interpreting results and communicating results to the patient/family, discussing treatment and goals, answering patient's questions and coordinating care.  Cc:  Libby Maw, MD  Sharene Butters 05/25/2021 12:53 PM

## 2021-05-25 NOTE — Patient Instructions (Addendum)
It was a pleasure to see you today at our office.   Recommendations:  Follow up in December 1, at 11:30  Continue Memantine 5 twice daily.Side effects were discussed  Repeat Neurocognitive testing in 1 year   Whom to call:  Memory  decline, memory medications: Call out office [phone]   For psychiatric meds, mood meds: Please have your primary care physician manage these medications.   Counseling regarding caregiver distress, including caregiver depression, anxiety and issues regarding community resources, adult day care programs, adult living facilities, or memory care questions:   Feel free to contact [place], Social Worker at [phone]   For assessment of decision of mental capacity and competency:  Call Dr. Anthoney Harada, geriatric psychiatrist at [phone]  For guidance in geriatric dementia issues please call [facility] [phone]  For guidance regarding [facility] and if placement were needed at the facility, contact Arnell Asal, Social Worker tel: [phone]  If you have any severe symptoms of a stroke, or other severe issues such as confusion,severe chills or fever, etc call 911 or go to the ER as you may need to be evaluate further   Consider [facility]  [place], [place] [address] [phone]  Hours of Operation Mondays to Thursdays: 8 am to 8 pm,Fridays: 9 am to 8 pm, Saturdays: 9 am to 1 pm Sundays: Closed  [email or web address]/parks-[email or web address]-adults-[email or web address]   Feel free to visit Facebook page " [employer]" for tips of how to care for people with memory problems.         RECOMMENDATIONS FOR ALL PATIENTS WITH MEMORY PROBLEMS: 1. Continue to exercise (Recommend 30 minutes of walking everyday, or 3 hours every week) 2. Increase social interactions - continue going to [place] and enjoy social gatherings with friends and family 3. Eat  healthy, avoid fried foods and eat more fruits and vegetables 4. Maintain adequate blood pressure, blood sugar, and blood cholesterol level. Reducing the risk of stroke and cardiovascular disease also helps promoting better memory. 5. Avoid stressful situations. Live a simple life and avoid aggravations. Organize your time and prepare for the next day in anticipation. 6. Sleep well, avoid any interruptions of sleep and avoid any distractions in the bedroom that may interfere with adequate sleep quality 7. Avoid sugar, avoid sweets as there is a strong link between excessive sugar intake, diabetes, and cognitive impairment We discussed the Mediterranean diet, which has been shown to help patients reduce the risk of progressive memory disorders and reduces cardiovascular risk. This includes eating fish, eat fruits and green leafy vegetables, nuts like almonds and hazelnuts, walnuts, and also use olive oil. Avoid fast foods and fried foods as much as possible. Avoid sweets and sugar as sugar use has been linked to worsening of memory function.  There is always a concern of gradual progression of memory problems. If this is the case, then we may need to adjust level of care according to patient needs. Support, both to the patient and caregiver, should then be put into place.    The Alzheimer's Association is here all day, every day for people facing Alzheimer's disease through our free 24/7 Helpline: [phone]. The Helpline provides reliable information and support to all those who need assistance, such as individuals living with memory loss, Alzheimer's or other dementia, caregivers, health care professionals and the public.  Our highly trained and knowledgeable staff can help you with: Understanding memory loss, dementia and Alzheimer's  Medications and other treatment options  General information about aging and brain health  Skills to provide quality care and to find the best care from  professionals  Legal, financial and living-arrangement decisions Our Helpline also features: Confidential care consultation provided by master's level clinicians who can help with decision-making support, crisis assistance and education on issues families face every day  Help in a caller's preferred language using our translation service that features more than 200 languages and dialects  Referrals to local community programs, services and ongoing support     FALL PRECAUTIONS: Be cautious when walking. Scan the area for obstacles that may increase the risk of trips and falls. When getting up in the mornings, sit up at the edge of the bed for a few minutes before getting out of bed. Consider elevating the bed at the head end to avoid drop of blood pressure when getting up. Walk always in a well-lit room (use night lights in the walls). Avoid area rugs or power cords from appliances in the middle of the walkways. Use a walker or a cane if necessary and consider physical therapy for balance exercise. Get your eyesight checked regularly.  FINANCIAL OVERSIGHT: Supervision, especially oversight when making financial decisions or transactions is also recommended.  HOME SAFETY: Consider the safety of the kitchen when operating appliances like stoves, microwave oven, and blender. Consider having supervision and share cooking responsibilities until no longer able to participate in those. Accidents with firearms and other hazards in the house should be identified and addressed as well.   ABILITY TO BE LEFT ALONE: If patient is unable to contact 911 operator, consider using LifeLine, or when the need is there, arrange for someone to stay with patients. Smoking is a fire hazard, consider supervision or cessation. Risk of wandering should be assessed by caregiver and if detected at any point, supervision and safe proof recommendations should be instituted.  MEDICATION SUPERVISION: Inability to self-administer  medication needs to be constantly addressed. Implement a mechanism to ensure safe administration of the medications.   DRIVING: Regarding driving, in patients with progressive memory problems, driving will be impaired. We advise to have someone else do the driving if trouble finding directions or if minor accidents are reported. Independent driving assessment is available to determine safety of driving.   If you are interested in the driving assessment, you can contact the following:  [employer] in [place]  [place] [phone] or [phone]      [place] refers to food and lifestyle choices that are based on the traditions of countries located on the [employer]. This way of eating has been shown to help prevent certain conditions and improve outcomes for people who have chronic diseases, like kidney disease and heart disease. What are tips for following this plan? Lifestyle  Cook and eat meals together with your family, when possible. Drink enough fluid to keep your urine clear or pale yellow. Be physically active every day. This includes: Aerobic exercise like running or swimming. Leisure activities like gardening, walking, or housework. Get 7-8 hours of sleep each night. If recommended by your health care provider, drink red wine in moderation. This means 1 glass a day for nonpregnant women and 2 glasses a day for men. A glass of wine equals 5 oz (150 mL). Reading food labels  Check the serving size of packaged foods. For foods such as rice and pasta, the serving size refers to  the amount of cooked product, not dry. Check the total fat in packaged foods. Avoid foods that have saturated fat or trans fats. Check the ingredients list for added sugars, such as corn syrup. Shopping  At the grocery store, buy most of your food  from the areas near the walls of the store. This includes: Fresh fruits and vegetables (produce). Grains, beans, nuts, and seeds. Some of these may be available in unpackaged forms or large amounts (in bulk). Fresh seafood. Poultry and eggs. Low-fat dairy products. Buy whole ingredients instead of prepackaged foods. Buy fresh fruits and vegetables in-season from local farmers markets. Buy frozen fruits and vegetables in resealable bags. If you do not have access to quality fresh seafood, buy precooked frozen shrimp or canned fish, such as tuna, salmon, or sardines. Buy small amounts of raw or cooked vegetables, salads, or olives from the deli or salad bar at your store. Stock your pantry so you always have certain foods on hand, such as olive oil, canned tuna, canned tomatoes, rice, pasta, and beans. Cooking  Cook foods with extra-virgin olive oil instead of using butter or other vegetable oils. Have meat as a side dish, and have vegetables or grains as your main dish. This means having meat in small portions or adding small amounts of meat to foods like pasta or stew. Use beans or vegetables instead of meat in common dishes like chili or lasagna. Experiment with different cooking methods. Try roasting or broiling vegetables instead of steaming or sauteing them. Add frozen vegetables to soups, stews, pasta, or rice. Add nuts or seeds for added healthy fat at each meal. You can add these to yogurt, salads, or vegetable dishes. Marinate fish or vegetables using olive oil, lemon juice, garlic, and fresh herbs. Meal planning  Plan to eat 1 vegetarian meal one day each week. Try to work up to 2 vegetarian meals, if possible. Eat seafood 2 or more times a week. Have healthy snacks readily available, such as: Vegetable sticks with hummus. Greek yogurt. Fruit and nut trail mix. Eat balanced meals throughout the week. This includes: Fruit: 2-3 servings a day Vegetables: 4-5 servings a  day Low-fat dairy: 2 servings a day Fish, poultry, or lean meat: 1 serving a day Beans and legumes: 2 or more servings a week Nuts and seeds: 1-2 servings a day Whole grains: 6-8 servings a day Extra-virgin olive oil: 3-4 servings a day Limit red meat and sweets to only a few servings a month What are my food choices? Mediterranean diet Recommended Grains: Whole-grain pasta. Brown rice. Bulgar wheat. Polenta. Couscous. Whole-wheat bread. [place]. Vegetables: Artichokes. Beets. Broccoli. Cabbage. Carrots. Eggplant. Green beans. Chard. Kale. Spinach. Onions. Leeks. Peas. Squash. Tomatoes. Peppers. Radishes. Fruits: Apples. Apricots. Avocado. Berries. Bananas. Cherries. Dates. Figs. Grapes. Lemons. Melon. Oranges. Peaches. Plums. Pomegranate. Meats and other protein foods: Beans. Almonds. Sunflower seeds. Pine nuts. Peanuts. [place]. Salmon. Scallops. Shrimp. [place]. Tilapia. Clams. Oysters. Eggs. Dairy: Low-fat milk. Cheese. Greek yogurt. Beverages: Water. Red wine. Herbal tea. Fats and oils: Extra virgin olive oil. Avocado oil. Grape seed oil. Sweets and desserts: [place] yogurt with honey. Baked apples. Poached pears. Trail mix. Seasoning and other foods: Basil. Cilantro. Coriander. Cumin. Mint. Parsley. Sage. Rosemary. Tarragon. Garlic. Oregano. Thyme. Pepper. Balsalmic vinegar. Tahini. Hummus. Tomato sauce. Olives. Mushrooms. Limit these Grains: Prepackaged pasta or rice dishes. Prepackaged cereal with added sugar. Vegetables: Deep fried potatoes (french fries). Fruits: Fruit canned in syrup. Meats and other protein foods: Beef. Pork. Lamb. Poultry with  skin. Hot dogs. [place] Salines. Dairy: Ice cream. Sour cream. Whole milk. Beverages: Juice. Sugar-sweetened soft drinks. Beer. Liquor and spirits. Fats and oils: Butter. Canola oil. Vegetable oil. Beef fat (tallow). Lard. Sweets and desserts: Cookies. Cakes. Pies. Candy. Seasoning and other foods: Mayonnaise. Premade sauces and marinades. The  items listed may not be a complete list. Talk with your dietitian about what dietary choices are right for you. Summary The Mediterranean diet includes both food and lifestyle choices. Eat a variety of fresh fruits and vegetables, beans, nuts, seeds, and whole grains. Limit the amount of red meat and sweets that you eat. Talk with your health care provider about whether it is safe for you to drink red wine in moderation. This means 1 glass a day for nonpregnant women and 2 glasses a day for men. A glass of wine equals 5 oz (150 mL). This information is not intended to replace advice given to you by your health care provider. Make sure you discuss any questions you have with your health care provider. Document Released: 08/13/2015 Document Revised: 09/15/2015 Document Reviewed: 08/13/2015 Elsevier Interactive Patient Education  2017 [employer].

## 2021-05-29 ENCOUNTER — Inpatient Hospital Stay (HOSPITAL_COMMUNITY)
Admission: EM | Admit: 2021-05-29 | Discharge: 2021-06-02 | DRG: 880 | Disposition: A | Discharge status: Home Health | Payer: Medicare Other | Attending: Internal Medicine | Admitting: Internal Medicine

## 2021-05-29 ENCOUNTER — Emergency Department (HOSPITAL_COMMUNITY): Payer: Medicare Other

## 2021-05-29 ENCOUNTER — Other Ambulatory Visit: Payer: Self-pay

## 2021-05-29 ENCOUNTER — Encounter (HOSPITAL_COMMUNITY): Payer: Self-pay

## 2021-05-29 DIAGNOSIS — R4182 Altered mental status, unspecified: Secondary | ICD-10-CM

## 2021-05-29 DIAGNOSIS — R1084 Generalized abdominal pain: Secondary | ICD-10-CM | POA: Diagnosis present

## 2021-05-29 DIAGNOSIS — N390 Urinary tract infection, site not specified: Secondary | ICD-10-CM | POA: Diagnosis present

## 2021-05-29 DIAGNOSIS — D72825 Bandemia: Secondary | ICD-10-CM | POA: Diagnosis not present

## 2021-05-29 DIAGNOSIS — K5150 Left sided colitis without complications: Secondary | ICD-10-CM | POA: Diagnosis present

## 2021-05-29 DIAGNOSIS — E43 Unspecified severe protein-calorie malnutrition: Secondary | ICD-10-CM | POA: Diagnosis not present

## 2021-05-29 DIAGNOSIS — R410 Disorientation, unspecified: Secondary | ICD-10-CM | POA: Diagnosis present

## 2021-05-29 DIAGNOSIS — E0590 Thyrotoxicosis, unspecified without thyrotoxic crisis or storm: Secondary | ICD-10-CM | POA: Diagnosis present

## 2021-05-29 DIAGNOSIS — E876 Hypokalemia: Secondary | ICD-10-CM | POA: Diagnosis present

## 2021-05-29 DIAGNOSIS — K529 Noninfective gastroenteritis and colitis, unspecified: Secondary | ICD-10-CM | POA: Diagnosis present

## 2021-05-29 DIAGNOSIS — F05 Delirium due to known physiological condition: Principal | ICD-10-CM | POA: Diagnosis present

## 2021-05-29 DIAGNOSIS — G309 Alzheimer's disease, unspecified: Secondary | ICD-10-CM | POA: Diagnosis not present

## 2021-05-29 DIAGNOSIS — R000 Tachycardia, unspecified: Secondary | ICD-10-CM | POA: Diagnosis not present

## 2021-05-29 DIAGNOSIS — E871 Hypo-osmolality and hyponatremia: Secondary | ICD-10-CM | POA: Diagnosis present

## 2021-05-29 DIAGNOSIS — E860 Dehydration: Secondary | ICD-10-CM | POA: Diagnosis present

## 2021-05-29 DIAGNOSIS — Z681 Body mass index (BMI) 19 or less, adult: Secondary | ICD-10-CM | POA: Diagnosis not present

## 2021-05-29 DIAGNOSIS — K5130 Ulcerative (chronic) rectosigmoiditis without complications: Secondary | ICD-10-CM | POA: Diagnosis not present

## 2021-05-29 DIAGNOSIS — F411 Generalized anxiety disorder: Secondary | ICD-10-CM | POA: Diagnosis present

## 2021-05-29 DIAGNOSIS — Z79899 Other long term (current) drug therapy: Secondary | ICD-10-CM | POA: Diagnosis not present

## 2021-05-29 DIAGNOSIS — N281 Cyst of kidney, acquired: Secondary | ICD-10-CM | POA: Diagnosis not present

## 2021-05-29 DIAGNOSIS — G9341 Metabolic encephalopathy: Secondary | ICD-10-CM | POA: Diagnosis present

## 2021-05-29 DIAGNOSIS — E8809 Other disorders of plasma-protein metabolism, not elsewhere classified: Secondary | ICD-10-CM | POA: Diagnosis not present

## 2021-05-29 DIAGNOSIS — K51919 Ulcerative colitis, unspecified with unspecified complications: Secondary | ICD-10-CM | POA: Diagnosis not present

## 2021-05-29 DIAGNOSIS — K5190 Ulcerative colitis, unspecified, without complications: Secondary | ICD-10-CM

## 2021-05-29 DIAGNOSIS — D72829 Elevated white blood cell count, unspecified: Secondary | ICD-10-CM

## 2021-05-29 DIAGNOSIS — T380X5A Adverse effect of glucocorticoids and synthetic analogues, initial encounter: Secondary | ICD-10-CM | POA: Diagnosis present

## 2021-05-29 DIAGNOSIS — R54 Age-related physical debility: Secondary | ICD-10-CM | POA: Diagnosis present

## 2021-05-29 DIAGNOSIS — E878 Other disorders of electrolyte and fluid balance, not elsewhere classified: Secondary | ICD-10-CM

## 2021-05-29 DIAGNOSIS — F0284 Dementia in other diseases classified elsewhere, unspecified severity, with anxiety: Secondary | ICD-10-CM | POA: Diagnosis present

## 2021-05-29 DIAGNOSIS — K5100 Ulcerative (chronic) pancolitis without complications: Secondary | ICD-10-CM | POA: Diagnosis present

## 2021-05-29 DIAGNOSIS — F0280 Dementia in other diseases classified elsewhere without behavioral disturbance: Secondary | ICD-10-CM | POA: Diagnosis present

## 2021-05-29 LAB — URINALYSIS, ROUTINE W REFLEX MICROSCOPIC
Bacteria, UA: NONE SEEN
Bilirubin Urine: NEGATIVE
Glucose, UA: NEGATIVE mg/dL
Hgb urine dipstick: NEGATIVE
Ketones, ur: NEGATIVE mg/dL
Nitrite: NEGATIVE
Protein, ur: NEGATIVE mg/dL
Specific Gravity, Urine: 1.004 — ABNORMAL LOW (ref 1.005–1.030)
pH: 7 (ref 5.0–8.0)

## 2021-05-29 LAB — COMPREHENSIVE METABOLIC PANEL WITH GFR
ALT: 11 U/L (ref 0–44)
AST: 9 U/L — ABNORMAL LOW (ref 15–41)
Albumin: 2.3 g/dL — ABNORMAL LOW (ref 3.5–5.0)
Alkaline Phosphatase: 51 U/L (ref 38–126)
Anion gap: 3 — ABNORMAL LOW (ref 5–15)
BUN: 10 mg/dL (ref 8–23)
CO2: 23 mmol/L (ref 22–32)
Calcium: 7.7 mg/dL — ABNORMAL LOW (ref 8.9–10.3)
Chloride: 113 mmol/L — ABNORMAL HIGH (ref 98–111)
Creatinine, Ser: 0.61 mg/dL (ref 0.44–1.00)
GFR, Estimated: >60 mL/min
Glucose, Bld: 75 mg/dL (ref 70–99)
Potassium: 2.1 mmol/L — CL (ref 3.5–5.1)
Sodium: 139 mmol/L (ref 135–145)
Total Bilirubin: 0.6 mg/dL (ref 0.3–1.2)
Total Protein: 5 g/dL — ABNORMAL LOW (ref 6.5–8.1)

## 2021-05-29 LAB — LACTIC ACID, PLASMA
Lactic Acid, Venous: 1.1 mmol/L (ref 0.5–1.9)
Lactic Acid, Venous: 0.8 mmol/L (ref 0.5–1.9)

## 2021-05-29 LAB — CBC WITH DIFFERENTIAL/PLATELET
Abs Immature Granulocytes: 0.41 10*3/uL — ABNORMAL HIGH (ref 0.00–0.07)
Basophils Absolute: 0 10*3/uL (ref 0.0–0.1)
Basophils Relative: 0 %
Eosinophils Absolute: 0 10*3/uL (ref 0.0–0.5)
Eosinophils Relative: 0 %
HCT: 38.4 % (ref 36.0–46.0)
Hemoglobin: 12.6 g/dL (ref 12.0–15.0)
Immature Granulocytes: 2 %
Lymphocytes Relative: 3 %
Lymphs Abs: 0.8 10*3/uL (ref 0.7–4.0)
MCH: 27.9 pg (ref 26.0–34.0)
MCHC: 32.8 g/dL (ref 30.0–36.0)
MCV: 85 fL (ref 80.0–100.0)
Monocytes Absolute: 1.5 10*3/uL — ABNORMAL HIGH (ref 0.1–1.0)
Monocytes Relative: 7 %
Neutro Abs: 20.2 10*3/uL — ABNORMAL HIGH (ref 1.7–7.7)
Neutrophils Relative %: 88 %
Platelets: 305 10*3/uL (ref 150–400)
RBC: 4.52 MIL/uL (ref 3.87–5.11)
RDW: 15.4 % (ref 11.5–15.5)
WBC: 22.9 10*3/uL — ABNORMAL HIGH (ref 4.0–10.5)
nRBC: 0 % (ref 0.0–0.2)

## 2021-05-29 LAB — RENAL FUNCTION PANEL
Albumin: 2.5 g/dL — ABNORMAL LOW (ref 3.5–5.0)
Anion gap: 5 (ref 5–15)
BUN: 8 mg/dL (ref 8–23)
CO2: 28 mmol/L (ref 22–32)
Calcium: 9.6 mg/dL (ref 8.9–10.3)
Chloride: 106 mmol/L (ref 98–111)
Creatinine, Ser: 0.55 mg/dL (ref 0.44–1.00)
GFR, Estimated: >60 mL/min
Glucose, Bld: 103 mg/dL — ABNORMAL HIGH (ref 70–99)
Phosphorus: 1.3 mg/dL — ABNORMAL LOW (ref 2.5–4.6)
Potassium: 3.5 mmol/L (ref 3.5–5.1)
Sodium: 139 mmol/L (ref 135–145)

## 2021-05-29 LAB — CBG MONITORING, ED: Glucose-Capillary: 92 mg/dL (ref 70–99)

## 2021-05-29 LAB — AMMONIA: Ammonia: 22 umol/L (ref 9–35)

## 2021-05-29 LAB — MAGNESIUM: Magnesium: 1.5 mg/dL — ABNORMAL LOW (ref 1.7–2.4)

## 2021-05-29 MED ORDER — SODIUM CHLORIDE 0.9 % IV SOLN
1.0000 g | Freq: Once | INTRAVENOUS | Status: AC
  Administered 2021-05-29: 1 g via INTRAVENOUS
  Filled 2021-05-29: qty 10

## 2021-05-29 MED ORDER — POTASSIUM CHLORIDE 10 MEQ/100ML IV SOLN
10.0000 meq | INTRAVENOUS | Status: AC
  Administered 2021-05-29 (×3): 10 meq via INTRAVENOUS
  Filled 2021-05-29 (×3): qty 100

## 2021-05-29 MED ORDER — POTASSIUM CHLORIDE CRYS ER 20 MEQ PO TBCR
20.0000 meq | EXTENDED_RELEASE_TABLET | Freq: Once | ORAL | Status: AC
  Administered 2021-05-29: 20 meq via ORAL
  Filled 2021-05-29: qty 1

## 2021-05-29 MED ORDER — MAGNESIUM SULFATE 2 GM/50ML IV SOLN
2.0000 g | Freq: Once | INTRAVENOUS | Status: AC
  Administered 2021-05-29: 2 g via INTRAVENOUS
  Filled 2021-05-29: qty 50

## 2021-05-29 MED ORDER — LORAZEPAM 0.5 MG PO TABS
0.5000 mg | ORAL_TABLET | Freq: Three times a day (TID) | ORAL | Status: DC | PRN
  Administered 2021-05-30: 0.5 mg via ORAL
  Filled 2021-05-29: qty 1

## 2021-05-29 MED ORDER — LACTATED RINGERS IV BOLUS
1000.0000 mL | Freq: Once | INTRAVENOUS | Status: AC
  Administered 2021-05-29: 1000 mL via INTRAVENOUS

## 2021-05-29 MED ORDER — POTASSIUM CHLORIDE 10 MEQ/100ML IV SOLN
10.0000 meq | INTRAVENOUS | Status: AC
  Administered 2021-05-29 (×2): 10 meq via INTRAVENOUS
  Filled 2021-05-29 (×2): qty 100
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
## 2021-05-29 NOTE — ED Notes (Signed)
Purewick placed on pt. 

## 2021-05-29 NOTE — ED Notes (Signed)
Bed alarm activated. Bed in lowest position

## 2021-05-29 NOTE — ED Notes (Signed)
PA made aware of K+

## 2021-05-29 NOTE — Plan of Care (Signed)
  Problem: Education: Goal: Knowledge of General Education information will improve Description: Including pain rating scale, medication(s)/side effects and non-pharmacologic comfort measures Outcome: Progressing   Problem: Health Behavior/Discharge Planning: Goal: Ability to manage health-related needs will improve Outcome: Progressing   Problem: Clinical Measurements: Goal: Ability to maintain clinical measurements within normal limits will improve Outcome: Progressing Goal: Will remain free from infection Outcome: Progressing Goal: Diagnostic test results will improve Outcome: Progressing   Problem: Activity: Goal: Risk for activity intolerance will decrease Outcome: Progressing   Problem: Nutrition: Goal: Adequate nutrition will be maintained Outcome: Progressing   Problem: Elimination: Goal: Will not experience complications related to bowel motility Outcome: Progressing Goal: Will not experience complications related to urinary retention Outcome: Progressing   Problem: Pain Managment: Goal: General experience of comfort will improve Outcome: Progressing   Problem: Skin Integrity: Goal: Risk for impaired skin integrity will decrease Outcome: Progressing
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
## 2021-05-30 ENCOUNTER — Inpatient Hospital Stay (HOSPITAL_COMMUNITY): Payer: Medicare Other

## 2021-05-30 DIAGNOSIS — D72825 Bandemia: Secondary | ICD-10-CM | POA: Diagnosis not present

## 2021-05-30 DIAGNOSIS — E860 Dehydration: Secondary | ICD-10-CM | POA: Diagnosis not present

## 2021-05-30 DIAGNOSIS — E878 Other disorders of electrolyte and fluid balance, not elsewhere classified: Secondary | ICD-10-CM | POA: Diagnosis not present

## 2021-05-30 DIAGNOSIS — E43 Unspecified severe protein-calorie malnutrition: Secondary | ICD-10-CM

## 2021-05-30 DIAGNOSIS — F411 Generalized anxiety disorder: Secondary | ICD-10-CM

## 2021-05-30 DIAGNOSIS — E876 Hypokalemia: Secondary | ICD-10-CM

## 2021-05-30 DIAGNOSIS — K5130 Ulcerative (chronic) rectosigmoiditis without complications: Secondary | ICD-10-CM

## 2021-05-30 DIAGNOSIS — R410 Disorientation, unspecified: Secondary | ICD-10-CM | POA: Diagnosis not present

## 2021-05-30 DIAGNOSIS — F0280 Dementia in other diseases classified elsewhere without behavioral disturbance: Secondary | ICD-10-CM

## 2021-05-30 DIAGNOSIS — G309 Alzheimer's disease, unspecified: Secondary | ICD-10-CM

## 2021-05-30 DIAGNOSIS — K5190 Ulcerative colitis, unspecified, without complications: Secondary | ICD-10-CM

## 2021-05-30 LAB — COMPREHENSIVE METABOLIC PANEL
ALT: 12 U/L (ref 0–44)
AST: 10 U/L — ABNORMAL LOW (ref 15–41)
Albumin: 2.8 g/dL — ABNORMAL LOW (ref 3.5–5.0)
Alkaline Phosphatase: 64 U/L (ref 38–126)
Anion gap: 6 (ref 5–15)
BUN: 7 mg/dL — ABNORMAL LOW (ref 8–23)
CO2: 24 mmol/L (ref 22–32)
Calcium: 9.3 mg/dL (ref 8.9–10.3)
Chloride: 103 mmol/L (ref 98–111)
Creatinine, Ser: 0.49 mg/dL (ref 0.44–1.00)
GFR, Estimated: >60 mL/min (ref >60)
Glucose, Bld: 90 mg/dL (ref 70–99)
Potassium: 4 mmol/L (ref 3.5–5.1)
Sodium: 133 mmol/L — ABNORMAL LOW (ref 135–145)
Total Bilirubin: 0.8 mg/dL (ref 0.3–1.2)
Total Protein: 6.1 g/dL — ABNORMAL LOW (ref 6.5–8.1)

## 2021-05-30 LAB — PHOSPHORUS: Phosphorus: 1.5 mg/dL — ABNORMAL LOW (ref 2.5–4.6)

## 2021-05-30 LAB — CBC
HCT: 39.8 % (ref 36.0–46.0)
Hemoglobin: 12.9 g/dL (ref 12.0–15.0)
MCH: 27.9 pg (ref 26.0–34.0)
MCHC: 32.4 g/dL (ref 30.0–36.0)
MCV: 86 fL (ref 80.0–100.0)
Platelets: 315 10*3/uL (ref 150–400)
RBC: 4.63 MIL/uL (ref 3.87–5.11)
RDW: 16 % — ABNORMAL HIGH (ref 11.5–15.5)
WBC: 11.8 10*3/uL — ABNORMAL HIGH (ref 4.0–10.5)
nRBC: 0 % (ref 0.0–0.2)

## 2021-05-30 LAB — MAGNESIUM: Magnesium: 2.6 mg/dL — ABNORMAL HIGH (ref 1.7–2.4)

## 2021-05-30 MED ORDER — PREDNISONE 10 MG PO TABS: 10.0000 mg | ORAL_TABLET | ORAL | Status: DC

## 2021-05-30 MED ORDER — PREDNISONE 10 MG PO TABS: 10.0000 mg | ORAL_TABLET | Freq: Every day | ORAL | Status: DC

## 2021-05-30 MED ORDER — ACETAMINOPHEN 650 MG RE SUPP: 650.0000 mg | Freq: Four times a day (QID) | RECTAL | Status: DC | PRN

## 2021-05-30 MED ORDER — MAGNESIUM SULFATE 2 GM/50ML IV SOLN
2.0000 g | Freq: Once | INTRAVENOUS | Status: DC
Start: 2021-05-30 — End: 2021-05-30

## 2021-05-30 MED ORDER — SODIUM CHLORIDE 0.9 % IV SOLN
2.0000 g | INTRAVENOUS | Status: DC
  Administered 2021-05-30 – 2021-06-01 (×3): 2 g via INTRAVENOUS
  Filled 2021-05-30 (×3): qty 20

## 2021-05-30 MED ORDER — IOHEXOL 9 MG/ML PO SOLN
500.0000 mL | ORAL | Status: AC
  Administered 2021-05-30 (×2): 500 mL via ORAL

## 2021-05-30 MED ORDER — HYDROCODONE-ACETAMINOPHEN 5-325 MG PO TABS: 1.0000 | ORAL_TABLET | ORAL | Status: DC | PRN

## 2021-05-30 MED ORDER — SODIUM PHOSPHATES 45 MMOLE/15ML IV SOLN
30.0000 mmol | Freq: Once | INTRAVENOUS | Status: AC
  Administered 2021-05-30: 30 mmol via INTRAVENOUS
  Filled 2021-05-30: qty 10

## 2021-05-30 MED ORDER — IOHEXOL 300 MG/ML  SOLN
75.0000 mL | Freq: Once | INTRAMUSCULAR | Status: AC | PRN
  Administered 2021-05-30: 75 mL via INTRAVENOUS

## 2021-05-30 MED ORDER — ACETAMINOPHEN 325 MG PO TABS: 650.0000 mg | ORAL_TABLET | Freq: Four times a day (QID) | ORAL | Status: DC | PRN

## 2021-05-30 MED ORDER — PREDNISONE 20 MG PO TABS
20.0000 mg | ORAL_TABLET | Freq: Every day | ORAL | Status: DC
  Administered 2021-05-30 – 2021-06-01 (×3): 20 mg via ORAL
  Filled 2021-05-30 (×3): qty 1

## 2021-05-30 MED ORDER — QUETIAPINE FUMARATE 50 MG PO TABS
50.0000 mg | ORAL_TABLET | Freq: Every day | ORAL | Status: DC
  Administered 2021-05-30: 50 mg via ORAL
  Filled 2021-05-30: qty 1

## 2021-05-30 MED ORDER — MEMANTINE HCL 10 MG PO TABS
5.0000 mg | ORAL_TABLET | Freq: Every day | ORAL | Status: DC
  Administered 2021-05-30 – 2021-06-01 (×4): 5 mg via ORAL
  Filled 2021-05-30 (×4): qty 1

## 2021-05-30 MED ORDER — HALOPERIDOL LACTATE 5 MG/ML IJ SOLN
2.0000 mg | Freq: Four times a day (QID) | INTRAMUSCULAR | Status: DC | PRN
  Administered 2021-05-30: 2 mg via INTRAVENOUS
  Filled 2021-05-30: qty 1

## 2021-05-30 MED ORDER — QUETIAPINE FUMARATE 25 MG PO TABS
25.0000 mg | ORAL_TABLET | Freq: Every day | ORAL | Status: DC
  Administered 2021-05-30 – 2021-05-31 (×2): 25 mg via ORAL
  Filled 2021-05-30 (×2): qty 1

## 2021-05-30 MED ORDER — LORAZEPAM 2 MG/ML IJ SOLN
0.5000 mg | INTRAMUSCULAR | Status: DC | PRN
  Administered 2021-05-30: 0.5 mg via INTRAVENOUS
  Filled 2021-05-30: qty 1

## 2021-05-30 MED ORDER — METRONIDAZOLE 500 MG/100ML IV SOLN
500.0000 mg | Freq: Two times a day (BID) | INTRAVENOUS | Status: DC
  Administered 2021-05-30: 500 mg via INTRAVENOUS
  Filled 2021-05-30: qty 100

## 2021-05-30 MED ORDER — MESALAMINE ER 250 MG PO CPCR
1500.0000 mg | ORAL_CAPSULE | Freq: Every day | ORAL | Status: DC
  Administered 2021-05-30 – 2021-06-02 (×4): 1500 mg via ORAL
  Filled 2021-05-30 (×4): qty 6

## 2021-05-30 MED ORDER — ENOXAPARIN SODIUM 30 MG/0.3ML IJ SOSY
30.0000 mg | PREFILLED_SYRINGE | INTRAMUSCULAR | Status: DC
  Administered 2021-05-30 – 2021-06-02 (×4): 30 mg via SUBCUTANEOUS
  Filled 2021-05-30 (×4): qty 0.3

## 2021-05-30 MED ORDER — LACTATED RINGERS IV SOLN: INTRAVENOUS | Status: DC

## 2021-05-30 NOTE — Assessment & Plan Note (Addendum)
Body mass index is 17.18 kg/m. Nutrition Status: Nutrition Problem: Inadequate oral intake Etiology: chronic illness, lethargy/confusion (hx Alzheimer's dementia) Signs/Symptoms: per patient/family report Interventions: Ensure Enlive (each supplement provides 350kcal and 20 grams of protein), MVI

## 2021-05-30 NOTE — Hospital Course (Addendum)
76 year old F with PMH of Alzheimer's dementia, ulcerative colitis and anxiety recently started on prednisone for ulcerative colitis flare brought to ED by EMS due to confusion, decreased p.o. intake, delusion and paranoia, and admitted for delirium, dehydration and electrolyte abnormalities including hypokalemia, hypomagnesemia and hypophosphatemia.  She also had leukocytosis to 23 with left shift.  UA with moderate LE but no bacteria or nitrite.  Two-view chest x-ray negative.  C. difficile, GIP, blood cultures and CT abdomen and pelvis ordered.  Patient was started on IV fluid, CTX and Flagyl and admitted.   The next day, CT abdomen and pelvis concerning for active distal colitis correlating with history of ulcerative colitis.  Leukocytosis, dehydration, lytes and mental status improved.  Completed 4 days of CTX for possible UTI empirically.  GI consulted for colitis.  On low-dose Seroquel for delirium.   She is much improved and PT/OT recommending Home Health. She is medically stable to D/C Home and follow up with PCP and Gastroenterology and will continue po Prednisone 10 mg Daily x7.

## 2021-05-30 NOTE — Assessment & Plan Note (Addendum)
Reassurance and family support
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
## 2021-05-30 NOTE — Assessment & Plan Note (Addendum)
-  Hypokalemia and hypomagnesemia resolved. Phos is resolved as well -IV sodium phosphate 30 mmol x 1 yesterday

## 2021-05-30 NOTE — Assessment & Plan Note (Signed)
See delirium. -Continue home memantine

## 2021-05-30 NOTE — Progress Notes (Signed)
Remains confused and delirious.  Trying to remove name tag, Ace wrap over IV line and attempting to get out of the bed.  Sitter and daughter at bedside.  Starting Seroquel 25 mg daily with 50 mg at night.  IV Haldol 2 mg every 6 hours as needed agitation as a backup.  No prolonged QTc on EKG.  Potassium and magnesium within normal.  Discussed risk and benefit with patient's daughter, Anderson [place] at bedside.  Briefly discussed about CODE STATUS as well.  Remains full code but daughter to discuss with the rest of the family.

## 2021-05-30 NOTE — Assessment & Plan Note (Addendum)
Patient seems to have advanced Alzheimer's dementia.  Delirium likely provoked by steroid for ulcerative colitis and dehydration.  Low suspicion for infectious process.  Leukocytosis likely demargination from steroid and hemoconcentration from dehydration.  UA with pyuria but no bacteria.  Blood cultures NGTD.  She has no focal neurodeficit to suggest CVA.  B12 within normal.  TSH low.  Free T4 slightly elevated.  Delirium seems to have improved with Seroquel. -Delirium precaution and safety sitter -Complete 3 days of IV ceftriaxone for possible UTI. -Decrease Seroquel to 12.5 mg in the morning and 25 mg at night.  QTc 420. -IV Haldol 2 mg every 6 hours as needed -Optimize electrolytes -Recheck TFT.

## 2021-05-30 NOTE — Progress Notes (Signed)
Anissa, NT arrived to be Air cabin crew for patient. Discussed goals for patient.

## 2021-05-30 NOTE — Assessment & Plan Note (Addendum)
Most likely from steroid and hemoconcentration versus true infection.  Improved. -Recheck in the morning

## 2021-05-30 NOTE — Assessment & Plan Note (Addendum)
She was started on prednisone 40 mg daily 2 weeks prior.  Currently on 20 mg daily.  CT abdomen and pelvis showed wall thickening involving the descending, sigmoid, and rectal colon in keeping with ulcerative colitis.  CRP 4.4. -Continue prednisone at current dose -Continue home mesalamine -Follow C. difficile, GIP and ESR -GI consult

## 2021-05-30 NOTE — Assessment & Plan Note (Addendum)
In the setting of delirium.  Improved. -Continue IV LR at 100 cc an hour -Encourage oral intake

## 2021-05-31 DIAGNOSIS — R410 Disorientation, unspecified: Secondary | ICD-10-CM | POA: Diagnosis not present

## 2021-05-31 DIAGNOSIS — E860 Dehydration: Secondary | ICD-10-CM | POA: Diagnosis not present

## 2021-05-31 DIAGNOSIS — R1084 Generalized abdominal pain: Secondary | ICD-10-CM

## 2021-05-31 DIAGNOSIS — E878 Other disorders of electrolyte and fluid balance, not elsewhere classified: Secondary | ICD-10-CM | POA: Diagnosis not present

## 2021-05-31 DIAGNOSIS — D72825 Bandemia: Secondary | ICD-10-CM | POA: Diagnosis not present

## 2021-05-31 DIAGNOSIS — E0590 Thyrotoxicosis, unspecified without thyrotoxic crisis or storm: Secondary | ICD-10-CM

## 2021-05-31 LAB — C DIFFICILE QUICK SCREEN W PCR REFLEX
C Diff antigen: NEGATIVE
C Diff interpretation: NOT DETECTED
C Diff toxin: NEGATIVE

## 2021-05-31 LAB — RENAL FUNCTION PANEL
Albumin: 2.1 g/dL — ABNORMAL LOW (ref 3.5–5.0)
Anion gap: 7 (ref 5–15)
BUN: 6 mg/dL — ABNORMAL LOW (ref 8–23)
CO2: 22 mmol/L (ref 22–32)
Calcium: 8.7 mg/dL — ABNORMAL LOW (ref 8.9–10.3)
Chloride: 109 mmol/L (ref 98–111)
Creatinine, Ser: 0.39 mg/dL — ABNORMAL LOW (ref 0.44–1.00)
GFR, Estimated: >60 mL/min (ref >60)
Glucose, Bld: 114 mg/dL — ABNORMAL HIGH (ref 70–99)
Phosphorus: 2.3 mg/dL — ABNORMAL LOW (ref 2.5–4.6)
Potassium: 3.2 mmol/L — ABNORMAL LOW (ref 3.5–5.1)
Sodium: 138 mmol/L (ref 135–145)

## 2021-05-31 LAB — C-REACTIVE PROTEIN: CRP: 4.4 mg/dL — ABNORMAL HIGH (ref <1.0)

## 2021-05-31 LAB — TSH: TSH: 0.306 u[IU]/mL — ABNORMAL LOW (ref 0.350–4.500)

## 2021-05-31 LAB — CBC
HCT: 36.8 % (ref 36.0–46.0)
Hemoglobin: 11.7 g/dL — ABNORMAL LOW (ref 12.0–15.0)
MCH: 27.5 pg (ref 26.0–34.0)
MCHC: 31.8 g/dL (ref 30.0–36.0)
MCV: 86.6 fL (ref 80.0–100.0)
Platelets: 182 10*3/uL (ref 150–400)
RBC: 4.25 MIL/uL (ref 3.87–5.11)
RDW: 16.3 % — ABNORMAL HIGH (ref 11.5–15.5)
WBC: 12 10*3/uL — ABNORMAL HIGH (ref 4.0–10.5)
nRBC: 0 % (ref 0.0–0.2)

## 2021-05-31 LAB — T4, FREE: Free T4: 1.56 ng/dL — ABNORMAL HIGH (ref 0.61–1.12)

## 2021-05-31 LAB — AMMONIA: Ammonia: 19 umol/L (ref 9–35)

## 2021-05-31 LAB — MAGNESIUM: Magnesium: 1.7 mg/dL (ref 1.7–2.4)

## 2021-05-31 MED ORDER — QUETIAPINE FUMARATE 25 MG PO TABS
25.0000 mg | ORAL_TABLET | Freq: Every day | ORAL | Status: DC
  Administered 2021-05-31 – 2021-06-01 (×2): 25 mg via ORAL
  Filled 2021-05-31 (×2): qty 1

## 2021-05-31 MED ORDER — QUETIAPINE FUMARATE 25 MG PO TABS
12.5000 mg | ORAL_TABLET | Freq: Every day | ORAL | Status: DC
  Administered 2021-06-01 – 2021-06-02 (×2): 12.5 mg via ORAL
  Filled 2021-05-31 (×2): qty 1

## 2021-05-31 MED ORDER — POTASSIUM CHLORIDE 20 MEQ PO PACK
40.0000 meq | PACK | ORAL | Status: AC
  Administered 2021-05-31 (×2): 40 meq via ORAL
  Filled 2021-05-31 (×2): qty 2

## 2021-05-31 MED ORDER — ENSURE ENLIVE PO LIQD
237.0000 mL | Freq: Two times a day (BID) | ORAL | Status: DC
  Administered 2021-05-31 – 2021-06-02 (×4): 237 mL via ORAL

## 2021-05-31 MED ORDER — ADULT MULTIVITAMIN W/MINERALS CH
1.0000 | ORAL_TABLET | Freq: Every day | ORAL | Status: DC
  Administered 2021-05-31 – 2021-06-02 (×3): 1 via ORAL
  Filled 2021-05-31 (×3): qty 1

## 2021-05-31 MED ORDER — MAGNESIUM SULFATE 2 GM/50ML IV SOLN
2.0000 g | Freq: Once | INTRAVENOUS | Status: AC
  Administered 2021-05-31: 2 g via INTRAVENOUS
  Filled 2021-05-31: qty 50

## 2021-05-31 NOTE — TOC Initial Note (Signed)
Transition of Care [facility]) - Initial/Assessment Note    Patient Details  Name: Cynthia Perry MRN: [id number] Date of Birth: Mar 27, 1945  Transition of Care [facility]) CM/SW Contact:    Leeroy Cha, RN Phone Number: 05/31/2021, 8:07 AM  Clinical Narrative:                  Transition of Care [facility]) Screening Note   Patient Details  Name: Cynthia Perry Date of Birth: 1945/03/05   Transition of Care [facility]) CM/SW Contact:    Leeroy Cha, RN Phone Number: 05/31/2021, 8:07 AM    Transition of Care Department (TOC) has reviewed patient and no TOC needs have been identified at this time. We will continue to monitor patient advancement through interdisciplinary progression rounds. If new patient transition needs arise, please place a TOC consult.    Expected Discharge Plan: Home/Self Care Barriers to Discharge: Continued Medical Work up   Patient Goals and CMS Choice Patient states their goals for this hospitalization and ongoing recovery are:: unable to state   Choice offered to / list presented to : Adult Children  Expected Discharge Plan and Services Expected Discharge Plan: Home/Self Care   Discharge Planning Services: CM Consult   Living arrangements for the past 2 months: Single Family Home                                      Prior Living Arrangements/Services Living arrangements for the past 2 months: Single Family Home Lives with:: Spouse Patient language and need for interpreter reviewed:: Yes Do you feel safe going back to the place where you live?:  (unable to state)      Need for Family Participation in Patient Care: Yes (Comment) (daughter) Care giver support system in place?: Yes (comment) (daughter and spouse)   Criminal Activity/Legal Involvement Pertinent to Current Situation/Hospitalization: No - Comment as needed  Activities of Daily Living Home Assistive Devices/Equipment: None ADL Screening (condition at time of  admission) Patient's cognitive ability adequate to safely complete daily activities?: No Is the patient deaf or have difficulty hearing?: No Does the patient have difficulty seeing, even when wearing glasses/contacts?: No Does the patient have difficulty concentrating, remembering, or making decisions?: Yes Patient able to express need for assistance with ADLs?: Yes Does the patient have difficulty dressing or bathing?: Yes Independently performs ADLs?: No Communication: Needs assistance Dressing (OT): Needs assistance Is this a change from baseline?: Pre-admission baseline Grooming: Needs assistance Is this a change from baseline?: Pre-admission baseline Feeding: Independent Bathing: Needs assistance Is this a change from baseline?: Pre-admission baseline Toileting: Needs assistance Is this a change from baseline?: Pre-admission baseline In/Out Bed: Independent Walks in Home: Needs assistance Is this a change from baseline?: Pre-admission baseline Does the patient have difficulty walking or climbing stairs?: Yes Weakness of Legs: Both Weakness of Arms/Hands: None  Permission Sought/Granted                  Emotional Assessment Appearance:: Appears stated age     Orientation: : Fluctuating Orientation (Suspected and/or reported Sundowners) Alcohol / Substance Use: Not Applicable Psych Involvement: No (comment)  Admission diagnosis:  Hypokalemia [E87.6] Altered mental status, unspecified altered mental status type [R41.82] Leukocytosis, unspecified type [D72.829] AMS (altered mental status) [R41.82] Patient Active Problem List   Diagnosis Date Noted   Hypokalemia, hypomagnesemia, hypophosphatemia, hyponatremia 05/30/2021   Hypophosphatemia 05/30/2021   Ulcerative colitis  without complications ([place]) 78/24/2353   Bandemia 05/30/2021   Delirium 05/29/2021   Hypokalemia 05/29/2021   Hypomagnesemia 05/29/2021   Dehydration 05/29/2021   Leukocytosis 05/29/2021    Protein-calorie malnutrition, severe ([place]) 05/29/2021   Weight loss 05/10/2021   Xerostomia 05/10/2021   Major neurocognitive disorder due to Alzheimer's disease 12/30/2020   Left upper quadrant abdominal mass    Generalized abdominal pain    Hypercalcemia    Left sided ulcerative colitis    Tachycardia    Hypotension due to drugs    Generalized anxiety disorder    History of ASCVD    Vitamin B12 deficiency    Debilitated    PCP:  Libby Maw, MD Pharmacy:   [facility] [phone], [address] AT [facility] OF [address] [place]  [address] Phone: [phone] Fax: [phone]     Social Determinants of Health (SDOH) Interventions    Readmission Risk Interventions     View : No data to display.

## 2021-05-31 NOTE — Progress Notes (Signed)
Initial Nutrition Assessment  DOCUMENTATION CODES:   Underweight  INTERVENTION:  - will order Ensure Plus High Protein BID, each supplement provides 350 kcal and 20 grams of protein. - will order 1 tablet multivitamin with minerals/day. - provided daughter with Ensure coupons for home use.  - complete NFPE when feasible.   NUTRITION DIAGNOSIS:   Inadequate oral intake related to chronic illness, lethargy/confusion (hx Alzheimer's dementia) as evidenced by per patient/family report.  GOAL:   Patient will meet greater than or equal to 90% of their needs  MONITOR:   PO intake, Supplement acceptance, Labs, Weight trends  REASON FOR ASSESSMENT:   Consult Assessment of nutrition requirement/status  ASSESSMENT:   76 year old female with medical history of Alzheimer's dementia, ulcerative colitis (UC), hypercholesterolemia, vitamin B12 deficiency, and anxiety. She was recently started on prednisone for UC flare. She was brought to ED by EMS due to confusion, decreased p.o. intake, delusion, and paranoia. She was admitted for delirium, dehydration, and electrolyte abnormalities including hypokalemia, hypomagnesemia, and hypophosphatemia. CXR was negative. C.diff, GI panel, blood cultures, and CT abdomen/pelvis ordered. CT was concerning for active distal colitis.  Patient sleeping soundly during RD visit. Flow sheet documentation indicates she is a/o to self only.  Sitter at bedside and shares that patient ate all of breakfast except for cream of wheat. Patient did not have any coughing or evidence of pocketing during the meal. Sitter aided patient during meal and provided encouragement for patient to self-feed as able.  Patient's daughter at bedside. She shares that patient lives with her (patient's) husband. Daughter has 2 siblings and the 3 of them live within a close distance of patient and one of them is present each evening. They share responsibility of preparing meals for patient  and her husband. Breakfast is typically something such as fruit and a muffin, lunch is a sandwich or similar, and dinner is a full meal.  Patient does not have coughing or evidence of pocketing at home. She does often forget that she is eating and needs encouragement and reminders to consume the things in front of her but once reminded does well and is able to self-feed.   She does not have Ensure or similar at home but was provided a chocolate flavored one yesterday which she enjoyed. Daughter shares that she and her siblings have discussed having protein shakes available in the home to use as needed.   Patient is able to ambulate. She has been shaky and tends to shuffle. She was previously using a cane but now requires a walker or some assistance from family.   Weight on 5/27 was 97 lb and weight on 01/21/21 was 123 lb. This indicates 26 lb weight loss (21% body weight) in the past 4 months.  Suspect some degree of malnutrition but will need to complete NFPE to confirm degree.    Labs reviewed; K: 3.2 mmol/l, Mg low end of WDL, Phos: 2.3 mg/dl, BUN: 6 mg/dl, creatinine: 0.39 mg/dl, Ca: 8.7 mg/dl.  Medications reviewed; 2 g IV Mg sulfate x1 run 5/29, 40 mEq Klor-Con x2 doses 5/29, deltasone taper, 30 mmol IV NaPhos x1 run 5/28.     NUTRITION - FOCUSED PHYSICAL EXAM:  Deferred while patient sleeping soundly.   Diet Order:   Diet Order             DIET SOFT Room service appropriate? Yes; Fluid consistency: Thin  Diet effective now  EDUCATION NEEDS:   Education needs have been addressed  Skin:  Skin Assessment: Reviewed RN Assessment  Last BM:  5/29 (type 7 x1, small amount; type 6 x1, large amount)  Height:   Ht Readings from Last 1 Encounters:  05/29/21 5' 3"  (1.6 m)    Weight:   Wt Readings from Last 1 Encounters:  05/29/21 44 kg     BMI:  Body mass index is 17.18 kg/m.  Estimated Nutritional Needs:  Kcal:  1550-1750 kcal Protein:  77-87  grams Fluid:  >/= 1.8 L/day     Jarome Matin, MS, RD, LDN Registered Dietitian II Inpatient Clinical Nutrition RD pager # and on-call/weekend pager # available in [facility]

## 2021-05-31 NOTE — Assessment & Plan Note (Signed)
TSH slightly low at 0.306.  TFT 1.56.  Difficult to interpret in acute setting.  Does not seem to have clinical signs -May need to repeat TFT in about 2 to 3 weeks  -Can also consider low-dose beta-blocker if tachycardic -Discussed with patient's daughter.

## 2021-05-31 NOTE — Progress Notes (Signed)
PROGRESS NOTE  Cynthia Perry NWG:[id number] DOB: September 04, 1945   PCP: Libby Maw, MD  Patient is from: Home  DOA: 05/29/2021 LOS: 2  Chief complaints Chief Complaint  Patient presents with   Altered Mental Status     Brief Narrative / Interim history: 76 year old F with PMH of Alzheimer's dementia, ulcerative colitis and anxiety recently started on prednisone for ulcerative colitis flare brought to ED by EMS due to confusion, decreased p.o. intake, delusion and paranoia, and admitted for delirium, dehydration and electrolyte abnormalities including hypokalemia, hypomagnesemia and hypophosphatemia.  She also had leukocytosis to 23 with left shift.  UA with moderate LE but no bacteria or nitrite.  Two-view chest x-ray negative.  C. difficile, GIP, blood cultures and CT abdomen and pelvis ordered.  Patient was started on IV fluid, CTX and Flagyl and admitted.   The next day, CT abdomen and pelvis concerning for active distal colitis correlating with history of ulcerative colitis but patient without diarrhea or GI symptoms although not a reliable historian.  Leukocytosis and electrolyte abnormalities improved.   Mains on IV ceftriaxone.  Delirium improved with Seroquel.     Subjective: Seen and examined earlier this morning.  No major events overnight of this morning.  She is calmer and cooperative today.  No complaints but not a great historian.  She is oriented to self and "hospital".  Objective: Vitals:   05/30/21 2049 05/30/21 2059 05/30/21 2109 05/31/21 0542  BP: 111/74 111/74 113/68 101/64  Pulse: (!) 108 (!) 105 (!) 103 75  Resp:  18 18 18   Temp: 97.9 F (36.6 C)   (!) 97.4 F (36.3 C)  TempSrc: Oral   Axillary  SpO2: 97% 94% 98% 100%  Weight:      Height:        Examination:  GENERAL: No apparent distress.  Nontoxic. HEENT: MMM.  Vision and hearing grossly intact.  NECK: Supple.  No apparent JVD.  RESP:  No IWOB.  Fair aeration bilaterally. CVS:  RRR.  Heart sounds normal.  ABD/GI/GU: BS+. Abd soft, NTND.  MSK/EXT:  Moves extremities. No apparent deformity. No edema.  SKIN: no apparent skin lesion or wound NEURO: Awake.  Oriented to self and "hospital".  No apparent focal neuro deficit. PSYCH: Calm. Normal affect.     Procedures:  None  Microbiology summarized: Blood cultures NGTD C. difficile and GIP ordered.  Assessment and Plan: * Delirium Patient seems to have advanced Alzheimer's dementia.  Delirium likely provoked by steroid for ulcerative colitis and dehydration.  Low suspicion for infectious process.  Leukocytosis likely demargination from steroid and hemoconcentration from dehydration.  UA with pyuria but no bacteria.  Blood cultures NGTD.  She has no focal neurodeficit to suggest CVA.  B12 within normal.  TSH low.  Free T4 slightly elevated.  Delirium seems to have improved with Seroquel. -Delirium precaution and safety sitter -Complete 3 days of IV ceftriaxone for possible UTI. -Decrease Seroquel to 12.5 mg in the morning and 25 mg at night.  QTc 420. -IV Haldol 2 mg every 6 hours as needed -Optimize electrolytes -Recheck TFT.  Ulcerative colitis without complications ([place]) She was started on prednisone 40 mg daily 2 weeks prior.  Currently on 20 mg daily.  CT abdomen and pelvis showed wall thickening involving the descending, sigmoid, and rectal colon in keeping with ulcerative colitis.  CRP 4.4. -Continue prednisone at current dose -Continue home mesalamine -Follow C. difficile, GIP and ESR -GI consult  Hypokalemia, hypomagnesemia, hypophosphatemia, hyponatremia K3.2.  Mg 1.7.  P 2.3. -P.o. KCl 40x2 -IV magnesium 2 g x 1 -Recheck in the morning  Dehydration In the setting of delirium.  Improved. -Continue IV LR at 100 cc an hour -Encourage oral intake  Protein-calorie malnutrition, severe ([facility]) Body mass index is 17.18 kg/m. Nutrition Status: Nutrition Problem: Inadequate oral intake Etiology: chronic  illness, lethargy/confusion (hx Alzheimer's dementia) Signs/Symptoms: per patient/family report Interventions: Ensure Enlive (each supplement provides 350kcal and 20 grams of protein), MVI  Major neurocognitive disorder due to Alzheimer's disease See delirium. -Continue home memantine  Generalized anxiety disorder Reassurance and family support  Hyperthyroidism TSH slightly low at 0.306.  TFT 1.56.  Difficult to interpret in acute setting.  Does not seem to have clinical signs -May need to repeat TFT in about 2 to 3 weeks  -Can also consider low-dose beta-blocker if tachycardic  Bandemia Most likely from steroid and hemoconcentration versus true infection.  Improved. -Recheck in the morning   DVT prophylaxis:  enoxaparin (LOVENOX) injection 30 mg Start: 05/30/21 1000  Code Status: Full code Family Communication: Updated patient's daughter Level of care: Med-Surg Status is: Inpatient Remains inpatient appropriate because: Delirium, dehydration and electrolyte abnormalities   Final disposition: TBD Consultants:  GI  Sch Meds:  Scheduled Meds:  enoxaparin (LOVENOX) injection  30 mg Subcutaneous Q24H   feeding supplement  237 mL Oral BID BM   memantine  5 mg Oral QHS   mesalamine  1,500 mg Oral Daily   multivitamin with minerals  1 tablet Oral Daily   potassium chloride  40 mEq Oral Q4H   predniSONE  20 mg Oral Q breakfast   Followed by   Derrill Memo ON 06/04/2021] predniSONE  10 mg Oral Q breakfast   [START ON 06/01/2021] QUEtiapine  12.5 mg Oral Daily   And   QUEtiapine  25 mg Oral QHS   Continuous Infusions:  cefTRIAXone (ROCEPHIN)  IV 2 g (05/31/21 1127)   PRN Meds:.acetaminophen **OR** acetaminophen, haloperidol lactate, HYDROcodone-acetaminophen  Antimicrobials: Anti-infectives (From admission, onward)    Start     Dose/Rate Route Frequency Ordered Stop   05/30/21 1000  cefTRIAXone (ROCEPHIN) 2 g in sodium chloride 0.9 % 100 mL IVPB        2 g 200 mL/hr over 30  Minutes Intravenous Every 24 hours 05/30/21 0012     05/30/21 0012  metroNIDAZOLE (FLAGYL) IVPB 500 mg  Status:  Discontinued        500 mg 100 mL/hr over 60 Minutes Intravenous Every 12 hours 05/30/21 0012 05/30/21 1154   05/29/21 1545  cefTRIAXone (ROCEPHIN) 1 g in sodium chloride 0.9 % 100 mL IVPB        1 g 200 mL/hr over 30 Minutes Intravenous  Once 05/29/21 1531 05/29/21 1625        I have personally reviewed the following labs and images: CBC: Recent Labs  Lab 05/29/21 1310 05/30/21 0345 05/31/21 0536  WBC 22.9* 11.8* 12.0*  NEUTROABS 20.2*  --   --   HGB 12.6 12.9 11.7*  HCT 38.4 39.8 36.8  MCV 85.0 86.0 86.6  PLT 305 315 182   BMP &GFR Recent Labs  Lab 05/29/21 1310 05/29/21 1950 05/30/21 0345 05/31/21 0338  NA 139 139 133* 138  K 2.1* 3.5 4.0 3.2*  CL 113* 106 103 109  CO2 23 28 24 22   GLUCOSE 75 103* 90 114*  BUN 10 8 7* 6*  CREATININE 0.61 0.55 0.49 0.39*  CALCIUM 7.7* 9.6 9.3 8.7*  MG 1.5*  --  2.6* 1.7  PHOS  --  1.3* 1.5* 2.3*   Estimated Creatinine Clearance: 40.9 mL/min (A) (by C-G formula based on SCr of 0.39 mg/dL (L)). Liver & Pancreas: Recent Labs  Lab 05/29/21 1310 05/29/21 1950 05/30/21 0345 05/31/21 0338  AST 9*  --  10*  --   ALT 11  --  12  --   ALKPHOS 51  --  64  --   BILITOT 0.6  --  0.8  --   PROT 5.0*  --  6.1*  --   ALBUMIN 2.3* 2.5* 2.8* 2.1*   No results for input(s): LIPASE, AMYLASE in the last 168 hours. Recent Labs  Lab 05/29/21 1436 05/31/21 0338  AMMONIA 22 19   Diabetic: No results for input(s): HGBA1C in the last 72 hours. Recent Labs  Lab 05/29/21 1323  GLUCAP 92   Cardiac Enzymes: No results for input(s): CKTOTAL, CKMB, CKMBINDEX, TROPONINI in the last 168 hours. No results for input(s): PROBNP in the last 8760 hours. Coagulation Profile: No results for input(s): INR, PROTIME in the last 168 hours. Thyroid Function Tests: Recent Labs    05/31/21 0338 05/31/21 0757  TSH 0.306*  --   FREET4   --  1.56*   Lipid Profile: No results for input(s): CHOL, HDL, LDLCALC, TRIG, CHOLHDL, LDLDIRECT in the last 72 hours. Anemia Panel: No results for input(s): VITAMINB12, FOLATE, FERRITIN, TIBC, IRON, RETICCTPCT in the last 72 hours. Urine analysis:    Component Value Date/Time   COLORURINE STRAW (A) 05/29/2021 1310   APPEARANCEUR CLEAR 05/29/2021 1310   LABSPEC 1.004 (L) 05/29/2021 1310   PHURINE 7.0 05/29/2021 1310   GLUCOSEU NEGATIVE 05/29/2021 1310   GLUCOSEU NEGATIVE 07/23/2020 1006   HGBUR NEGATIVE 05/29/2021 1310   BILIRUBINUR NEGATIVE 05/29/2021 1310   KETONESUR NEGATIVE 05/29/2021 1310   PROTEINUR NEGATIVE 05/29/2021 1310   UROBILINOGEN 0.2 07/23/2020 1006   NITRITE NEGATIVE 05/29/2021 1310   LEUKOCYTESUR MODERATE (A) 05/29/2021 1310   Sepsis Labs: Invalid input(s): PROCALCITONIN, [place]  Microbiology: Recent Results (from the past 240 hour(s))  Culture, blood (routine x 2)     Status: None (Preliminary result)   Collection Time: 05/29/21  2:14 PM   Specimen: BLOOD  Result Value Ref Range Status   Specimen Description   Final    BLOOD LEFT ANTECUBITAL Performed at [facility] [address], [place], [place] [address]    Special Requests   Final    BOTTLES DRAWN AEROBIC AND ANAEROBIC Blood Culture results may not be optimal due to an excessive volume of blood received in culture bottles Performed at [facility] [address], [place], [place] [address]    Culture   Final    NO GROWTH 2 DAYS Performed at [facility] [address] [place], [place] [address]    Report Status PENDING  Incomplete  Culture, blood (routine x 2)     Status: None (Preliminary result)   Collection Time: 05/29/21  2:19 PM   Specimen: BLOOD RIGHT FOREARM  Result Value Ref Range Status   Specimen Description   Final    BLOOD RIGHT FOREARM Performed at [facility], [place] [address] [place], [place] [address]    Special  Requests   Final    BOTTLES DRAWN AEROBIC AND ANAEROBIC Blood Culture results may not be optimal due to an excessive volume of blood received in culture bottles Performed at [place] [address] [place], [place]  [address]    Culture   Final    NO GROWTH 2 DAYS Performed at [facility], [place] [address], [place],  [address]    Report Status PENDING  Incomplete    Radiology Studies: No results found.    Shon Indelicato T. [place]  If 7PM-7AM, please contact night-coverage [email or web address] 05/31/2021, 1:05 PM

## 2021-05-31 NOTE — Plan of Care (Signed)
  Problem: Clinical Measurements: Goal: Ability to maintain clinical measurements within normal limits will improve Outcome: Progressing Goal: Will remain free from infection Outcome: Progressing Goal: Diagnostic test results will improve Outcome: Progressing   Problem: Elimination: Goal: Will not experience complications related to bowel motility Outcome: Progressing Goal: Will not experience complications related to urinary retention Outcome: Progressing

## 2021-06-01 DIAGNOSIS — D72825 Bandemia: Secondary | ICD-10-CM | POA: Diagnosis not present

## 2021-06-01 DIAGNOSIS — R410 Disorientation, unspecified: Secondary | ICD-10-CM | POA: Diagnosis not present

## 2021-06-01 DIAGNOSIS — E878 Other disorders of electrolyte and fluid balance, not elsewhere classified: Secondary | ICD-10-CM | POA: Diagnosis not present

## 2021-06-01 DIAGNOSIS — E860 Dehydration: Secondary | ICD-10-CM | POA: Diagnosis not present

## 2021-06-01 LAB — GASTROINTESTINAL PANEL BY PCR, STOOL (REPLACES STOOL CULTURE)

## 2021-06-01 LAB — RENAL FUNCTION PANEL
Albumin: 2.5 g/dL — ABNORMAL LOW (ref 3.5–5.0)
Anion gap: 4 — ABNORMAL LOW (ref 5–15)
BUN: 11 mg/dL (ref 8–23)
CO2: 27 mmol/L (ref 22–32)
Calcium: 9.4 mg/dL (ref 8.9–10.3)
Chloride: 108 mmol/L (ref 98–111)
Creatinine, Ser: 0.5 mg/dL (ref 0.44–1.00)
GFR, Estimated: >60 mL/min (ref >60)
Glucose, Bld: 126 mg/dL — ABNORMAL HIGH (ref 70–99)
Phosphorus: 1.5 mg/dL — ABNORMAL LOW (ref 2.5–4.6)
Potassium: 4.4 mmol/L (ref 3.5–5.1)
Sodium: 139 mmol/L (ref 135–145)

## 2021-06-01 LAB — CBC
HCT: 37 % (ref 36.0–46.0)
Hemoglobin: 11.8 g/dL — ABNORMAL LOW (ref 12.0–15.0)
MCH: 27.9 pg (ref 26.0–34.0)
MCHC: 31.9 g/dL (ref 30.0–36.0)
MCV: 87.5 fL (ref 80.0–100.0)
Platelets: 244 10*3/uL (ref 150–400)
RBC: 4.23 MIL/uL (ref 3.87–5.11)
RDW: 16.9 % — ABNORMAL HIGH (ref 11.5–15.5)
WBC: 10.9 10*3/uL — ABNORMAL HIGH (ref 4.0–10.5)
nRBC: 0 % (ref 0.0–0.2)

## 2021-06-01 LAB — MAGNESIUM: Magnesium: 2.4 mg/dL (ref 1.7–2.4)

## 2021-06-01 MED ORDER — SODIUM PHOSPHATES 45 MMOLE/15ML IV SOLN
30.0000 mmol | Freq: Once | INTRAVENOUS | Status: AC
  Administered 2021-06-01: 30 mmol via INTRAVENOUS
  Filled 2021-06-01: qty 10

## 2021-06-01 MED ORDER — GERHARDT'S BUTT CREAM
TOPICAL_CREAM | Freq: Two times a day (BID) | CUTANEOUS | Status: DC
  Filled 2021-06-01: qty 1

## 2021-06-01 MED ORDER — PREDNISONE 10 MG PO TABS
10.0000 mg | ORAL_TABLET | Freq: Every day | ORAL | Status: DC
  Administered 2021-06-02: 10 mg via ORAL
  Filled 2021-06-01: qty 1

## 2021-06-01 NOTE — NC FL2 (Signed)
[place] LEVEL OF CARE SCREENING TOOL     IDENTIFICATION  Patient Name: Cynthia Perry Birthdate: [birth year] Sex: female Admission Date (Current Location): 05/29/2021  [facility] and [place] Number:  Herbalist and Address:  [facility],  [place] [address], [place]      Provider Number: [id number]  Attending Physician Name and Address:  Mercy Riding, MD  Relative Name and Phone Number:       Current Level of Care: Hospital Recommended Level of Care: Memory Care Prior Approval Number:    Date Approved/Denied:   PASRR Number: pending  Discharge Plan: Other (Comment) (memory care unit)    Current Diagnoses: Patient Active Problem List   Diagnosis Date Noted   Hyperthyroidism 05/31/2021   Hypokalemia, hypomagnesemia, hypophosphatemia, hyponatremia 05/30/2021   Hypophosphatemia 05/30/2021   Ulcerative colitis without complications ([place]) 62/37/6283   Bandemia 05/30/2021   Delirium 05/29/2021   Hypokalemia 05/29/2021   Hypomagnesemia 05/29/2021   Dehydration 05/29/2021   Leukocytosis 05/29/2021   Protein-calorie malnutrition, severe ([place]) 05/29/2021   Weight loss 05/10/2021   Xerostomia 05/10/2021   Major neurocognitive disorder due to Alzheimer's disease 12/30/2020   Left upper quadrant abdominal mass    Generalized abdominal pain    Hypercalcemia    Left sided ulcerative colitis    Tachycardia    Hypotension due to drugs    Generalized anxiety disorder    History of ASCVD    Vitamin B12 deficiency    Debilitated     Orientation RESPIRATION BLADDER Height & Weight     Self  Normal Continent Weight: 44 kg Height:  5' 3"  (160 cm)  BEHAVIORAL SYMPTOMS/MOOD NEUROLOGICAL BOWEL NUTRITION STATUS      Continent Diet (regular)  AMBULATORY STATUS COMMUNICATION OF NEEDS Skin   Total Care Verbally Normal                       Personal Care Assistance Level of Assistance  Bathing, Feeding, Dressing Bathing Assistance:  Maximum assistance Feeding assistance: Maximum assistance Dressing Assistance: Maximum assistance     Functional Limitations Info  Sight, Hearing, Speech Sight Info: Adequate Hearing Info: Adequate Speech Info: Adequate    SPECIAL CARE FACTORS FREQUENCY  PT (By licensed PT), OT (By licensed OT)     PT Frequency: 5 x weekly OT Frequency: 5 xweekly            Contractures Contractures Info: Not present    Additional Factors Info  Code Status Code Status Info: full             Current Medications (06/01/2021):  This is the current hospital active medication list Current Facility-Administered Medications  Medication Dose Route Frequency Provider Last Rate Last Admin   acetaminophen (TYLENOL) tablet 650 mg  650 mg Oral Q6H PRN Marylyn Ishihara, Tyrone A, DO       Or   acetaminophen (TYLENOL) suppository 650 mg  650 mg Rectal Q6H PRN Marylyn Ishihara, Tyrone A, DO       cefTRIAXone (ROCEPHIN) 2 g in sodium chloride 0.9 % 100 mL IVPB  2 g Intravenous Q24H Kyle, Tyrone A, DO 200 mL/hr at 05/31/21 1127 2 g at 05/31/21 1127   enoxaparin (LOVENOX) injection 30 mg  30 mg Subcutaneous Q24H Wendee Beavers T, MD   30 mg at 05/31/21 0903   feeding supplement (ENSURE ENLIVE / ENSURE PLUS) liquid 237 mL  237 mL Oral BID BM Mercy Riding, MD   237  mL at 05/31/21 1738   haloperidol lactate (HALDOL) injection 2 mg  2 mg Intravenous Q6H PRN Wendee Beavers T, MD   2 mg at 05/30/21 1728   HYDROcodone-acetaminophen (NORCO/VICODIN) 5-325 MG per tablet 1-2 tablet  1-2 tablet Oral Q4H PRN Marylyn Ishihara, Tyrone A, DO       memantine (NAMENDA) tablet 5 mg  5 mg Oral QHS Kyle, Tyrone A, DO   5 mg at 05/31/21 2133   mesalamine (PENTASA) CR capsule 1,500 mg  1,500 mg Oral Daily Marylyn Ishihara, Tyrone A, DO   1,500 mg at 05/31/21 5809   multivitamin with minerals tablet 1 tablet  1 tablet Oral Daily Wendee Beavers T, MD   1 tablet at 05/31/21 1410   predniSONE (DELTASONE) tablet 20 mg  20 mg Oral Q breakfast Marylyn Ishihara, Tyrone A, DO   20 mg at 05/31/21 9833    Followed by   Derrill Memo ON 06/04/2021] predniSONE (DELTASONE) tablet 10 mg  10 mg Oral Q breakfast Kyle, Tyrone A, DO       QUEtiapine (SEROQUEL) tablet 12.5 mg  12.5 mg Oral Daily Gonfa, Taye T, MD       And   QUEtiapine (SEROQUEL) tablet 25 mg  25 mg Oral QHS Gonfa, Taye T, MD   25 mg at 05/31/21 2132   sodium phosphate 30 mmol in dextrose 5 % 250 mL infusion  30 mmol Intravenous Once Mercy Riding, MD         Discharge Medications: Please see discharge summary for a list of discharge medications.  Relevant Imaging Results:  Relevant Lab Results:   Additional Information [id number]  Leeroy Cha, RN

## 2021-06-01 NOTE — Evaluation (Signed)
Physical Therapy Evaluation Patient Details Name: Cynthia Perry MRN: [id number] DOB: 1945-12-27 Today's Date: 06/01/2021  History of Present Illness  Pt is a 76 year old female with PMH of Alzheimer's dementia, ulcerative colitis and anxiety recently started on prednisone for ulcerative colitis flare brought to ED by EMS due to confusion, decreased p.o. intake, delusion and paranoia, and admitted 05/29/21 for delirium, dehydration and electrolyte abnormalities including hypokalemia, hypomagnesemia and hypophosphatemia  Clinical Impression  Pt admitted with above diagnosis.  Pt currently with functional limitations due to the deficits listed below (see PT Problem List). Pt will benefit from skilled PT to increase their independence and safety with mobility to allow discharge to the venue listed below.  Pt pleasantly confused and cooperative.  Per OT evaluation, pt has 24/7 assist upon d/c.  Pt assisted with multiple sit to stands for pericare and linen change however continually having BM so deferred gait at this time.       Recommendations for follow up therapy are one component of a multi-disciplinary discharge planning process, led by the attending physician.  Recommendations may be updated based on patient status, additional functional criteria and insurance authorization.  Follow Up Recommendations Home health PT    Assistance Recommended at Discharge Frequent or constant Supervision/Assistance  Patient can return home with the following  A little help with walking and/or transfers;A lot of help with bathing/dressing/bathroom    Equipment Recommendations None recommended by PT  Recommendations for Other Services       Functional Status Assessment Patient has had a recent decline in their functional status and demonstrates the ability to make significant improvements in function in a reasonable and predictable amount of time.     Precautions / Restrictions Precautions Precautions:  Fall Precaution Comments: bowel incontinence Restrictions Weight Bearing Restrictions: No      Mobility  Bed Mobility Overal bed mobility: Needs Assistance Bed Mobility: Supine to Sit     Supine to sit: Min assist, HOB elevated          Transfers Overall transfer level: Needs assistance   Transfers: Sit to/from Stand, Bed to chair/wheelchair/BSC Sit to Stand: Min assist Stand pivot transfers: Min assist         General transfer comment: multimodal cues for technique and positioning, nurse tech assisted with pericare and bed linen change; performed sit to stands x4    Ambulation/Gait               General Gait Details: pt with loose BM with standing so deferred gait at this time  Stairs            Wheelchair Mobility    Modified Rankin (Stroke Patients Only)       Balance                                             Pertinent Vitals/Pain Pain Assessment Pain Assessment: No/denies pain    Home Living Family/patient expects to be discharged to:: Private residence Living Arrangements: Spouse/significant other Available Help at Discharge: Family;Available 24 hours/day (husband, children) Type of Home: House Home Access: Stairs to enter       Home Layout: Two level Home Equipment: BSC/3in1;Shower seat;Grab bars - tub/shower;Grab bars - toilet Additional Comments: supervision for all ADL/assist for IADL due to cognitive status    Prior Function Prior Level of Function : Needs assist  Mobility Comments: recent worsening balance per daugther ADLs Comments: can perform ADLs with supervision-SET UP; IADLs with assist     Hand Dominance        Extremity/Trunk Assessment   Upper Extremity Assessment Upper Extremity Assessment: Generalized weakness    Lower Extremity Assessment Lower Extremity Assessment: Generalized weakness    Cervical / Trunk Assessment Cervical / Trunk Assessment: Kyphotic   Communication   Communication: No difficulties  Cognition Arousal/Alertness: Awake/alert Behavior During Therapy: WFL for tasks assessed/performed Overall Cognitive Status: Impaired/Different from baseline Area of Impairment: Orientation, Attention, Memory, Following commands, Safety/judgement, Awareness, Problem solving                 Orientation Level: Disoriented to, Time, Situation Current Attention Level: Focused Memory: Decreased recall of precautions, Decreased short-term memory Following Commands: Follows one step commands with increased time Safety/Judgement: Decreased awareness of safety, Decreased awareness of deficits Awareness: Intellectual Problem Solving: Slow processing, Requires verbal cues, Requires tactile cues          General Comments General comments (skin integrity, edema, etc.): pt with redness throughout buttocks. RN aware, applied barrier cream    Exercises     Assessment/Plan    PT Assessment Patient needs continued PT services  PT Problem List Decreased strength;Decreased activity tolerance;Decreased mobility;Decreased balance;Decreased knowledge of use of DME       PT Treatment Interventions Gait training;DME instruction;Therapeutic exercise;Balance training;Functional mobility training;Therapeutic activities;Patient/family education    PT Goals (Current goals can be found in the Care Plan section)  Acute Rehab PT Goals PT Goal Formulation: Patient unable to participate in goal setting Time For Goal Achievement: 06/15/21 Potential to Achieve Goals: Good    Frequency Min 3X/week     Co-evaluation               AM-PAC PT "6 Clicks" Mobility  Outcome Measure Help needed turning from your back to your side while in a flat bed without using bedrails?: A Little Help needed moving from lying on your back to sitting on the side of a flat bed without using bedrails?: A Little Help needed moving to and from a bed to a chair  (including a wheelchair)?: A Little Help needed standing up from a chair using your arms (e.g., wheelchair or bedside chair)?: A Little Help needed to walk in hospital room?: A Lot Help needed climbing 3-5 steps with a railing? : A Lot 6 Click Score: 16    End of Session Equipment Utilized During Treatment: Gait belt Activity Tolerance: Patient tolerated treatment well Patient left: in chair;with call bell/phone within reach;with nursing/sitter in room;with chair alarm set   PT Visit Diagnosis: Other abnormalities of gait and mobility (R26.89)    Time: 5284-1324 PT Time Calculation (min) (ACUTE ONLY): 17 min   Charges:   PT Evaluation $PT Eval Low Complexity: 1 Low         Kati PT, DPT Acute Rehabilitation Services Pager: [phone] Office: [place] 06/01/2021, 2:02 PM

## 2021-06-01 NOTE — Progress Notes (Signed)
IV leaking, IV team notified for restart, attempt to restart, unsuccessful. SRP, RN

## 2021-06-01 NOTE — Evaluation (Signed)
Occupational Therapy Evaluation Patient Details Name: Cynthia Perry MRN: [id number] DOB: 08-Jul-1945 Today's Date: 06/01/2021   History of Present Illness Per EMR pt is a 76 year old female admitted with confusion, decreased p.o. intake, delusion and paranoia, and admitted for delirium, dehydration and electrolyte abnormalities including hypokalemia, hypomagnesemia and hypophosphatemia. PMH significant for Alzheimer's dementia, ulcerative colitis and anxiety recently started on prednisone for ulcerative colitis flare   Clinical Impression   Pt greeted in bed agreeable to OT evaluation. Pt is pleasant and agreeable, not oriented to time or situation. PTA pt daughter reports she amb with a cane however poor usage and neurologist recently recommended use of RW (although pt has not used yet as appt happened very recent to acute illness). Pt has 24/7 supervision from husband and adult children. At this time pt presents with deficits in strength, activity tolerance affecting optimal and safe ADL completion. Pt would benefit from [facility] upon discharge to address deficits and to facilitate sate completion of ADL/ functional mobility in the home. Pt is left in bedside chair, NAD, all needs met. OT Will follow acutely.       Recommendations for follow up therapy are one component of a multi-disciplinary discharge planning process, led by the attending physician.  Recommendations may be updated based on patient status, additional functional criteria and insurance authorization.   Follow Up Recommendations  Home health OT    Assistance Recommended at Discharge Frequent or constant Supervision/Assistance  Patient can return home with the following A little help with walking and/or transfers;A little help with bathing/dressing/bathroom;Assistance with cooking/housework;Direct supervision/assist for financial management;Direct supervision/assist for medications management;Help with stairs or ramp for entrance;Assist  for transportation    Functional Status Assessment  Patient has had a recent decline in their functional status and demonstrates the ability to make significant improvements in function in a reasonable and predictable amount of time.  Equipment Recommendations  None recommended by OT;Other (comment) (pt has recommended equipment)    Recommendations for Other Services       Precautions / Restrictions Precautions Precautions: Fall Restrictions Weight Bearing Restrictions: No      Mobility Bed Mobility Overal bed mobility: Needs Assistance Bed Mobility: Supine to Sit     Supine to sit: Min assist, HOB elevated          Transfers Overall transfer level: Needs assistance   Transfers: Sit to/from Stand Sit to Stand: Min assist                  Balance Overall balance assessment: Needs assistance Sitting-balance support: Feet supported Sitting balance-Leahy Scale: Good     Standing balance support: During functional activity, Single extremity supported Standing balance-Leahy Scale: Fair                             ADL either performed or assessed with clinical judgement   ADL Overall ADL's : Needs assistance/impaired     Grooming: Wash/dry hands;Wash/dry face;Sitting;Set up           Upper Body Dressing : Minimal assistance;Sitting Upper Body Dressing Details (indicate cue type and reason): donn/doff gown Lower Body Dressing: Maximal assistance Lower Body Dressing Details (indicate cue type and reason): donn socks Toilet Transfer: Minimal assistance Toilet Transfer Details (indicate cue type and reason): HHA short amb transfer Toileting- Clothing Manipulation and Hygiene: Maximal assistance;Sit to/from stand Toileting - Clothing Manipulation Details (indicate cue type and reason): for peri care after BM  Functional mobility during ADLs: Minimal assistance (short amb transfers from bed>BSC>bedside chair with HHA and RW with MIN A, step  by step vcs)       Vision Patient Visual Report: No change from baseline Additional Comments: pt has baseline visual impairment per daugther in L eye     Perception     Praxis      Pertinent Vitals/Pain Pain Assessment Pain Assessment: No/denies pain     Hand Dominance     Extremity/Trunk Assessment Upper Extremity Assessment Upper Extremity Assessment: Generalized weakness   Lower Extremity Assessment Lower Extremity Assessment: Generalized weakness   Cervical / Trunk Assessment Cervical / Trunk Assessment: Kyphotic   Communication Communication Communication: No difficulties   Cognition Arousal/Alertness: Awake/alert Behavior During Therapy: Flat affect Overall Cognitive Status: Impaired/Different from baseline Area of Impairment: Orientation, Attention, Memory, Following commands, Safety/judgement, Awareness, Problem solving                 Orientation Level: Disoriented to, Time, Situation Current Attention Level: Focused Memory: Decreased recall of precautions, Decreased short-term memory Following Commands: Follows one step commands with increased time Safety/Judgement: Decreased awareness of safety, Decreased awareness of deficits Awareness: Intellectual Problem Solving: Slow processing, Requires verbal cues, Requires tactile cues       General Comments  pt with redness throughout buttocks. RN aware, applied barrier cream    Exercises     Shoulder Instructions      Home Living Family/patient expects to be discharged to:: Private residence Living Arrangements: Spouse/significant other Available Help at Discharge: Family;Available 24 hours/day (husband, children) Type of Home: House Home Access: Stairs to enter     Home Layout: Two level         Biochemist, clinical: Standard     Home Equipment: BSC/3in1;Shower seat;Grab bars - tub/shower;Grab bars - toilet   Additional Comments: supervision for all ADL/assist for IADL due to cognitive  status      Prior Functioning/Environment Prior Level of Function : Needs assist             Mobility Comments: recent worsening balance per daugther ADLs Comments: can perform ADLs with supervision-SET UP; IADLs with assist        OT Problem List: Decreased activity tolerance;Decreased strength;Decreased cognition      OT Treatment/Interventions: Self-care/ADL training;Patient/family education;Energy conservation;DME and/or AE instruction;Therapeutic activities;Balance training    OT Goals(Current goals can be found in the care plan section) Acute Rehab OT Goals Patient Stated Goal: go home OT Goal Formulation: With patient/family Time For Goal Achievement: 06/15/21 Potential to Achieve Goals: Good ADL Goals Pt Will Perform Grooming: with supervision;sitting;standing Pt Will Perform Lower Body Dressing: with supervision;sit to/from stand Pt Will Transfer to Toilet: with supervision;ambulating Pt Will Perform Toileting - Clothing Manipulation and hygiene: with supervision;sit to/from stand  OT Frequency: Min 2X/week    Co-evaluation              AM-PAC OT "6 Clicks" Daily Activity     Outcome Measure Help from another person eating meals?: None Help from another person taking care of personal grooming?: None Help from another person toileting, which includes using toliet, bedpan, or urinal?: A Lot Help from another person bathing (including washing, rinsing, drying)?: A Lot Help from another person to put on and taking off regular upper body clothing?: A Little Help from another person to put on and taking off regular lower body clothing?: A Lot 6 Click Score: 17   End of Session Equipment Utilized During Treatment: Rolling  walker (2 wheels) Nurse Communication: Mobility status  Activity Tolerance: Patient tolerated treatment well Patient left: in chair;with call bell/phone within reach;with chair alarm set;with nursing/sitter in room  OT Visit Diagnosis:  Muscle weakness (generalized) (M62.81);Unsteadiness on feet (R26.81)                Time: 6646-6056 OT Time Calculation (min): 32 min Charges:  OT General Charges $OT Visit: 1 Visit OT Evaluation $OT Eval Moderate Complexity: 1 Mod OT Treatments $Self Care/Home Management : 8-22 mins  Shanon Payor, OTD OTR/L  06/01/21, 10:38 AM
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
## 2021-06-01 NOTE — Progress Notes (Signed)
PROGRESS NOTE  Cynthia Perry IHK:[id number] DOB: [birth year]   PCP: Libby Maw, MD  Patient is from: Home  DOA: 05/29/2021 LOS: 3  Chief complaints Chief Complaint  Patient presents with   Altered Mental Status     Brief Narrative / Interim history: 76 year old F with PMH of Alzheimer's dementia, ulcerative colitis and anxiety recently started on prednisone for ulcerative colitis flare brought to ED by EMS due to confusion, decreased p.o. intake, delusion and paranoia, and admitted for delirium, dehydration and electrolyte abnormalities including hypokalemia, hypomagnesemia and hypophosphatemia.  She also had leukocytosis to 23 with left shift.  UA with moderate LE but no bacteria or nitrite.  Two-view chest x-ray negative.  C. difficile, GIP, blood cultures and CT abdomen and pelvis ordered.  Patient was started on IV fluid, CTX and Flagyl and admitted.   The next day, CT abdomen and pelvis concerning for active distal colitis correlating with history of ulcerative colitis.  Leukocytosis, dehydration, lytes and mental status improved.  Completed 4 days of CTX for possible UTI empirically.  GI consulted for colitis.  On low-dose Seroquel for delirium.   Likely discharge in the next 24 to 48 hours.  Subjective: Seen and examined earlier this morning.  No major events overnight of this morning.  She reports pain in her rectum.  No other complaints.  She is oriented to self and state.  She follows command.  She denies chest pain and dyspnea.  Admits abdominal pain.  Objective: Vitals:   05/31/21 1450 05/31/21 2141 06/01/21 0508 06/01/21 1018  BP: 106/67 111/73 103/83 121/78  Pulse: 88 97 83   Resp: 18 17 20    Temp: 97.6 F (36.4 C) 97.8 F (36.6 C) 97.7 F (36.5 C)   TempSrc: Oral  Oral   SpO2: 100% 98% 100%   Weight:      Height:        Examination:  GENERAL: Appears frail.  Nontoxic. HEENT: MMM.  Vision and hearing grossly intact.  NECK: Supple.  No apparent  JVD.  RESP:  No IWOB.  Fair aeration bilaterally. CVS:  RRR. Heart sounds normal.  ABD/GI/GU: BS+. Abd soft, NTND.  MSK/EXT:  Moves extremities.  Significant muscle mass and subcu fat loss. SKIN: no apparent skin lesion or wound NEURO: Awake and alert.  Oriented to self and state.  Follows commands.  No apparent focal neuro deficit. PSYCH: Calm.  Somewhat anxious.    Procedures:  None  Microbiology summarized: Blood cultures NGTD C. difficile and GIP negative.  Assessment and Plan: * Delirium Likely delirium likely provoked by steroid for ulcerative colitis,  dehydration and possible UTI.   UA with pyuria but no bacteria.  Blood cultures NGTD.  She has no focal neurodeficit to suggest CVA.  B12 within normal.  TSH low.  Free T4 slightly elevated.  Delirium seems to have improved with low-dose Seroquel.  Completed 4 days of IV ceftriaxone for possible UTI. -Continue Seroquel to 12.5 mg in the morning and 25 mg at night.  QTc 420. -IV Haldol 2 mg every 6 hours as needed -Optimize electrolytes -Recheck TFT outpatient.  No clinical signs of hyperthyroidism.  Ulcerative colitis without complications ([place]) She was started on prednisone 40 mg daily 2 weeks prior.  Currently on 20 mg daily.  CT abdomen and pelvis showed wall thickening involving the descending, sigmoid, and rectal colon in keeping with ulcerative colitis.  CRP 4.4.  C. difficile and GIP negative. -Appreciate input by GI -Wean to prednisone 10  mg daily starting 5/31 -Continue home mesalamine -Consider budesonide in the future for UC exacerbation -Outpatient follow-up with GI  Hypokalemia, hypomagnesemia, hypophosphatemia, hyponatremia Hypokalemia and hypomagnesemia resolved.  P1.5. -IV sodium phosphate 30 mmol x 1  Dehydration In the setting of delirium and diarrhea. -Encourage oral hydration  Hyperthyroidism TSH slightly low at 0.306.  TFT 1.56.  Difficult to interpret in acute setting.  Does not seem to have  clinical signs -May need to repeat TFT in about 2 to 3 weeks  -Can also consider low-dose beta-blocker if tachycardic -Discussed with patient's daughter.  Protein-calorie malnutrition, severe (HCC) Body mass index is 17.18 kg/m. Nutrition Status: Nutrition Problem: Inadequate oral intake Etiology: chronic illness, lethargy/confusion (hx Alzheimer's dementia) Signs/Symptoms: per patient/family report Interventions: Ensure Enlive (each supplement provides 350kcal and 20 grams of protein), MVI  Major neurocognitive disorder due to Alzheimer's disease See delirium. -Continue home memantine  Generalized anxiety disorder Reassurance and family support  Bandemia Most likely from steroid and hemoconcentration versus true infection.  Improved. -Recheck in the morning   DVT prophylaxis:  enoxaparin (LOVENOX) injection 30 mg Start: 05/30/21 1000  Code Status: Full code Family Communication: Updated patient's daughter over the phone Level of care: Med-Surg Status is: Inpatient Remains inpatient appropriate because: Delirium, electrolyte abnormalities and therapy evaluation to determine disposition   Final disposition: TBD Consultants:  GI-signed off  Sch Meds:  Scheduled Meds:  enoxaparin (LOVENOX) injection  30 mg Subcutaneous Q24H   feeding supplement  237 mL Oral BID BM   Gerhardt's butt cream   Topical BID   memantine  5 mg Oral QHS   mesalamine  1,500 mg Oral Daily   multivitamin with minerals  1 tablet Oral Daily   [START ON 06/02/2021] predniSONE  10 mg Oral Q breakfast   QUEtiapine  12.5 mg Oral Daily   And   QUEtiapine  25 mg Oral QHS   Continuous Infusions:  sodium phosphate  Dextrose 5% IVPB     PRN Meds:.acetaminophen **OR** acetaminophen, haloperidol lactate, HYDROcodone-acetaminophen  Antimicrobials: Anti-infectives (From admission, onward)    Start     Dose/Rate Route Frequency Ordered Stop   05/30/21 1000  cefTRIAXone (ROCEPHIN) 2 g in sodium chloride  0.9 % 100 mL IVPB  Status:  Discontinued        2 g 200 mL/hr over 30 Minutes Intravenous Every 24 hours 05/30/21 0012 06/01/21 1336   05/30/21 0012  metroNIDAZOLE (FLAGYL) IVPB 500 mg  Status:  Discontinued        500 mg 100 mL/hr over 60 Minutes Intravenous Every 12 hours 05/30/21 0012 05/30/21 1154   05/29/21 1545  cefTRIAXone (ROCEPHIN) 1 g in sodium chloride 0.9 % 100 mL IVPB        1 g 200 mL/hr over 30 Minutes Intravenous  Once 05/29/21 1531 05/29/21 1625        I have personally reviewed the following labs and images: CBC: Recent Labs  Lab 05/29/21 1310 05/30/21 0345 05/31/21 0536 06/01/21 0340  WBC 22.9* 11.8* 12.0* 10.9*  NEUTROABS 20.2*  --   --   --   HGB 12.6 12.9 11.7* 11.8*  HCT 38.4 39.8 36.8 37.0  MCV 85.0 86.0 86.6 87.5  PLT 305 315 182 244   BMP &GFR Recent Labs  Lab 05/29/21 1310 05/29/21 1950 05/30/21 0345 05/31/21 0338 06/01/21 0340  NA 139 139 133* 138 139  K 2.1* 3.5 4.0 3.2* 4.4  CL 113* 106 103 109 108  CO2 23 28 24 22  27  GLUCOSE 75 103* 90 114* 126*  BUN 10 8 7* 6* 11  CREATININE 0.61 0.55 0.49 0.39* 0.50  CALCIUM 7.7* 9.6 9.3 8.7* 9.4  MG 1.5*  --  2.6* 1.7 2.4  PHOS  --  1.3* 1.5* 2.3* 1.5*   Estimated Creatinine Clearance: 40.9 mL/min (by C-G formula based on SCr of 0.5 mg/dL). Liver & Pancreas: Recent Labs  Lab 05/29/21 1310 05/29/21 1950 05/30/21 0345 05/31/21 0338 06/01/21 0340  AST 9*  --  10*  --   --   ALT 11  --  12  --   --   ALKPHOS 51  --  64  --   --   BILITOT 0.6  --  0.8  --   --   PROT 5.0*  --  6.1*  --   --   ALBUMIN 2.3* 2.5* 2.8* 2.1* 2.5*   No results for input(s): LIPASE, AMYLASE in the last 168 hours. Recent Labs  Lab 05/29/21 1436 05/31/21 0338  AMMONIA 22 19   Diabetic: No results for input(s): HGBA1C in the last 72 hours. Recent Labs  Lab 05/29/21 1323  GLUCAP 92   Cardiac Enzymes: No results for input(s): CKTOTAL, CKMB, CKMBINDEX, TROPONINI in the last 168 hours. No results for  input(s): PROBNP in the last 8760 hours. Coagulation Profile: No results for input(s): INR, PROTIME in the last 168 hours. Thyroid Function Tests: Recent Labs    05/31/21 0338 05/31/21 0757  TSH 0.306*  --   FREET4  --  1.56*   Lipid Profile: No results for input(s): CHOL, HDL, LDLCALC, TRIG, CHOLHDL, LDLDIRECT in the last 72 hours. Anemia Panel: No results for input(s): VITAMINB12, FOLATE, FERRITIN, TIBC, IRON, RETICCTPCT in the last 72 hours. Urine analysis:    Component Value Date/Time   COLORURINE STRAW (A) 05/29/2021 1310   APPEARANCEUR CLEAR 05/29/2021 1310   LABSPEC 1.004 (L) 05/29/2021 1310   PHURINE 7.0 05/29/2021 1310   GLUCOSEU NEGATIVE 05/29/2021 1310   GLUCOSEU NEGATIVE 07/23/2020 1006   HGBUR NEGATIVE 05/29/2021 1310   BILIRUBINUR NEGATIVE 05/29/2021 1310   KETONESUR NEGATIVE 05/29/2021 1310   PROTEINUR NEGATIVE 05/29/2021 1310   UROBILINOGEN 0.2 07/23/2020 1006   NITRITE NEGATIVE 05/29/2021 1310   LEUKOCYTESUR MODERATE (A) 05/29/2021 1310   Sepsis Labs: Invalid input(s): PROCALCITONIN, [place]  Microbiology: Recent Results (from the past 240 hour(s))  Culture, blood (routine x 2)     Status: None (Preliminary result)   Collection Time: 05/29/21  2:14 PM   Specimen: BLOOD  Result Value Ref Range Status   Specimen Description   Final    BLOOD LEFT ANTECUBITAL Performed at [facility] [address] [place], [place] [address]    Special Requests   Final    BOTTLES DRAWN AEROBIC AND ANAEROBIC Blood Culture results may not be optimal due to an excessive volume of blood received in culture bottles Performed at [facility] [address] [place], [place] [address]    Culture   Final    NO GROWTH 3 DAYS Performed at [facility], [place] [address], [place], [place] [address]    Report Status PENDING  Incomplete  Culture, blood (routine x 2)     Status: None (Preliminary result)   Collection Time: 05/29/21   2:19 PM   Specimen: BLOOD RIGHT FOREARM  Result Value Ref Range Status   Specimen Description   Final    BLOOD RIGHT FOREARM Performed at [facility], [place]  [address], [place], [place] [address]    Special Requests   Final    BOTTLES DRAWN AEROBIC AND ANAEROBIC Blood Culture results may not be optimal due to an excessive volume of blood received in culture bottles Performed at [facility] [address], [place], [place] [address]    Culture   Final    NO GROWTH 3 DAYS Performed at [facility], [place] [address] [place], [place] [address]    Report Status PENDING  Incomplete  C Difficile Quick Screen w PCR reflex     Status: None   Collection Time: 05/31/21  2:18 PM   Specimen: STOOL  Result Value Ref Range Status   C Diff antigen NEGATIVE NEGATIVE Final   C Diff toxin NEGATIVE NEGATIVE Final   C Diff interpretation No C. difficile detected.  Final    Comment: Performed at [facility], [place] [address] [place], [place] [address]  Gastrointestinal Panel by PCR , Stool     Status: None   Collection Time: 05/31/21  2:18 PM   Specimen: STOOL  Result Value Ref Range Status   Campylobacter species NOT DETECTED NOT DETECTED Final   Plesimonas shigelloides NOT DETECTED NOT DETECTED Final   Salmonella species NOT DETECTED NOT DETECTED Final   Yersinia enterocolitica NOT DETECTED NOT DETECTED Final   Vibrio species NOT DETECTED NOT DETECTED Final   Vibrio cholerae NOT DETECTED NOT DETECTED Final   Enteroaggregative E coli (EAEC) NOT DETECTED NOT DETECTED Final   Enteropathogenic E coli (EPEC) NOT DETECTED NOT DETECTED Final   Enterotoxigenic E coli (ETEC) NOT DETECTED NOT DETECTED Final   Shiga like toxin producing E coli (STEC) NOT DETECTED NOT DETECTED Final   Shigella/Enteroinvasive E coli (EIEC) NOT DETECTED NOT DETECTED Final   Cryptosporidium NOT DETECTED NOT DETECTED Final   Cyclospora cayetanensis NOT DETECTED NOT DETECTED  Final   Entamoeba histolytica NOT DETECTED NOT DETECTED Final   Giardia lamblia NOT DETECTED NOT DETECTED Final   Adenovirus F40/41 NOT DETECTED NOT DETECTED Final   Astrovirus NOT DETECTED NOT DETECTED Final   Norovirus GI/GII NOT DETECTED NOT DETECTED Final   Rotavirus A NOT DETECTED NOT DETECTED Final   Sapovirus (I, II, IV, and V) NOT DETECTED NOT DETECTED Final    Comment: Performed at [facility], [address], [place],  [address]    Radiology Studies: No results found.    Claryce Friel T. [place]  If 7PM-7AM, please contact night-coverage [email or web address] 06/01/2021, 1:50 PM

## 2021-06-02 DIAGNOSIS — D72829 Elevated white blood cell count, unspecified: Secondary | ICD-10-CM | POA: Diagnosis not present

## 2021-06-02 DIAGNOSIS — D72825 Bandemia: Secondary | ICD-10-CM | POA: Diagnosis not present

## 2021-06-02 DIAGNOSIS — E8809 Other disorders of plasma-protein metabolism, not elsewhere classified: Secondary | ICD-10-CM

## 2021-06-02 DIAGNOSIS — K5100 Ulcerative (chronic) pancolitis without complications: Secondary | ICD-10-CM

## 2021-06-02 DIAGNOSIS — E878 Other disorders of electrolyte and fluid balance, not elsewhere classified: Secondary | ICD-10-CM | POA: Diagnosis not present

## 2021-06-02 DIAGNOSIS — R410 Disorientation, unspecified: Secondary | ICD-10-CM | POA: Diagnosis not present

## 2021-06-02 LAB — RENAL FUNCTION PANEL
Albumin: 2.4 g/dL — ABNORMAL LOW (ref 3.5–5.0)
Anion gap: 8 (ref 5–15)
BUN: 8 mg/dL (ref 8–23)
CO2: 26 mmol/L (ref 22–32)
Calcium: 9.6 mg/dL (ref 8.9–10.3)
Chloride: 104 mmol/L (ref 98–111)
Creatinine, Ser: 0.55 mg/dL (ref 0.44–1.00)
GFR, Estimated: >60 mL/min (ref >60)
Glucose, Bld: 108 mg/dL — ABNORMAL HIGH (ref 70–99)
Phosphorus: 2.8 mg/dL (ref 2.5–4.6)
Potassium: 3.7 mmol/L (ref 3.5–5.1)
Sodium: 138 mmol/L (ref 135–145)

## 2021-06-02 LAB — CBC
HCT: 37.1 % (ref 36.0–46.0)
Hemoglobin: 12 g/dL (ref 12.0–15.0)
MCH: 28 pg (ref 26.0–34.0)
MCHC: 32.3 g/dL (ref 30.0–36.0)
MCV: 86.7 fL (ref 80.0–100.0)
Platelets: 249 10*3/uL (ref 150–400)
RBC: 4.28 MIL/uL (ref 3.87–5.11)
RDW: 16.8 % — ABNORMAL HIGH (ref 11.5–15.5)
WBC: 9.3 10*3/uL (ref 4.0–10.5)
nRBC: 0 % (ref 0.0–0.2)

## 2021-06-02 LAB — MAGNESIUM: Magnesium: 2 mg/dL (ref 1.7–2.4)

## 2021-06-02 MED ORDER — ENSURE ENLIVE PO LIQD: 237.0000 mL | Freq: Two times a day (BID) | ORAL | 12 refills | Status: DC

## 2021-06-02 MED ORDER — QUETIAPINE FUMARATE 25 MG PO TABS: 12.5000 mg | ORAL_TABLET | Freq: Every day | ORAL | 0 refills | Status: DC

## 2021-06-02 MED ORDER — HYDROCODONE-ACETAMINOPHEN 5-325 MG PO TABS
1.0000 | ORAL_TABLET | Freq: Four times a day (QID) | ORAL | 0 refills | Status: DC | PRN
Start: 2021-06-02 — End: 2022-05-25

## 2021-06-02 MED ORDER — PREDNISONE 10 MG PO TABS: 10.0000 mg | ORAL_TABLET | Freq: Every day | ORAL | 0 refills | Status: AC

## 2021-06-02 MED ORDER — GERHARDT'S BUTT CREAM
1.0000 | TOPICAL_CREAM | Freq: Two times a day (BID) | CUTANEOUS | 0 refills | Status: DC
Start: 2021-06-02 — End: 2021-09-08

## 2021-06-02 MED ORDER — ADULT MULTIVITAMIN W/MINERALS CH: 1.0000 | ORAL_TABLET | Freq: Every day | ORAL | 0 refills | Status: DC

## 2021-06-02 MED ORDER — MELATONIN 3 MG PO TABS
3.0000 mg | ORAL_TABLET | Freq: Once | ORAL | Status: AC
  Administered 2021-06-02: 3 mg via ORAL
  Filled 2021-06-02: qty 1

## 2021-06-02 MED ORDER — QUETIAPINE FUMARATE 25 MG PO TABS: 25.0000 mg | ORAL_TABLET | Freq: Every day | ORAL | 0 refills | Status: DC

## 2021-06-02 NOTE — TOC Transition Note (Addendum)
Transition of Care [facility]) - CM/SW Discharge Note   Patient Details  Name: SHYLOH DEROSA MRN: [id number] Date of Birth: 06-Aug-1945  [facility]) CM/SW Contact:  Leeroy Cha, RN Phone Number: 06/02/2021, 2:32 PM   Clinical Narrative:    Dcd to home with physical therapy and ot through [facility].   Final next level of care: [place] Barriers to Discharge: Barriers Resolved   Patient Goals and CMS Choice Patient states their goals for this hospitalization and ongoing recovery are:: unable to state   Choice offered to / list presented to : Adult Children  Discharge Placement                       Discharge Plan and Services   Discharge Planning Services: CM Consult                                 Social Determinants of Health (SDOH) Interventions     Readmission Risk Interventions     View : No data to display.

## 2021-06-02 NOTE — TOC Progression Note (Addendum)
Transition of Care [facility]) - Progression Note    Patient Details  Name: Cynthia Perry MRN: [id number] Date of Birth: 1945-08-07  [facility]) CM/SW Contact  Leeroy Cha, RN Phone Number: 06/02/2021, 12:05 PM  Clinical Narrative:    Tct-daughter-karen talley/concerned about patient coming home at the level she has been.  Will need walker has 3 in 1 T[facility]-jennifer smith.  Will be this afternoon to pick up her sister.  Rolling walker obtained through [employer].   Expected Discharge Plan: Home/Self Care Barriers to Discharge: Continued Medical Work up  Expected Discharge Plan and Services Expected Discharge Plan: Home/Self Care   Discharge Planning Services: CM Consult   Living arrangements for the past 2 months: Single Family Home                                       Social Determinants of Health (SDOH) Interventions    Readmission Risk Interventions     View : No data to display.

## 2021-06-02 NOTE — Assessment & Plan Note (Signed)
-  In the setting of UC Flare -Improving

## 2021-06-02 NOTE — Progress Notes (Signed)
Pt discharged to home, instructions reviewed with dtg Freda Munro, a little anxious inially, gave dtg time to review paperwork and ask questions. Questions addressed. Home Health information reviewed with dtg and acknowledged understanding. SRP, RN

## 2021-06-02 NOTE — Plan of Care (Signed)
  Problem: Education: Goal: Knowledge of General Education information will improve Description Including pain rating scale, medication(s)/side effects and non-pharmacologic comfort measures Outcome: Progressing   Problem: Health Behavior/Discharge Planning: Goal: Ability to manage health-related needs will improve Outcome: Progressing   

## 2021-06-02 NOTE — Assessment & Plan Note (Signed)
-  Resolved. -WBC went from 12.0 -> 10.9 -> 9.3 -Continue to Monitor and Trend -Repeat CBC in the AM

## 2021-06-02 NOTE — Care Management Important Message (Signed)
Important Message  Patient Details IM Letter placed in Patient's room Name: Cynthia Perry MRN: [id number] Date of Birth: 03/22/1945   Medicare Important Message Given:  Yes     [place], Friend 06/02/2021, 11:44 AM

## 2021-06-02 NOTE — Progress Notes (Signed)
GI panel resulted NEGATIVE, Enteric precaution discontinued. SRP, RN

## 2021-06-02 NOTE — Assessment & Plan Note (Signed)
-  Mag Level is now 2.0 -Continue to Monitor and Trend and repeat Mag within 1 week

## 2021-06-02 NOTE — Progress Notes (Signed)
Occupational Therapy Treatment Patient Details Name: Cynthia Perry MRN: [id number] DOB: 09-Oct-1945 Today's Date: 06/02/2021   History of present illness Pt is a 76 year old female with PMH of Alzheimer's dementia, ulcerative colitis and anxiety recently started on prednisone for ulcerative colitis flare brought to ED by EMS due to confusion, decreased p.o. intake, delusion and paranoia, and admitted 05/29/21 for delirium, dehydration and electrolyte abnormalities including hypokalemia, hypomagnesemia and hypophosphatemia   OT comments  Patient overall min guard to min assist for ambulation with RW to bathroom. She needs constant verbal cues to sequence mobility and ADLs. She is able to assist with toileting though needed assistance for wiping for quality of care. She was able to stand at sink and wash her hands. She is pleasant and alert to self and hospital but has significant short term memory deficits. Continue to recommend near 24/7 assistance at home with [facility] services.   Recommendations for follow up therapy are one component of a multi-disciplinary discharge planning process, led by the attending physician.  Recommendations may be updated based on patient status, additional functional criteria and insurance authorization.    Follow Up Recommendations  Home health OT    Assistance Recommended at Discharge Frequent or constant Supervision/Assistance  Patient can return home with the following  A little help with walking and/or transfers;A little help with bathing/dressing/bathroom;Assistance with cooking/housework;Direct supervision/assist for financial management;Direct supervision/assist for medications management;Help with stairs or ramp for entrance;Assist for transportation   Equipment Recommendations  None recommended by OT    Recommendations for Other Services      Precautions / Restrictions Precautions Precautions: Fall Precaution Comments: bowel  incontinence Restrictions Weight Bearing Restrictions: No       Mobility Bed Mobility Overal bed mobility: Needs Assistance Bed Mobility: Sit to Supine     Supine to sit: Supervision          Transfers Overall transfer level: Needs assistance   Transfers: Sit to/from Stand Sit to Stand: Min assist           General transfer comment: Min guard to stand with slow rise. Min guard to ambualte to bathroom, stand at sink and ambulate to recliner. Min assist from toilet.     Balance Overall balance assessment: Needs assistance Sitting-balance support: No upper extremity supported, Feet supported Sitting balance-Leahy Scale: Good     Standing balance support: During functional activity Standing balance-Leahy Scale: Fair                             ADL either performed or assessed with clinical judgement   ADL Overall ADL's : Needs assistance/impaired                     Lower Body Dressing: Supervision/safety Lower Body Dressing Details (indicate cue type and reason): to don socks at edge of bed Toilet Transfer: Min guard;Regular Toilet;Rolling walker (2 wheels);Grab bars Toilet Transfer Details (indicate cue type and reason): verbal cues to use grab bar and sequence transfer Toileting- Clothing Manipulation and Hygiene: Minimal assistance;Sit to/from stand Toileting - Clothing Manipulation Details (indicate cue type and reason): patient needed verbal cue to sequence task, min assist to improve quality of perianal care after being found incontinent of BM. Patient wiped - but concers for vaginal contamination.     Functional mobility during ADLs: Rolling walker (2 wheels);Minimal assistance General ADL Comments: Min assist to ambulate to bathroom, toilet transfer, standing at sink and returning to  sit in recliner with use of RW. Patient needs constant verbal cues to sequence functional mobility and ADLs but is able to assist with tasks. She is able to  don her socks, manage her hospital gown and wipe herself (though concerns for frontal contamination). She was able to stand at sink and wash her hands.    Extremity/Trunk Assessment              Vision Patient Visual Report: No change from baseline     Perception     Praxis      Cognition Arousal/Alertness: Awake/alert Behavior During Therapy: WFL for tasks assessed/performed Overall Cognitive Status: History of cognitive impairments - at baseline                               Problem Solving: Requires verbal cues General Comments: Alert to self, birth date and knows she is the hospital. Needs verbal cues for sequencing ADLs and mobility.        Exercises      Shoulder Instructions       General Comments      Pertinent Vitals/ Pain       Pain Assessment Pain Assessment: No/denies pain  Home Living                                          Prior Functioning/Environment              Frequency  Min 2X/week        Progress Toward Goals  OT Goals(current goals can now be found in the care plan section)  Progress towards OT goals: Progressing toward goals  Acute Rehab OT Goals OT Goal Formulation: Patient unable to participate in goal setting Time For Goal Achievement: 06/15/21 Potential to Achieve Goals: Good  Plan Discharge plan remains appropriate    Co-evaluation                 AM-PAC OT "6 Clicks" Daily Activity     Outcome Measure   Help from another person eating meals?: None Help from another person taking care of personal grooming?: None Help from another person toileting, which includes using toliet, bedpan, or urinal?: A Little Help from another person bathing (including washing, rinsing, drying)?: A Little Help from another person to put on and taking off regular upper body clothing?: A Little Help from another person to put on and taking off regular lower body clothing?: A Little 6 Click  Score: 20    End of Session Equipment Utilized During Treatment: Rolling walker (2 wheels)  OT Visit Diagnosis: Muscle weakness (generalized) (M62.81);Unsteadiness on feet (R26.81)   Activity Tolerance Patient tolerated treatment well   Patient Left in chair;with call bell/phone within reach;with chair alarm set;with nursing/sitter in room   Nurse Communication Mobility status        Time: 8891-6945 OT Time Calculation (min): 19 min  Charges: OT General Charges $OT Visit: 1 Visit OT Treatments $Self Care/Home Management : 8-22 mins  Derl Barrow, OTR/L [place]  Office [phone] Pager: [place] 06/02/2021, 2:46 PM

## 2021-06-02 NOTE — Assessment & Plan Note (Signed)
-  Patient's Albumin Level has gone from 2.5 -> 2.8 -> 2.1 -> 2.5 -> 2.4 -Continue to Monitor and Trend -Repeat CMP in the outpatient setting
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
## 2021-06-02 NOTE — Assessment & Plan Note (Signed)
-  Improved. Phos Level went from 1.5 -> 2.8 after repletion -Continue to Monitor and Trend -Repeat Phos Level within 1 week

## 2021-06-03 LAB — CULTURE, BLOOD (ROUTINE X 2)
Culture: NO GROWTH
Culture: NO GROWTH

## 2021-06-04 ENCOUNTER — Telehealth: Payer: Self-pay | Admitting: Family Medicine

## 2021-06-04 DIAGNOSIS — R000 Tachycardia, unspecified: Secondary | ICD-10-CM | POA: Diagnosis not present

## 2021-06-04 DIAGNOSIS — R5381 Other malaise: Secondary | ICD-10-CM | POA: Diagnosis not present

## 2021-06-04 DIAGNOSIS — Z8679 Personal history of other diseases of the circulatory system: Secondary | ICD-10-CM | POA: Diagnosis not present

## 2021-06-04 DIAGNOSIS — F411 Generalized anxiety disorder: Secondary | ICD-10-CM | POA: Diagnosis not present

## 2021-06-04 DIAGNOSIS — E0590 Thyrotoxicosis, unspecified without thyrotoxic crisis or storm: Secondary | ICD-10-CM | POA: Diagnosis not present

## 2021-06-04 DIAGNOSIS — E538 Deficiency of other specified B group vitamins: Secondary | ICD-10-CM | POA: Diagnosis not present

## 2021-06-04 DIAGNOSIS — R634 Abnormal weight loss: Secondary | ICD-10-CM | POA: Diagnosis not present

## 2021-06-04 DIAGNOSIS — G309 Alzheimer's disease, unspecified: Secondary | ICD-10-CM | POA: Diagnosis not present

## 2021-06-04 DIAGNOSIS — F0280 Dementia in other diseases classified elsewhere without behavioral disturbance: Secondary | ICD-10-CM | POA: Diagnosis not present

## 2021-06-04 DIAGNOSIS — R1902 Left upper quadrant abdominal swelling, mass and lump: Secondary | ICD-10-CM | POA: Diagnosis not present

## 2021-06-04 DIAGNOSIS — E43 Unspecified severe protein-calorie malnutrition: Secondary | ICD-10-CM | POA: Diagnosis not present

## 2021-06-04 DIAGNOSIS — K5150 Left sided colitis without complications: Secondary | ICD-10-CM | POA: Diagnosis not present

## 2021-06-04 NOTE — Telephone Encounter (Signed)
Verbal given 

## 2021-06-04 NOTE — Telephone Encounter (Signed)
[place] from [facility] is requesting an order for [facility] PT 1xw =1 2xw=2 1xw =2.Can leave a message on this number.

## 2021-06-07 DIAGNOSIS — K5150 Left sided colitis without complications: Secondary | ICD-10-CM | POA: Diagnosis not present

## 2021-06-07 DIAGNOSIS — R1032 Left lower quadrant pain: Secondary | ICD-10-CM | POA: Diagnosis not present

## 2021-06-09 DIAGNOSIS — K5150 Left sided colitis without complications: Secondary | ICD-10-CM | POA: Diagnosis not present

## 2021-06-09 DIAGNOSIS — F0280 Dementia in other diseases classified elsewhere without behavioral disturbance: Secondary | ICD-10-CM | POA: Diagnosis not present

## 2021-06-09 DIAGNOSIS — E0590 Thyrotoxicosis, unspecified without thyrotoxic crisis or storm: Secondary | ICD-10-CM | POA: Diagnosis not present

## 2021-06-09 DIAGNOSIS — G309 Alzheimer's disease, unspecified: Secondary | ICD-10-CM | POA: Diagnosis not present

## 2021-06-09 DIAGNOSIS — E43 Unspecified severe protein-calorie malnutrition: Secondary | ICD-10-CM | POA: Diagnosis not present

## 2021-06-09 DIAGNOSIS — R000 Tachycardia, unspecified: Secondary | ICD-10-CM | POA: Diagnosis not present

## 2021-06-11 DIAGNOSIS — E43 Unspecified severe protein-calorie malnutrition: Secondary | ICD-10-CM | POA: Diagnosis not present

## 2021-06-11 DIAGNOSIS — R000 Tachycardia, unspecified: Secondary | ICD-10-CM | POA: Diagnosis not present

## 2021-06-11 DIAGNOSIS — E0590 Thyrotoxicosis, unspecified without thyrotoxic crisis or storm: Secondary | ICD-10-CM | POA: Diagnosis not present

## 2021-06-11 DIAGNOSIS — G309 Alzheimer's disease, unspecified: Secondary | ICD-10-CM | POA: Diagnosis not present

## 2021-06-11 DIAGNOSIS — K5150 Left sided colitis without complications: Secondary | ICD-10-CM | POA: Diagnosis not present

## 2021-06-11 DIAGNOSIS — F0280 Dementia in other diseases classified elsewhere without behavioral disturbance: Secondary | ICD-10-CM | POA: Diagnosis not present

## 2021-06-15 DIAGNOSIS — E43 Unspecified severe protein-calorie malnutrition: Secondary | ICD-10-CM | POA: Diagnosis not present

## 2021-06-15 DIAGNOSIS — F0280 Dementia in other diseases classified elsewhere without behavioral disturbance: Secondary | ICD-10-CM | POA: Diagnosis not present

## 2021-06-15 DIAGNOSIS — K5150 Left sided colitis without complications: Secondary | ICD-10-CM | POA: Diagnosis not present

## 2021-06-15 DIAGNOSIS — R000 Tachycardia, unspecified: Secondary | ICD-10-CM | POA: Diagnosis not present

## 2021-06-15 DIAGNOSIS — E0590 Thyrotoxicosis, unspecified without thyrotoxic crisis or storm: Secondary | ICD-10-CM | POA: Diagnosis not present

## 2021-06-15 DIAGNOSIS — G309 Alzheimer's disease, unspecified: Secondary | ICD-10-CM | POA: Diagnosis not present

## 2021-06-16 DIAGNOSIS — K5150 Left sided colitis without complications: Secondary | ICD-10-CM | POA: Diagnosis not present

## 2021-06-16 DIAGNOSIS — F0280 Dementia in other diseases classified elsewhere without behavioral disturbance: Secondary | ICD-10-CM | POA: Diagnosis not present

## 2021-06-16 DIAGNOSIS — E43 Unspecified severe protein-calorie malnutrition: Secondary | ICD-10-CM | POA: Diagnosis not present

## 2021-06-16 DIAGNOSIS — E0590 Thyrotoxicosis, unspecified without thyrotoxic crisis or storm: Secondary | ICD-10-CM | POA: Diagnosis not present

## 2021-06-16 DIAGNOSIS — G309 Alzheimer's disease, unspecified: Secondary | ICD-10-CM | POA: Diagnosis not present

## 2021-06-16 DIAGNOSIS — R000 Tachycardia, unspecified: Secondary | ICD-10-CM | POA: Diagnosis not present

## 2021-06-17 DIAGNOSIS — K5150 Left sided colitis without complications: Secondary | ICD-10-CM | POA: Diagnosis not present

## 2021-06-17 DIAGNOSIS — G309 Alzheimer's disease, unspecified: Secondary | ICD-10-CM | POA: Diagnosis not present

## 2021-06-17 DIAGNOSIS — E43 Unspecified severe protein-calorie malnutrition: Secondary | ICD-10-CM | POA: Diagnosis not present

## 2021-06-17 DIAGNOSIS — E0590 Thyrotoxicosis, unspecified without thyrotoxic crisis or storm: Secondary | ICD-10-CM | POA: Diagnosis not present

## 2021-06-17 DIAGNOSIS — R000 Tachycardia, unspecified: Secondary | ICD-10-CM | POA: Diagnosis not present

## 2021-06-17 DIAGNOSIS — F0280 Dementia in other diseases classified elsewhere without behavioral disturbance: Secondary | ICD-10-CM | POA: Diagnosis not present

## 2021-06-22 DIAGNOSIS — E43 Unspecified severe protein-calorie malnutrition: Secondary | ICD-10-CM | POA: Diagnosis not present

## 2021-06-22 DIAGNOSIS — G309 Alzheimer's disease, unspecified: Secondary | ICD-10-CM | POA: Diagnosis not present

## 2021-06-22 DIAGNOSIS — K5150 Left sided colitis without complications: Secondary | ICD-10-CM | POA: Diagnosis not present

## 2021-06-22 DIAGNOSIS — F0280 Dementia in other diseases classified elsewhere without behavioral disturbance: Secondary | ICD-10-CM | POA: Diagnosis not present

## 2021-06-22 DIAGNOSIS — E0590 Thyrotoxicosis, unspecified without thyrotoxic crisis or storm: Secondary | ICD-10-CM | POA: Diagnosis not present

## 2021-06-22 DIAGNOSIS — R000 Tachycardia, unspecified: Secondary | ICD-10-CM | POA: Diagnosis not present

## 2021-06-23 DIAGNOSIS — F0280 Dementia in other diseases classified elsewhere without behavioral disturbance: Secondary | ICD-10-CM | POA: Diagnosis not present

## 2021-06-23 DIAGNOSIS — K5150 Left sided colitis without complications: Secondary | ICD-10-CM | POA: Diagnosis not present

## 2021-06-23 DIAGNOSIS — R000 Tachycardia, unspecified: Secondary | ICD-10-CM | POA: Diagnosis not present

## 2021-06-23 DIAGNOSIS — E43 Unspecified severe protein-calorie malnutrition: Secondary | ICD-10-CM | POA: Diagnosis not present

## 2021-06-23 DIAGNOSIS — G309 Alzheimer's disease, unspecified: Secondary | ICD-10-CM | POA: Diagnosis not present

## 2021-06-23 DIAGNOSIS — E0590 Thyrotoxicosis, unspecified without thyrotoxic crisis or storm: Secondary | ICD-10-CM | POA: Diagnosis not present

## 2021-06-25 DIAGNOSIS — E43 Unspecified severe protein-calorie malnutrition: Secondary | ICD-10-CM | POA: Diagnosis not present

## 2021-06-25 DIAGNOSIS — R000 Tachycardia, unspecified: Secondary | ICD-10-CM | POA: Diagnosis not present

## 2021-06-25 DIAGNOSIS — G309 Alzheimer's disease, unspecified: Secondary | ICD-10-CM | POA: Diagnosis not present

## 2021-06-25 DIAGNOSIS — K5150 Left sided colitis without complications: Secondary | ICD-10-CM | POA: Diagnosis not present

## 2021-06-25 DIAGNOSIS — E0590 Thyrotoxicosis, unspecified without thyrotoxic crisis or storm: Secondary | ICD-10-CM | POA: Diagnosis not present

## 2021-06-25 DIAGNOSIS — F0280 Dementia in other diseases classified elsewhere without behavioral disturbance: Secondary | ICD-10-CM | POA: Diagnosis not present

## 2021-06-28 DIAGNOSIS — R000 Tachycardia, unspecified: Secondary | ICD-10-CM | POA: Diagnosis not present

## 2021-06-28 DIAGNOSIS — E43 Unspecified severe protein-calorie malnutrition: Secondary | ICD-10-CM | POA: Diagnosis not present

## 2021-06-28 DIAGNOSIS — K5150 Left sided colitis without complications: Secondary | ICD-10-CM | POA: Diagnosis not present

## 2021-06-28 DIAGNOSIS — G309 Alzheimer's disease, unspecified: Secondary | ICD-10-CM | POA: Diagnosis not present

## 2021-06-28 DIAGNOSIS — F0280 Dementia in other diseases classified elsewhere without behavioral disturbance: Secondary | ICD-10-CM | POA: Diagnosis not present

## 2021-06-28 DIAGNOSIS — E0590 Thyrotoxicosis, unspecified without thyrotoxic crisis or storm: Secondary | ICD-10-CM | POA: Diagnosis not present

## 2021-06-29 DIAGNOSIS — R000 Tachycardia, unspecified: Secondary | ICD-10-CM | POA: Diagnosis not present

## 2021-06-29 DIAGNOSIS — E0590 Thyrotoxicosis, unspecified without thyrotoxic crisis or storm: Secondary | ICD-10-CM | POA: Diagnosis not present

## 2021-06-29 DIAGNOSIS — G309 Alzheimer's disease, unspecified: Secondary | ICD-10-CM | POA: Diagnosis not present

## 2021-06-29 DIAGNOSIS — F0280 Dementia in other diseases classified elsewhere without behavioral disturbance: Secondary | ICD-10-CM | POA: Diagnosis not present

## 2021-06-29 DIAGNOSIS — K5150 Left sided colitis without complications: Secondary | ICD-10-CM | POA: Diagnosis not present

## 2021-06-29 DIAGNOSIS — E43 Unspecified severe protein-calorie malnutrition: Secondary | ICD-10-CM | POA: Diagnosis not present

## 2021-06-30 DIAGNOSIS — G309 Alzheimer's disease, unspecified: Secondary | ICD-10-CM | POA: Diagnosis not present

## 2021-06-30 DIAGNOSIS — K5150 Left sided colitis without complications: Secondary | ICD-10-CM | POA: Diagnosis not present

## 2021-06-30 DIAGNOSIS — E43 Unspecified severe protein-calorie malnutrition: Secondary | ICD-10-CM | POA: Diagnosis not present

## 2021-06-30 DIAGNOSIS — E0590 Thyrotoxicosis, unspecified without thyrotoxic crisis or storm: Secondary | ICD-10-CM | POA: Diagnosis not present

## 2021-06-30 DIAGNOSIS — F0280 Dementia in other diseases classified elsewhere without behavioral disturbance: Secondary | ICD-10-CM | POA: Diagnosis not present

## 2021-06-30 DIAGNOSIS — R000 Tachycardia, unspecified: Secondary | ICD-10-CM | POA: Diagnosis not present

## 2021-07-04 DIAGNOSIS — K5150 Left sided colitis without complications: Secondary | ICD-10-CM | POA: Diagnosis not present

## 2021-07-04 DIAGNOSIS — E43 Unspecified severe protein-calorie malnutrition: Secondary | ICD-10-CM | POA: Diagnosis not present

## 2021-07-04 DIAGNOSIS — G309 Alzheimer's disease, unspecified: Secondary | ICD-10-CM | POA: Diagnosis not present

## 2021-07-04 DIAGNOSIS — R634 Abnormal weight loss: Secondary | ICD-10-CM | POA: Diagnosis not present

## 2021-07-04 DIAGNOSIS — R000 Tachycardia, unspecified: Secondary | ICD-10-CM | POA: Diagnosis not present

## 2021-07-04 DIAGNOSIS — R1902 Left upper quadrant abdominal swelling, mass and lump: Secondary | ICD-10-CM | POA: Diagnosis not present

## 2021-07-04 DIAGNOSIS — F0280 Dementia in other diseases classified elsewhere without behavioral disturbance: Secondary | ICD-10-CM | POA: Diagnosis not present

## 2021-07-04 DIAGNOSIS — Z8679 Personal history of other diseases of the circulatory system: Secondary | ICD-10-CM | POA: Diagnosis not present

## 2021-07-04 DIAGNOSIS — E0590 Thyrotoxicosis, unspecified without thyrotoxic crisis or storm: Secondary | ICD-10-CM | POA: Diagnosis not present

## 2021-07-04 DIAGNOSIS — F411 Generalized anxiety disorder: Secondary | ICD-10-CM | POA: Diagnosis not present

## 2021-07-04 DIAGNOSIS — R5381 Other malaise: Secondary | ICD-10-CM | POA: Diagnosis not present

## 2021-07-04 DIAGNOSIS — E538 Deficiency of other specified B group vitamins: Secondary | ICD-10-CM | POA: Diagnosis not present

## 2021-07-07 DIAGNOSIS — F0280 Dementia in other diseases classified elsewhere without behavioral disturbance: Secondary | ICD-10-CM | POA: Diagnosis not present

## 2021-07-07 DIAGNOSIS — E43 Unspecified severe protein-calorie malnutrition: Secondary | ICD-10-CM | POA: Diagnosis not present

## 2021-07-07 DIAGNOSIS — K5150 Left sided colitis without complications: Secondary | ICD-10-CM | POA: Diagnosis not present

## 2021-07-07 DIAGNOSIS — E0590 Thyrotoxicosis, unspecified without thyrotoxic crisis or storm: Secondary | ICD-10-CM | POA: Diagnosis not present

## 2021-07-07 DIAGNOSIS — G309 Alzheimer's disease, unspecified: Secondary | ICD-10-CM | POA: Diagnosis not present

## 2021-07-07 DIAGNOSIS — R000 Tachycardia, unspecified: Secondary | ICD-10-CM | POA: Diagnosis not present

## 2021-07-08 DIAGNOSIS — E43 Unspecified severe protein-calorie malnutrition: Secondary | ICD-10-CM | POA: Diagnosis not present

## 2021-07-08 DIAGNOSIS — R000 Tachycardia, unspecified: Secondary | ICD-10-CM | POA: Diagnosis not present

## 2021-07-08 DIAGNOSIS — K5150 Left sided colitis without complications: Secondary | ICD-10-CM | POA: Diagnosis not present

## 2021-07-08 DIAGNOSIS — G309 Alzheimer's disease, unspecified: Secondary | ICD-10-CM | POA: Diagnosis not present

## 2021-07-08 DIAGNOSIS — E0590 Thyrotoxicosis, unspecified without thyrotoxic crisis or storm: Secondary | ICD-10-CM | POA: Diagnosis not present

## 2021-07-08 DIAGNOSIS — F0280 Dementia in other diseases classified elsewhere without behavioral disturbance: Secondary | ICD-10-CM | POA: Diagnosis not present

## 2021-07-09 ENCOUNTER — Telehealth: Payer: Self-pay | Admitting: Family Medicine

## 2021-07-09 NOTE — Telephone Encounter (Signed)
Pt had UTI and wanted to know if she needs a labs done. Pt daughter wanted to know what to do because she thinks it's coming back Clinical research associate [phone]

## 2021-07-09 NOTE — Telephone Encounter (Signed)
[facility]  stated that mrs Holquin is a pt of theirs and wanted to request a speech therapy evaluation for the pt and to work on a memory care and cognition please call her back at [phone]

## 2021-07-09 NOTE — Telephone Encounter (Signed)
Appt scheduled

## 2021-07-12 ENCOUNTER — Ambulatory Visit (INDEPENDENT_AMBULATORY_CARE_PROVIDER_SITE_OTHER): Payer: Medicare Other | Admitting: Family Medicine

## 2021-07-12 ENCOUNTER — Encounter: Payer: Self-pay | Admitting: Family Medicine

## 2021-07-12 VITALS — BP 108/64 | HR 84 | Temp 98.1°F | Ht 63.0 in | Wt 113.6 lb

## 2021-07-12 DIAGNOSIS — E0590 Thyrotoxicosis, unspecified without thyrotoxic crisis or storm: Secondary | ICD-10-CM | POA: Diagnosis not present

## 2021-07-12 DIAGNOSIS — E876 Hypokalemia: Secondary | ICD-10-CM

## 2021-07-12 DIAGNOSIS — R399 Unspecified symptoms and signs involving the genitourinary system: Secondary | ICD-10-CM

## 2021-07-12 LAB — POCT URINALYSIS DIPSTICK
Bilirubin, UA: NEGATIVE
Blood, UA: NEGATIVE
Glucose, UA: NEGATIVE
Protein, UA: POSITIVE — AB
Spec Grav, UA: >=1.03 — AB (ref 1.010–1.025)
Urobilinogen, UA: 0.2 E.U./dL
pH, UA: 5.5 (ref 5.0–8.0)

## 2021-07-12 MED ORDER — SULFAMETHOXAZOLE-TRIMETHOPRIM 800-160 MG PO TABS: 1.0000 | ORAL_TABLET | Freq: Two times a day (BID) | ORAL | 0 refills | Status: AC

## 2021-07-12 NOTE — Progress Notes (Signed)
Established Patient Office Visit  Subjective   Patient ID: Cynthia Perry, female    DOB: 12/20/1945  Age: 76 y.o. MRN: [id number]  Chief Complaint  Patient presents with   Urinary Tract Infection    Concerns about possible UTI, confusion worse. Would like blood work drawn.      Urinary Tract Infection  Associated symptoms include frequency. Pertinent negatives include no chills.   presents with her daughter for evaluation of urinary frequency.  She did wander out of the house recently and went next-door to her neighbors.  Family has decided to turn on the home alarm permanently.  Review of the lab work shows the possibility of the overactive thyroid.    Review of Systems  Constitutional:  Negative for chills, diaphoresis, malaise/fatigue and weight loss.  HENT: Negative.    Eyes: Negative.  Negative for blurred vision and double vision.  Cardiovascular:  Negative for chest pain.  Gastrointestinal:  Negative for abdominal pain.  Genitourinary:  Positive for frequency.  Musculoskeletal:  Negative for falls and myalgias.  Neurological:  Negative for speech change, loss of consciousness and weakness.  Psychiatric/Behavioral: Negative.        Objective:     BP 108/64 (BP Location: Right Arm, Patient Position: Sitting, Cuff Size: Normal)   Pulse 84   Temp 98.1 F (36.7 C) (Temporal)   Ht 5' 3"  (1.6 m)   Wt 113 lb 9.6 oz (51.5 kg)   SpO2 96%   BMI 20.12 kg/m    Physical Exam Constitutional:      General: She is not in acute distress.    Appearance: Normal appearance. She is not ill-appearing, toxic-appearing or diaphoretic.  HENT:     Head: Normocephalic and atraumatic.     Right Ear: External ear normal.     Left Ear: External ear normal.     Mouth/Throat:     Mouth: Mucous membranes are moist.     Pharynx: Oropharynx is clear. No oropharyngeal exudate or posterior oropharyngeal erythema.  Eyes:     General: No scleral icterus.       Right eye: No discharge.         Left eye: No discharge.     Extraocular Movements: Extraocular movements intact.     Conjunctiva/sclera: Conjunctivae normal.     Pupils: Pupils are equal, round, and reactive to light.  Cardiovascular:     Rate and Rhythm: Normal rate and regular rhythm.  Pulmonary:     Effort: Pulmonary effort is normal. No respiratory distress.     Breath sounds: Normal breath sounds.  Abdominal:     General: Bowel sounds are normal.     Tenderness: There is no right CVA tenderness or left CVA tenderness.  Musculoskeletal:     Cervical back: No rigidity or tenderness.  Skin:    General: Skin is warm and dry.  Neurological:     Mental Status: She is alert and oriented to person, place, and time.  Psychiatric:        Mood and Affect: Mood normal.        Behavior: Behavior normal.      Results for orders placed or performed in visit on 07/12/21  POCT Urinalysis Dipstick  Result Value Ref Range   Color, UA yellow    Clarity, UA cloudy    Glucose, UA Negative Negative   Bilirubin, UA negative    Ketones, UA +    Spec Grav, UA >=1.030 (A) 1.010 - 1.025  Blood, UA negative    pH, UA 5.5 5.0 - 8.0   Protein, UA Positive (A) Negative   Urobilinogen, UA 0.2 0.2 or 1.0 E.U./dL   Nitrite, UA +    Leukocytes, UA Large (3+) (A) Negative   Appearance     Odor        The 10-year ASCVD risk score (Arnett DK, et al., 2019) is: 14.5%    Assessment & Plan:   Problem List Items Addressed This Visit       Endocrine   Hyperthyroidism   Relevant Orders   TSH   T4, free   T3, free     Other   Hypokalemia   Relevant Orders   Basic metabolic panel   Other Visit Diagnoses     UTI symptoms    -  Primary   Relevant Medications   sulfamethoxazole-trimethoprim (BACTRIM DS) 800-160 MG tablet   Other Relevant Orders   POCT Urinalysis Dipstick (Completed)   Urine Culture   Urinalysis, Routine w reflex microscopic       Return in about 3 months (around 10/12/2021), or if symptoms  worsen or fail to improve.    Libby Maw, MD

## 2021-07-12 NOTE — Telephone Encounter (Signed)
Called & provided verbal orders to Lauren at [facility] facility care for pt.

## 2021-07-13 DIAGNOSIS — K5150 Left sided colitis without complications: Secondary | ICD-10-CM | POA: Diagnosis not present

## 2021-07-13 DIAGNOSIS — G309 Alzheimer's disease, unspecified: Secondary | ICD-10-CM | POA: Diagnosis not present

## 2021-07-13 DIAGNOSIS — E43 Unspecified severe protein-calorie malnutrition: Secondary | ICD-10-CM | POA: Diagnosis not present

## 2021-07-13 DIAGNOSIS — E0590 Thyrotoxicosis, unspecified without thyrotoxic crisis or storm: Secondary | ICD-10-CM | POA: Diagnosis not present

## 2021-07-13 DIAGNOSIS — R000 Tachycardia, unspecified: Secondary | ICD-10-CM | POA: Diagnosis not present

## 2021-07-13 DIAGNOSIS — F0280 Dementia in other diseases classified elsewhere without behavioral disturbance: Secondary | ICD-10-CM | POA: Diagnosis not present

## 2021-07-20 DIAGNOSIS — F0280 Dementia in other diseases classified elsewhere without behavioral disturbance: Secondary | ICD-10-CM | POA: Diagnosis not present

## 2021-07-20 DIAGNOSIS — E0590 Thyrotoxicosis, unspecified without thyrotoxic crisis or storm: Secondary | ICD-10-CM | POA: Diagnosis not present

## 2021-07-20 DIAGNOSIS — R000 Tachycardia, unspecified: Secondary | ICD-10-CM | POA: Diagnosis not present

## 2021-07-20 DIAGNOSIS — G309 Alzheimer's disease, unspecified: Secondary | ICD-10-CM | POA: Diagnosis not present

## 2021-07-20 DIAGNOSIS — K5150 Left sided colitis without complications: Secondary | ICD-10-CM | POA: Diagnosis not present

## 2021-07-20 DIAGNOSIS — E43 Unspecified severe protein-calorie malnutrition: Secondary | ICD-10-CM | POA: Diagnosis not present

## 2021-07-21 ENCOUNTER — Telehealth: Payer: Self-pay | Admitting: Family Medicine

## 2021-07-21 DIAGNOSIS — F0280 Dementia in other diseases classified elsewhere without behavioral disturbance: Secondary | ICD-10-CM | POA: Diagnosis not present

## 2021-07-21 DIAGNOSIS — G309 Alzheimer's disease, unspecified: Secondary | ICD-10-CM | POA: Diagnosis not present

## 2021-07-21 DIAGNOSIS — E43 Unspecified severe protein-calorie malnutrition: Secondary | ICD-10-CM | POA: Diagnosis not present

## 2021-07-21 DIAGNOSIS — K5150 Left sided colitis without complications: Secondary | ICD-10-CM | POA: Diagnosis not present

## 2021-07-21 DIAGNOSIS — E0590 Thyrotoxicosis, unspecified without thyrotoxic crisis or storm: Secondary | ICD-10-CM | POA: Diagnosis not present

## 2021-07-21 DIAGNOSIS — R000 Tachycardia, unspecified: Secondary | ICD-10-CM | POA: Diagnosis not present

## 2021-07-21 NOTE — Telephone Encounter (Signed)
Will from [facility] Request verbal orders [phone] requestion  OTextention by2 times a week per week

## 2021-07-22 NOTE — Telephone Encounter (Signed)
Verbal orders given  

## 2021-07-26 DIAGNOSIS — F0280 Dementia in other diseases classified elsewhere without behavioral disturbance: Secondary | ICD-10-CM | POA: Diagnosis not present

## 2021-07-26 DIAGNOSIS — R000 Tachycardia, unspecified: Secondary | ICD-10-CM | POA: Diagnosis not present

## 2021-07-26 DIAGNOSIS — K5150 Left sided colitis without complications: Secondary | ICD-10-CM | POA: Diagnosis not present

## 2021-07-26 DIAGNOSIS — E43 Unspecified severe protein-calorie malnutrition: Secondary | ICD-10-CM | POA: Diagnosis not present

## 2021-07-26 DIAGNOSIS — G309 Alzheimer's disease, unspecified: Secondary | ICD-10-CM | POA: Diagnosis not present

## 2021-07-26 DIAGNOSIS — E0590 Thyrotoxicosis, unspecified without thyrotoxic crisis or storm: Secondary | ICD-10-CM | POA: Diagnosis not present

## 2021-07-30 DIAGNOSIS — E0590 Thyrotoxicosis, unspecified without thyrotoxic crisis or storm: Secondary | ICD-10-CM | POA: Diagnosis not present

## 2021-07-30 DIAGNOSIS — G309 Alzheimer's disease, unspecified: Secondary | ICD-10-CM | POA: Diagnosis not present

## 2021-07-30 DIAGNOSIS — E43 Unspecified severe protein-calorie malnutrition: Secondary | ICD-10-CM | POA: Diagnosis not present

## 2021-07-30 DIAGNOSIS — K5150 Left sided colitis without complications: Secondary | ICD-10-CM | POA: Diagnosis not present

## 2021-07-30 DIAGNOSIS — R000 Tachycardia, unspecified: Secondary | ICD-10-CM | POA: Diagnosis not present

## 2021-07-30 DIAGNOSIS — F0280 Dementia in other diseases classified elsewhere without behavioral disturbance: Secondary | ICD-10-CM | POA: Diagnosis not present

## 2021-08-02 DIAGNOSIS — E43 Unspecified severe protein-calorie malnutrition: Secondary | ICD-10-CM | POA: Diagnosis not present

## 2021-08-02 DIAGNOSIS — F0280 Dementia in other diseases classified elsewhere without behavioral disturbance: Secondary | ICD-10-CM | POA: Diagnosis not present

## 2021-08-02 DIAGNOSIS — G309 Alzheimer's disease, unspecified: Secondary | ICD-10-CM | POA: Diagnosis not present

## 2021-08-02 DIAGNOSIS — R000 Tachycardia, unspecified: Secondary | ICD-10-CM | POA: Diagnosis not present

## 2021-08-02 DIAGNOSIS — E0590 Thyrotoxicosis, unspecified without thyrotoxic crisis or storm: Secondary | ICD-10-CM | POA: Diagnosis not present

## 2021-08-02 DIAGNOSIS — K5150 Left sided colitis without complications: Secondary | ICD-10-CM | POA: Diagnosis not present

## 2021-08-03 DIAGNOSIS — R41841 Cognitive communication deficit: Secondary | ICD-10-CM | POA: Diagnosis not present

## 2021-08-03 DIAGNOSIS — R5381 Other malaise: Secondary | ICD-10-CM | POA: Diagnosis not present

## 2021-08-03 DIAGNOSIS — G309 Alzheimer's disease, unspecified: Secondary | ICD-10-CM | POA: Diagnosis not present

## 2021-08-03 DIAGNOSIS — R634 Abnormal weight loss: Secondary | ICD-10-CM | POA: Diagnosis not present

## 2021-08-03 DIAGNOSIS — E43 Unspecified severe protein-calorie malnutrition: Secondary | ICD-10-CM | POA: Diagnosis not present

## 2021-08-03 DIAGNOSIS — E538 Deficiency of other specified B group vitamins: Secondary | ICD-10-CM | POA: Diagnosis not present

## 2021-08-03 DIAGNOSIS — E0590 Thyrotoxicosis, unspecified without thyrotoxic crisis or storm: Secondary | ICD-10-CM | POA: Diagnosis not present

## 2021-08-03 DIAGNOSIS — F0280 Dementia in other diseases classified elsewhere without behavioral disturbance: Secondary | ICD-10-CM | POA: Diagnosis not present

## 2021-08-03 DIAGNOSIS — Z8679 Personal history of other diseases of the circulatory system: Secondary | ICD-10-CM | POA: Diagnosis not present

## 2021-08-03 DIAGNOSIS — F411 Generalized anxiety disorder: Secondary | ICD-10-CM | POA: Diagnosis not present

## 2021-08-03 DIAGNOSIS — R000 Tachycardia, unspecified: Secondary | ICD-10-CM | POA: Diagnosis not present

## 2021-08-03 DIAGNOSIS — R1902 Left upper quadrant abdominal swelling, mass and lump: Secondary | ICD-10-CM | POA: Diagnosis not present

## 2021-08-12 DIAGNOSIS — R41841 Cognitive communication deficit: Secondary | ICD-10-CM | POA: Diagnosis not present

## 2021-08-12 DIAGNOSIS — E0590 Thyrotoxicosis, unspecified without thyrotoxic crisis or storm: Secondary | ICD-10-CM | POA: Diagnosis not present

## 2021-08-12 DIAGNOSIS — G309 Alzheimer's disease, unspecified: Secondary | ICD-10-CM | POA: Diagnosis not present

## 2021-08-12 DIAGNOSIS — F0280 Dementia in other diseases classified elsewhere without behavioral disturbance: Secondary | ICD-10-CM | POA: Diagnosis not present

## 2021-08-12 DIAGNOSIS — E43 Unspecified severe protein-calorie malnutrition: Secondary | ICD-10-CM | POA: Diagnosis not present

## 2021-08-12 DIAGNOSIS — R000 Tachycardia, unspecified: Secondary | ICD-10-CM | POA: Diagnosis not present

## 2021-08-17 ENCOUNTER — Telehealth: Payer: Self-pay | Admitting: Family Medicine

## 2021-08-17 DIAGNOSIS — R000 Tachycardia, unspecified: Secondary | ICD-10-CM | POA: Diagnosis not present

## 2021-08-17 DIAGNOSIS — F0280 Dementia in other diseases classified elsewhere without behavioral disturbance: Secondary | ICD-10-CM | POA: Diagnosis not present

## 2021-08-17 DIAGNOSIS — R41841 Cognitive communication deficit: Secondary | ICD-10-CM | POA: Diagnosis not present

## 2021-08-17 DIAGNOSIS — G309 Alzheimer's disease, unspecified: Secondary | ICD-10-CM | POA: Diagnosis not present

## 2021-08-17 DIAGNOSIS — E43 Unspecified severe protein-calorie malnutrition: Secondary | ICD-10-CM | POA: Diagnosis not present

## 2021-08-17 DIAGNOSIS — E0590 Thyrotoxicosis, unspecified without thyrotoxic crisis or storm: Secondary | ICD-10-CM | POA: Diagnosis not present

## 2021-08-17 NOTE — Telephone Encounter (Signed)
Katlyn from [facility] has faxed some orders over that need to be signed and faxed back and there outstanding

## 2021-08-18 NOTE — Telephone Encounter (Signed)
Caller Name: [facility]  Call back phone #: [phone] Fax# [phone]  Reason for Call: Pt has open hemorid and as her dementia is worsening she is getting confused and has been putting hand sanitizer instead of cream on open hemorid. This is causing a bad reaction to her legs. They are asking for an order to be sent in by Dr. Ethelene Hal to approve nursing be sent to help her.

## 2021-08-19 DIAGNOSIS — R000 Tachycardia, unspecified: Secondary | ICD-10-CM | POA: Diagnosis not present

## 2021-08-19 DIAGNOSIS — G309 Alzheimer's disease, unspecified: Secondary | ICD-10-CM | POA: Diagnosis not present

## 2021-08-19 DIAGNOSIS — R41841 Cognitive communication deficit: Secondary | ICD-10-CM | POA: Diagnosis not present

## 2021-08-19 DIAGNOSIS — E43 Unspecified severe protein-calorie malnutrition: Secondary | ICD-10-CM | POA: Diagnosis not present

## 2021-08-19 DIAGNOSIS — F0280 Dementia in other diseases classified elsewhere without behavioral disturbance: Secondary | ICD-10-CM | POA: Diagnosis not present

## 2021-08-19 DIAGNOSIS — E0590 Thyrotoxicosis, unspecified without thyrotoxic crisis or storm: Secondary | ICD-10-CM | POA: Diagnosis not present

## 2021-08-24 ENCOUNTER — Telehealth: Payer: Self-pay | Admitting: Family Medicine

## 2021-08-24 DIAGNOSIS — R000 Tachycardia, unspecified: Secondary | ICD-10-CM | POA: Diagnosis not present

## 2021-08-24 DIAGNOSIS — G309 Alzheimer's disease, unspecified: Secondary | ICD-10-CM | POA: Diagnosis not present

## 2021-08-24 DIAGNOSIS — E0590 Thyrotoxicosis, unspecified without thyrotoxic crisis or storm: Secondary | ICD-10-CM | POA: Diagnosis not present

## 2021-08-24 DIAGNOSIS — R41841 Cognitive communication deficit: Secondary | ICD-10-CM | POA: Diagnosis not present

## 2021-08-24 DIAGNOSIS — F0280 Dementia in other diseases classified elsewhere without behavioral disturbance: Secondary | ICD-10-CM | POA: Diagnosis not present

## 2021-08-24 DIAGNOSIS — E43 Unspecified severe protein-calorie malnutrition: Secondary | ICD-10-CM | POA: Diagnosis not present

## 2021-08-24 NOTE — Telephone Encounter (Signed)
Caller Name: [facility] Danae Chen) Call back phone #: [phone]  Reason for Call: High heart rate 108-110. Feeling of fatigue and body aches. Has had increased confusion

## 2021-08-24 NOTE — Telephone Encounter (Signed)
Spoke with Cynthia Perry at [facility] who states that she feels patients dementia is becoming worse patient complaining of body aches and fatigue. Tried calling both contacts left message on daughter [name]) phone to call back.

## 2021-08-25 NOTE — Telephone Encounter (Signed)
Orders signed and faxed.

## 2021-08-27 DIAGNOSIS — R41841 Cognitive communication deficit: Secondary | ICD-10-CM | POA: Diagnosis not present

## 2021-08-27 DIAGNOSIS — R000 Tachycardia, unspecified: Secondary | ICD-10-CM | POA: Diagnosis not present

## 2021-08-27 DIAGNOSIS — E43 Unspecified severe protein-calorie malnutrition: Secondary | ICD-10-CM | POA: Diagnosis not present

## 2021-08-27 DIAGNOSIS — F0280 Dementia in other diseases classified elsewhere without behavioral disturbance: Secondary | ICD-10-CM | POA: Diagnosis not present

## 2021-08-27 DIAGNOSIS — G309 Alzheimer's disease, unspecified: Secondary | ICD-10-CM | POA: Diagnosis not present

## 2021-08-27 DIAGNOSIS — E0590 Thyrotoxicosis, unspecified without thyrotoxic crisis or storm: Secondary | ICD-10-CM | POA: Diagnosis not present

## 2021-08-27 NOTE — Telephone Encounter (Signed)
Have called several times trying to reach patients family to check on patient no answer on either of her daughters phone LM on Karen's to give [place] a call.

## 2021-09-02 DIAGNOSIS — K5289 Other specified noninfective gastroenteritis and colitis: Secondary | ICD-10-CM | POA: Diagnosis not present

## 2021-09-02 DIAGNOSIS — K644 Residual hemorrhoidal skin tags: Secondary | ICD-10-CM | POA: Diagnosis not present

## 2021-09-07 ENCOUNTER — Telehealth: Payer: Self-pay | Admitting: Family Medicine

## 2021-09-07 NOTE — Telephone Encounter (Signed)
Spoke with patients daughter scheduled appointment for OV and evaluation.

## 2021-09-07 NOTE — Telephone Encounter (Signed)
Cynthia Perry (daughter ) [phone] stated pt might have a infection . And pt will not give a urine example please call daughter

## 2021-09-08 ENCOUNTER — Encounter: Payer: Self-pay | Admitting: Family Medicine

## 2021-09-08 ENCOUNTER — Ambulatory Visit (INDEPENDENT_AMBULATORY_CARE_PROVIDER_SITE_OTHER): Payer: Medicare Other | Admitting: Family Medicine

## 2021-09-08 VITALS — BP 102/64 | HR 103 | Temp 97.1°F | Ht 63.0 in | Wt 109.4 lb

## 2021-09-08 DIAGNOSIS — R399 Unspecified symptoms and signs involving the genitourinary system: Secondary | ICD-10-CM | POA: Insufficient documentation

## 2021-09-08 DIAGNOSIS — F03B18 Unspecified dementia, moderate, with other behavioral disturbance: Secondary | ICD-10-CM | POA: Diagnosis not present

## 2021-09-08 DIAGNOSIS — K6289 Other specified diseases of anus and rectum: Secondary | ICD-10-CM | POA: Diagnosis not present

## 2021-09-08 DIAGNOSIS — E0590 Thyrotoxicosis, unspecified without thyrotoxic crisis or storm: Secondary | ICD-10-CM

## 2021-09-08 DIAGNOSIS — F03B0 Unspecified dementia, moderate, without behavioral disturbance, psychotic disturbance, mood disturbance, and anxiety: Secondary | ICD-10-CM | POA: Insufficient documentation

## 2021-09-08 LAB — T4, FREE: Free T4: 1.28 ng/dL (ref 0.60–1.60)

## 2021-09-08 LAB — TSH: TSH: 0.86 u[IU]/mL (ref 0.35–5.50)

## 2021-09-08 MED ORDER — GERHARDT'S BUTT CREAM: 1.0000 | TOPICAL_CREAM | Freq: Two times a day (BID) | CUTANEOUS | 0 refills | Status: DC

## 2021-09-08 NOTE — Progress Notes (Addendum)
Established Patient Office Visit  Subjective   Patient ID: Cynthia Perry, female    DOB: 04/25/1945  Age: 76 y.o. MRN: [id number]  Chief Complaint  Patient presents with   Urinary Incontinence    Possible UTI, incontinence, not eating or drinking, seems very confused.     HPI for follow-up.  Remains at home with her invalid husband.  Children are taking turns helping her and and their father.  Under work-up for colitis.  Pathology report is pending.  Decreased appetite.  Family is concerned about a UTI.  Patient has not complained of burning with urination.  There has not been any frequency.  Family is concerned that patient has not been consistently taking the mesalamine.  Quetiapine seems to be helping with sleep.    Review of Systems  Constitutional: Negative.   HENT: Negative.    Eyes:  Negative for blurred vision, discharge and redness.  Respiratory: Negative.    Cardiovascular: Negative.   Gastrointestinal:  Negative for abdominal pain.  Genitourinary: Negative.  Negative for dysuria and frequency.  Musculoskeletal: Negative.  Negative for myalgias.  Skin:  Negative for rash.  Neurological:  Negative for tingling, loss of consciousness and weakness.  Endo/Heme/Allergies:  Negative for polydipsia.  Psychiatric/Behavioral:  Positive for memory loss.       Objective:     BP 102/64 (BP Location: Left Arm, Patient Position: Sitting, Cuff Size: Normal)   Pulse (!) 103   Temp (!) 97.1 F (36.2 C) (Temporal)   Ht 5' 3"  (1.6 m)   Wt 109 lb 6.4 oz (49.6 kg)   SpO2 97%   BMI 19.38 kg/m  Wt Readings from Last 3 Encounters:  09/08/21 109 lb 6.4 oz (49.6 kg)  07/12/21 113 lb 9.6 oz (51.5 kg)  05/29/21 97 lb (44 kg)      Physical Exam Constitutional:      General: She is not in acute distress.    Appearance: Normal appearance. She is not ill-appearing, toxic-appearing or diaphoretic.  HENT:     Head: Normocephalic and atraumatic.     Right Ear: External ear normal.      Left Ear: External ear normal.  Eyes:     General: No scleral icterus.       Right eye: No discharge.        Left eye: No discharge.     Extraocular Movements: Extraocular movements intact.     Conjunctiva/sclera: Conjunctivae normal.  Cardiovascular:     Rate and Rhythm: Normal rate and regular rhythm.  Pulmonary:     Effort: Pulmonary effort is normal. No respiratory distress.     Breath sounds: Normal breath sounds.  Abdominal:     General: Bowel sounds are normal. There is no distension.     Tenderness: There is no abdominal tenderness. There is no guarding.  Musculoskeletal:     Cervical back: No rigidity or tenderness.  Skin:    General: Skin is warm and dry.  Neurological:     Mental Status: She is alert and oriented to person, place, and time.  Psychiatric:        Mood and Affect: Mood normal.        Behavior: Behavior normal.      Results for orders placed or performed in visit on 09/08/21  Urine Culture   Specimen: Urine  Result Value Ref Range   MICRO NUMBER: [id number]    SPECIMEN QUALITY: Adequate    Sample Source URINE    STATUS: FINAL  ISOLATE 1: Klebsiella pneumoniae (A)       Susceptibility   Klebsiella pneumoniae - URINE CULTURE, REFLEX    AMOX/CLAVULANIC 8 Sensitive     AMPICILLIN >=32 Resistant     AMPICILLIN/SULBACTAM 16 Intermediate     CEFAZOLIN* <=4 Not Reportable      * For infections other than uncomplicated UTI caused by E. coli, K. pneumoniae or P. mirabilis: Cefazolin is resistant if MIC > or = 8 mcg/mL. (Distinguishing susceptible versus intermediate for isolates with MIC < or = 4 mcg/mL requires additional testing.) For uncomplicated UTI caused by E. coli, K. pneumoniae or P. mirabilis: Cefazolin is susceptible if MIC <32 mcg/mL and predicts susceptible to the oral agents cefaclor, cefdinir, cefpodoxime, cefprozil, cefuroxime, cephalexin and loracarbef.     CEFTAZIDIME <=1 Sensitive     CEFEPIME <=1 Sensitive     CEFTRIAXONE  <=1 Sensitive     CIPROFLOXACIN <=0.25 Sensitive     LEVOFLOXACIN 0.5 Sensitive     GENTAMICIN <=1 Sensitive     IMIPENEM <=0.25 Sensitive     NITROFURANTOIN 64 Intermediate     PIP/TAZO 16 Sensitive     TOBRAMYCIN <=1 Sensitive     TRIMETH/SULFA* <=20 Sensitive      * For infections other than uncomplicated UTI caused by E. coli, K. pneumoniae or P. mirabilis: Cefazolin is resistant if MIC > or = 8 mcg/mL. (Distinguishing susceptible versus intermediate for isolates with MIC < or = 4 mcg/mL requires additional testing.) For uncomplicated UTI caused by E. coli, K. pneumoniae or P. mirabilis: Cefazolin is susceptible if MIC <32 mcg/mL and predicts susceptible to the oral agents cefaclor, cefdinir, cefpodoxime, cefprozil, cefuroxime, cephalexin and loracarbef. Legend: S = Susceptible  I = Intermediate R = Resistant  NS = Not susceptible * = Not tested  NR = Not reported **NN = See antimicrobic comments   MICROSCOPIC MESSAGE  Result Value Ref Range   Note    TSH  Result Value Ref Range   TSH 0.86 0.35 - 5.50 uIU/mL  T4, free  Result Value Ref Range   Free T4 1.28 0.60 - 1.60 ng/dL  Urinalysis, Routine w reflex microscopic  Result Value Ref Range   Color, Urine YELLOW YELLOW   APPearance TURBID (A) CLEAR   Specific Gravity, Urine 1.014 1.001 - 1.035   pH 7.5 5.0 - 8.0   Glucose, UA NEGATIVE NEGATIVE   Bilirubin Urine NEGATIVE NEGATIVE   Ketones, ur NEGATIVE NEGATIVE   Hgb urine dipstick TRACE (A) NEGATIVE   Protein, ur 2+ (A) NEGATIVE   Nitrite POSITIVE (A) NEGATIVE   Leukocytes,Ua 3+ (A) NEGATIVE      The 10-year ASCVD risk score (Arnett DK, et al., 2019) is: 13.1%    Assessment & Plan:   Problem List Items Addressed This Visit       Endocrine   Hyperthyroidism - Primary   Relevant Orders   TSH (Completed)   T4, free (Completed)     Nervous and Auditory   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    Libby Maw, MD

## 2021-09-10 ENCOUNTER — Other Ambulatory Visit: Payer: Medicare Other

## 2021-09-10 DIAGNOSIS — R399 Unspecified symptoms and signs involving the genitourinary system: Secondary | ICD-10-CM | POA: Diagnosis not present

## 2021-09-10 NOTE — Addendum Note (Signed)
Addended by: Beryle Lathe S on: 09/10/2021 02:40 PM   Modules accepted: Orders

## 2021-09-12 DIAGNOSIS — K6289 Other specified diseases of anus and rectum: Secondary | ICD-10-CM | POA: Diagnosis not present

## 2021-09-12 DIAGNOSIS — F03B0 Unspecified dementia, moderate, without behavioral disturbance, psychotic disturbance, mood disturbance, and anxiety: Secondary | ICD-10-CM | POA: Diagnosis not present

## 2021-09-12 DIAGNOSIS — M6281 Muscle weakness (generalized): Secondary | ICD-10-CM | POA: Diagnosis not present

## 2021-09-12 DIAGNOSIS — R32 Unspecified urinary incontinence: Secondary | ICD-10-CM | POA: Diagnosis not present

## 2021-09-12 DIAGNOSIS — E039 Hypothyroidism, unspecified: Secondary | ICD-10-CM | POA: Diagnosis not present

## 2021-09-12 DIAGNOSIS — Z9181 History of falling: Secondary | ICD-10-CM | POA: Diagnosis not present

## 2021-09-13 ENCOUNTER — Telehealth: Payer: Self-pay | Admitting: Family Medicine

## 2021-09-13 LAB — URINALYSIS, ROUTINE W REFLEX MICROSCOPIC
Bilirubin Urine: NEGATIVE
Glucose, UA: NEGATIVE
Ketones, ur: NEGATIVE
Nitrite: POSITIVE — AB
Specific Gravity, Urine: 1.014 (ref 1.001–1.035)
pH: 7.5 (ref 5.0–8.0)

## 2021-09-13 LAB — URINE CULTURE
MICRO NUMBER:: 13891688
SPECIMEN QUALITY:: ADEQUATE

## 2021-09-13 LAB — MICROSCOPIC MESSAGE

## 2021-09-13 MED ORDER — SULFAMETHOXAZOLE-TRIMETHOPRIM 800-160 MG PO TABS: 1.0000 | ORAL_TABLET | Freq: Two times a day (BID) | ORAL | 0 refills | Status: AC

## 2021-09-13 NOTE — Addendum Note (Signed)
Addended by: Jon Billings on: 09/13/2021 04:42 PM   Modules accepted: Orders

## 2021-09-13 NOTE — Telephone Encounter (Signed)
Caller Name: Lucianne Lei Call back phone #: [phone]  Reason for Call: Pt has tested positive for a UTI, please call pt's daughter with how to move forward.

## 2021-09-14 NOTE — Telephone Encounter (Signed)
Family aware of labs and will start pt on Rx today

## 2021-09-15 DIAGNOSIS — Z9181 History of falling: Secondary | ICD-10-CM | POA: Diagnosis not present

## 2021-09-15 DIAGNOSIS — E039 Hypothyroidism, unspecified: Secondary | ICD-10-CM | POA: Diagnosis not present

## 2021-09-15 DIAGNOSIS — F03B0 Unspecified dementia, moderate, without behavioral disturbance, psychotic disturbance, mood disturbance, and anxiety: Secondary | ICD-10-CM | POA: Diagnosis not present

## 2021-09-15 DIAGNOSIS — K6289 Other specified diseases of anus and rectum: Secondary | ICD-10-CM | POA: Diagnosis not present

## 2021-09-15 DIAGNOSIS — M6281 Muscle weakness (generalized): Secondary | ICD-10-CM | POA: Diagnosis not present

## 2021-09-15 DIAGNOSIS — R32 Unspecified urinary incontinence: Secondary | ICD-10-CM | POA: Diagnosis not present

## 2021-09-16 ENCOUNTER — Telehealth: Payer: Self-pay | Admitting: Family Medicine

## 2021-09-16 DIAGNOSIS — Z9181 History of falling: Secondary | ICD-10-CM | POA: Diagnosis not present

## 2021-09-16 DIAGNOSIS — K6289 Other specified diseases of anus and rectum: Secondary | ICD-10-CM | POA: Diagnosis not present

## 2021-09-16 DIAGNOSIS — F03B0 Unspecified dementia, moderate, without behavioral disturbance, psychotic disturbance, mood disturbance, and anxiety: Secondary | ICD-10-CM | POA: Diagnosis not present

## 2021-09-16 DIAGNOSIS — M6281 Muscle weakness (generalized): Secondary | ICD-10-CM | POA: Diagnosis not present

## 2021-09-16 DIAGNOSIS — E039 Hypothyroidism, unspecified: Secondary | ICD-10-CM | POA: Diagnosis not present

## 2021-09-16 DIAGNOSIS — R32 Unspecified urinary incontinence: Secondary | ICD-10-CM | POA: Diagnosis not present

## 2021-09-16 NOTE — Telephone Encounter (Signed)
[facility] Claib[facility] Billings [phone] Cla[facility] Billings is requesting a verbal order for home PT 1xw for 9 weeks

## 2021-09-17 NOTE — Telephone Encounter (Signed)
Verbal orders given  

## 2021-09-20 DIAGNOSIS — K6289 Other specified diseases of anus and rectum: Secondary | ICD-10-CM | POA: Diagnosis not present

## 2021-09-20 DIAGNOSIS — Z9181 History of falling: Secondary | ICD-10-CM | POA: Diagnosis not present

## 2021-09-20 DIAGNOSIS — E039 Hypothyroidism, unspecified: Secondary | ICD-10-CM | POA: Diagnosis not present

## 2021-09-20 DIAGNOSIS — M6281 Muscle weakness (generalized): Secondary | ICD-10-CM | POA: Diagnosis not present

## 2021-09-20 DIAGNOSIS — F03B0 Unspecified dementia, moderate, without behavioral disturbance, psychotic disturbance, mood disturbance, and anxiety: Secondary | ICD-10-CM | POA: Diagnosis not present

## 2021-09-20 DIAGNOSIS — R32 Unspecified urinary incontinence: Secondary | ICD-10-CM | POA: Diagnosis not present

## 2021-09-23 DIAGNOSIS — M6281 Muscle weakness (generalized): Secondary | ICD-10-CM | POA: Diagnosis not present

## 2021-09-23 DIAGNOSIS — K6289 Other specified diseases of anus and rectum: Secondary | ICD-10-CM | POA: Diagnosis not present

## 2021-09-23 DIAGNOSIS — F03B0 Unspecified dementia, moderate, without behavioral disturbance, psychotic disturbance, mood disturbance, and anxiety: Secondary | ICD-10-CM | POA: Diagnosis not present

## 2021-09-23 DIAGNOSIS — Z9181 History of falling: Secondary | ICD-10-CM | POA: Diagnosis not present

## 2021-09-23 DIAGNOSIS — R32 Unspecified urinary incontinence: Secondary | ICD-10-CM | POA: Diagnosis not present

## 2021-09-23 DIAGNOSIS — E039 Hypothyroidism, unspecified: Secondary | ICD-10-CM | POA: Diagnosis not present

## 2021-09-27 DIAGNOSIS — F03B0 Unspecified dementia, moderate, without behavioral disturbance, psychotic disturbance, mood disturbance, and anxiety: Secondary | ICD-10-CM | POA: Diagnosis not present

## 2021-09-27 DIAGNOSIS — K6289 Other specified diseases of anus and rectum: Secondary | ICD-10-CM | POA: Diagnosis not present

## 2021-09-27 DIAGNOSIS — E039 Hypothyroidism, unspecified: Secondary | ICD-10-CM | POA: Diagnosis not present

## 2021-09-27 DIAGNOSIS — M6281 Muscle weakness (generalized): Secondary | ICD-10-CM | POA: Diagnosis not present

## 2021-09-27 DIAGNOSIS — R32 Unspecified urinary incontinence: Secondary | ICD-10-CM | POA: Diagnosis not present

## 2021-09-27 DIAGNOSIS — Z9181 History of falling: Secondary | ICD-10-CM | POA: Diagnosis not present

## 2021-09-29 DIAGNOSIS — F03B0 Unspecified dementia, moderate, without behavioral disturbance, psychotic disturbance, mood disturbance, and anxiety: Secondary | ICD-10-CM | POA: Diagnosis not present

## 2021-09-29 DIAGNOSIS — Z9181 History of falling: Secondary | ICD-10-CM | POA: Diagnosis not present

## 2021-09-29 DIAGNOSIS — R32 Unspecified urinary incontinence: Secondary | ICD-10-CM | POA: Diagnosis not present

## 2021-09-29 DIAGNOSIS — K6289 Other specified diseases of anus and rectum: Secondary | ICD-10-CM | POA: Diagnosis not present

## 2021-09-29 DIAGNOSIS — M6281 Muscle weakness (generalized): Secondary | ICD-10-CM | POA: Diagnosis not present

## 2021-09-29 DIAGNOSIS — E039 Hypothyroidism, unspecified: Secondary | ICD-10-CM | POA: Diagnosis not present

## 2021-10-04 DIAGNOSIS — M6281 Muscle weakness (generalized): Secondary | ICD-10-CM | POA: Diagnosis not present

## 2021-10-04 DIAGNOSIS — E039 Hypothyroidism, unspecified: Secondary | ICD-10-CM | POA: Diagnosis not present

## 2021-10-04 DIAGNOSIS — Z9181 History of falling: Secondary | ICD-10-CM | POA: Diagnosis not present

## 2021-10-04 DIAGNOSIS — K6289 Other specified diseases of anus and rectum: Secondary | ICD-10-CM | POA: Diagnosis not present

## 2021-10-04 DIAGNOSIS — F03B0 Unspecified dementia, moderate, without behavioral disturbance, psychotic disturbance, mood disturbance, and anxiety: Secondary | ICD-10-CM | POA: Diagnosis not present

## 2021-10-04 DIAGNOSIS — R32 Unspecified urinary incontinence: Secondary | ICD-10-CM | POA: Diagnosis not present

## 2021-10-05 DIAGNOSIS — E039 Hypothyroidism, unspecified: Secondary | ICD-10-CM | POA: Diagnosis not present

## 2021-10-05 DIAGNOSIS — M6281 Muscle weakness (generalized): Secondary | ICD-10-CM | POA: Diagnosis not present

## 2021-10-05 DIAGNOSIS — Z9181 History of falling: Secondary | ICD-10-CM | POA: Diagnosis not present

## 2021-10-05 DIAGNOSIS — R32 Unspecified urinary incontinence: Secondary | ICD-10-CM | POA: Diagnosis not present

## 2021-10-05 DIAGNOSIS — F03B0 Unspecified dementia, moderate, without behavioral disturbance, psychotic disturbance, mood disturbance, and anxiety: Secondary | ICD-10-CM | POA: Diagnosis not present

## 2021-10-05 DIAGNOSIS — K6289 Other specified diseases of anus and rectum: Secondary | ICD-10-CM | POA: Diagnosis not present

## 2021-10-06 DIAGNOSIS — K6289 Other specified diseases of anus and rectum: Secondary | ICD-10-CM | POA: Diagnosis not present

## 2021-10-06 DIAGNOSIS — F03B0 Unspecified dementia, moderate, without behavioral disturbance, psychotic disturbance, mood disturbance, and anxiety: Secondary | ICD-10-CM | POA: Diagnosis not present

## 2021-10-06 DIAGNOSIS — R32 Unspecified urinary incontinence: Secondary | ICD-10-CM | POA: Diagnosis not present

## 2021-10-06 DIAGNOSIS — Z9181 History of falling: Secondary | ICD-10-CM | POA: Diagnosis not present

## 2021-10-06 DIAGNOSIS — M6281 Muscle weakness (generalized): Secondary | ICD-10-CM | POA: Diagnosis not present

## 2021-10-06 DIAGNOSIS — E039 Hypothyroidism, unspecified: Secondary | ICD-10-CM | POA: Diagnosis not present

## 2021-10-12 ENCOUNTER — Ambulatory Visit: Payer: Medicare Other | Admitting: Family Medicine

## 2021-10-12 DIAGNOSIS — R32 Unspecified urinary incontinence: Secondary | ICD-10-CM | POA: Diagnosis not present

## 2021-10-12 DIAGNOSIS — K6289 Other specified diseases of anus and rectum: Secondary | ICD-10-CM | POA: Diagnosis not present

## 2021-10-12 DIAGNOSIS — M6281 Muscle weakness (generalized): Secondary | ICD-10-CM | POA: Diagnosis not present

## 2021-10-12 DIAGNOSIS — E039 Hypothyroidism, unspecified: Secondary | ICD-10-CM | POA: Diagnosis not present

## 2021-10-12 DIAGNOSIS — Z9181 History of falling: Secondary | ICD-10-CM | POA: Diagnosis not present

## 2021-10-12 DIAGNOSIS — F03B0 Unspecified dementia, moderate, without behavioral disturbance, psychotic disturbance, mood disturbance, and anxiety: Secondary | ICD-10-CM | POA: Diagnosis not present

## 2021-10-18 DIAGNOSIS — K6289 Other specified diseases of anus and rectum: Secondary | ICD-10-CM | POA: Diagnosis not present

## 2021-10-18 DIAGNOSIS — F03B0 Unspecified dementia, moderate, without behavioral disturbance, psychotic disturbance, mood disturbance, and anxiety: Secondary | ICD-10-CM | POA: Diagnosis not present

## 2021-10-18 DIAGNOSIS — Z9181 History of falling: Secondary | ICD-10-CM | POA: Diagnosis not present

## 2021-10-18 DIAGNOSIS — M6281 Muscle weakness (generalized): Secondary | ICD-10-CM | POA: Diagnosis not present

## 2021-10-18 DIAGNOSIS — R32 Unspecified urinary incontinence: Secondary | ICD-10-CM | POA: Diagnosis not present

## 2021-10-18 DIAGNOSIS — E039 Hypothyroidism, unspecified: Secondary | ICD-10-CM | POA: Diagnosis not present

## 2021-10-20 DIAGNOSIS — E039 Hypothyroidism, unspecified: Secondary | ICD-10-CM | POA: Diagnosis not present

## 2021-10-20 DIAGNOSIS — R32 Unspecified urinary incontinence: Secondary | ICD-10-CM | POA: Diagnosis not present

## 2021-10-20 DIAGNOSIS — K6289 Other specified diseases of anus and rectum: Secondary | ICD-10-CM | POA: Diagnosis not present

## 2021-10-20 DIAGNOSIS — Z9181 History of falling: Secondary | ICD-10-CM | POA: Diagnosis not present

## 2021-10-20 DIAGNOSIS — M6281 Muscle weakness (generalized): Secondary | ICD-10-CM | POA: Diagnosis not present

## 2021-10-20 DIAGNOSIS — F03B0 Unspecified dementia, moderate, without behavioral disturbance, psychotic disturbance, mood disturbance, and anxiety: Secondary | ICD-10-CM | POA: Diagnosis not present

## 2021-10-25 DIAGNOSIS — M6281 Muscle weakness (generalized): Secondary | ICD-10-CM | POA: Diagnosis not present

## 2021-10-25 DIAGNOSIS — Z9181 History of falling: Secondary | ICD-10-CM | POA: Diagnosis not present

## 2021-10-25 DIAGNOSIS — R32 Unspecified urinary incontinence: Secondary | ICD-10-CM | POA: Diagnosis not present

## 2021-10-25 DIAGNOSIS — E039 Hypothyroidism, unspecified: Secondary | ICD-10-CM | POA: Diagnosis not present

## 2021-10-25 DIAGNOSIS — F03B0 Unspecified dementia, moderate, without behavioral disturbance, psychotic disturbance, mood disturbance, and anxiety: Secondary | ICD-10-CM | POA: Diagnosis not present

## 2021-10-25 DIAGNOSIS — K6289 Other specified diseases of anus and rectum: Secondary | ICD-10-CM | POA: Diagnosis not present

## 2021-10-26 DIAGNOSIS — M6281 Muscle weakness (generalized): Secondary | ICD-10-CM | POA: Diagnosis not present

## 2021-10-26 DIAGNOSIS — E039 Hypothyroidism, unspecified: Secondary | ICD-10-CM | POA: Diagnosis not present

## 2021-10-26 DIAGNOSIS — K6289 Other specified diseases of anus and rectum: Secondary | ICD-10-CM | POA: Diagnosis not present

## 2021-10-26 DIAGNOSIS — Z9181 History of falling: Secondary | ICD-10-CM | POA: Diagnosis not present

## 2021-10-26 DIAGNOSIS — R32 Unspecified urinary incontinence: Secondary | ICD-10-CM | POA: Diagnosis not present

## 2021-10-26 DIAGNOSIS — F03B0 Unspecified dementia, moderate, without behavioral disturbance, psychotic disturbance, mood disturbance, and anxiety: Secondary | ICD-10-CM | POA: Diagnosis not present

## 2021-10-27 ENCOUNTER — Telehealth: Payer: Self-pay | Admitting: Family Medicine

## 2021-10-27 DIAGNOSIS — F03B0 Unspecified dementia, moderate, without behavioral disturbance, psychotic disturbance, mood disturbance, and anxiety: Secondary | ICD-10-CM | POA: Diagnosis not present

## 2021-10-27 DIAGNOSIS — M6281 Muscle weakness (generalized): Secondary | ICD-10-CM | POA: Diagnosis not present

## 2021-10-27 DIAGNOSIS — Z9181 History of falling: Secondary | ICD-10-CM | POA: Diagnosis not present

## 2021-10-27 DIAGNOSIS — K6289 Other specified diseases of anus and rectum: Secondary | ICD-10-CM | POA: Diagnosis not present

## 2021-10-27 DIAGNOSIS — E039 Hypothyroidism, unspecified: Secondary | ICD-10-CM | POA: Diagnosis not present

## 2021-10-27 DIAGNOSIS — R32 Unspecified urinary incontinence: Secondary | ICD-10-CM | POA: Diagnosis not present

## 2021-10-27 NOTE — Telephone Encounter (Signed)
FYI:Pt fell In her home yesterday, her husband helped her up. Beth calling from [facility] thinks the fall was due to poor lighting and her limited vision. Beth @ [phone].

## 2021-10-27 NOTE — Telephone Encounter (Signed)
FYI message below, called to check on patient no answer LMTCB

## 2021-10-28 ENCOUNTER — Telehealth: Payer: Self-pay | Admitting: Family Medicine

## 2021-10-28 NOTE — Telephone Encounter (Signed)
Left message for patient to call back and schedule Medicare Annual Wellness Visit (AWV).   Please offer to do virtually or by telephone.  Left office number and my jabber [phone].  AWVI eligible as of 12/03/2009  Please schedule at anytime with Nurse Health Advisor.

## 2021-10-29 ENCOUNTER — Other Ambulatory Visit: Payer: Medicare Other

## 2021-11-04 DIAGNOSIS — K6289 Other specified diseases of anus and rectum: Secondary | ICD-10-CM | POA: Diagnosis not present

## 2021-11-04 DIAGNOSIS — R32 Unspecified urinary incontinence: Secondary | ICD-10-CM | POA: Diagnosis not present

## 2021-11-04 DIAGNOSIS — E039 Hypothyroidism, unspecified: Secondary | ICD-10-CM | POA: Diagnosis not present

## 2021-11-04 DIAGNOSIS — Z9181 History of falling: Secondary | ICD-10-CM | POA: Diagnosis not present

## 2021-11-04 DIAGNOSIS — M6281 Muscle weakness (generalized): Secondary | ICD-10-CM | POA: Diagnosis not present

## 2021-11-04 DIAGNOSIS — F03B0 Unspecified dementia, moderate, without behavioral disturbance, psychotic disturbance, mood disturbance, and anxiety: Secondary | ICD-10-CM | POA: Diagnosis not present

## 2021-11-09 DIAGNOSIS — M6281 Muscle weakness (generalized): Secondary | ICD-10-CM | POA: Diagnosis not present

## 2021-11-09 DIAGNOSIS — Z9181 History of falling: Secondary | ICD-10-CM | POA: Diagnosis not present

## 2021-11-09 DIAGNOSIS — R32 Unspecified urinary incontinence: Secondary | ICD-10-CM | POA: Diagnosis not present

## 2021-11-09 DIAGNOSIS — E039 Hypothyroidism, unspecified: Secondary | ICD-10-CM | POA: Diagnosis not present

## 2021-11-09 DIAGNOSIS — F03B0 Unspecified dementia, moderate, without behavioral disturbance, psychotic disturbance, mood disturbance, and anxiety: Secondary | ICD-10-CM | POA: Diagnosis not present

## 2021-11-09 DIAGNOSIS — K6289 Other specified diseases of anus and rectum: Secondary | ICD-10-CM | POA: Diagnosis not present

## 2021-11-10 DIAGNOSIS — M6281 Muscle weakness (generalized): Secondary | ICD-10-CM | POA: Diagnosis not present

## 2021-11-10 DIAGNOSIS — K6289 Other specified diseases of anus and rectum: Secondary | ICD-10-CM | POA: Diagnosis not present

## 2021-11-10 DIAGNOSIS — Z9181 History of falling: Secondary | ICD-10-CM | POA: Diagnosis not present

## 2021-11-10 DIAGNOSIS — F03B0 Unspecified dementia, moderate, without behavioral disturbance, psychotic disturbance, mood disturbance, and anxiety: Secondary | ICD-10-CM | POA: Diagnosis not present

## 2021-11-10 DIAGNOSIS — E039 Hypothyroidism, unspecified: Secondary | ICD-10-CM | POA: Diagnosis not present

## 2021-11-10 DIAGNOSIS — R32 Unspecified urinary incontinence: Secondary | ICD-10-CM | POA: Diagnosis not present

## 2021-11-11 ENCOUNTER — Telehealth: Payer: Self-pay | Admitting: Family Medicine

## 2021-11-11 DIAGNOSIS — K6289 Other specified diseases of anus and rectum: Secondary | ICD-10-CM | POA: Diagnosis not present

## 2021-11-11 DIAGNOSIS — F03B0 Unspecified dementia, moderate, without behavioral disturbance, psychotic disturbance, mood disturbance, and anxiety: Secondary | ICD-10-CM | POA: Diagnosis not present

## 2021-11-11 DIAGNOSIS — E039 Hypothyroidism, unspecified: Secondary | ICD-10-CM | POA: Diagnosis not present

## 2021-11-11 DIAGNOSIS — Z9181 History of falling: Secondary | ICD-10-CM | POA: Diagnosis not present

## 2021-11-11 DIAGNOSIS — R32 Unspecified urinary incontinence: Secondary | ICD-10-CM | POA: Diagnosis not present

## 2021-11-11 NOTE — Telephone Encounter (Signed)
Kelly from [facility] [phone] )called and stated pt need verbal orders  1- once every other week for 2 weeks  2-once every other week for 3 weeks  3- verbal orders for home health physical therapy to be extended  Claiborne Billings stated that you can give the verbal orders over the phone

## 2021-11-12 NOTE — Telephone Encounter (Signed)
Unable to reach pt, lvm for pt to call back. To provide verbal orders okayed per Dr.Kremer

## 2021-11-15 DIAGNOSIS — K6289 Other specified diseases of anus and rectum: Secondary | ICD-10-CM | POA: Diagnosis not present

## 2021-11-15 DIAGNOSIS — F03B0 Unspecified dementia, moderate, without behavioral disturbance, psychotic disturbance, mood disturbance, and anxiety: Secondary | ICD-10-CM | POA: Diagnosis not present

## 2021-11-15 DIAGNOSIS — E039 Hypothyroidism, unspecified: Secondary | ICD-10-CM | POA: Diagnosis not present

## 2021-11-15 DIAGNOSIS — Z9181 History of falling: Secondary | ICD-10-CM | POA: Diagnosis not present

## 2021-11-15 DIAGNOSIS — R32 Unspecified urinary incontinence: Secondary | ICD-10-CM | POA: Diagnosis not present

## 2021-11-22 DIAGNOSIS — E039 Hypothyroidism, unspecified: Secondary | ICD-10-CM | POA: Diagnosis not present

## 2021-11-22 DIAGNOSIS — F03B0 Unspecified dementia, moderate, without behavioral disturbance, psychotic disturbance, mood disturbance, and anxiety: Secondary | ICD-10-CM | POA: Diagnosis not present

## 2021-11-22 DIAGNOSIS — R32 Unspecified urinary incontinence: Secondary | ICD-10-CM | POA: Diagnosis not present

## 2021-11-22 DIAGNOSIS — K6289 Other specified diseases of anus and rectum: Secondary | ICD-10-CM | POA: Diagnosis not present

## 2021-11-22 DIAGNOSIS — Z9181 History of falling: Secondary | ICD-10-CM | POA: Diagnosis not present

## 2021-11-29 DIAGNOSIS — Z9181 History of falling: Secondary | ICD-10-CM | POA: Diagnosis not present

## 2021-11-29 DIAGNOSIS — K6289 Other specified diseases of anus and rectum: Secondary | ICD-10-CM | POA: Diagnosis not present

## 2021-11-29 DIAGNOSIS — R32 Unspecified urinary incontinence: Secondary | ICD-10-CM | POA: Diagnosis not present

## 2021-11-29 DIAGNOSIS — E039 Hypothyroidism, unspecified: Secondary | ICD-10-CM | POA: Diagnosis not present

## 2021-11-29 DIAGNOSIS — F03B0 Unspecified dementia, moderate, without behavioral disturbance, psychotic disturbance, mood disturbance, and anxiety: Secondary | ICD-10-CM | POA: Diagnosis not present

## 2021-12-03 ENCOUNTER — Ambulatory Visit: Payer: Medicare Other | Admitting: Physician Assistant

## 2021-12-03 NOTE — Progress Notes (Incomplete)
Assessment/Plan:   Dementia likely due to  Cynthia Perry is a very pleasant 76 y.o. 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 Patient is currently on memantine 5 mg twice daily. MRI brain personally reviewed was remarkable for       Follow up in   months. Repeat neurocognitive testing for clarity of the diagnosis and disease progression Continue to control mood as per PCP Continue to control cardiovascular risk factors    Subjective:    This patient is accompanied in the office by *** who supplements the history.  Previous records as well as any outside records available were reviewed prior to todays visit.  She was last seen on 05/25/2021, with last [facility] on 07/27/2020 at 7/30   Any changes in memory since last visit? repeats oneself?  Endorsed Disoriented when walking into a room?  There are times she is disoriented in the house, despite having lived there for 35 years.  She does talk about going back to [place] where she is from. Leaving objects in unusual places?  Endorsed*** Ambulates  with difficulty?  She uses a cane to ambulate, walker as well.  She is worried about falling.  She does not move much around the house, not interested in physical therapy***.  Her daughter notices that the steps are smaller than before.  No shuffling. Recent falls?  Daughter is not aware of any falls*** Any head injuries?  Patient denies   History of seizures?   Patient denies   Wandering behavior?  "She does not like to go outside ". Patient drives?   Patient no longer drives  Any mood changes since last visit?  Patient denies   Any worsening depression?:  Patient denies   Hallucinations?  She has a history of  hallucinations, controlled with medications. Paranoia?  Chronic.  She does not like to go outside "just in case someone sees her "*** Patient reports that sleeps well with  vivid dreams, REM behavior or sleepwalking   History of sleep apnea?  Patient denies   Any hygiene concerns?  She is afraid of falling in the shower, she does not like the water sensation, especially the cold feeling of removing clothing and going into the shower.  She continues to do so once a week.  She has to be reminded of changing clothes.  She needs an assistance with dressing and bathing.*** Does the patient needs help with medications?  in charge *** Who is in charge of the finances?   is in charge  *** Any changes in appetite?  Patient denies  ***she does not drink enough water. Patient have trouble swallowing? Patient denies   Does the patient cook?  Husband does most of the cooking. Any kitchen accidents such as leaving the stove on? Patient denies   Any headaches?  Patient denies   Double vision? Patient denies   Any focal numbness or tingling?  Patient denies   Chronic back pain Patient denies   Unilateral weakness?  Patient denies   Any tremors?  She has a history of mild bilateral tremors, left greater than right, not worse from prior.  She is not dropping objects.  No increased elevation, no severe foot shuffling.*** Any history of anosmia?  Patient denies  Any incontinence of urine?  Endorsed Any bowel dysfunction?  She has some flares of ulcerative colitis with diarrhea, she needs Depends. Patient lives with: Husband, her daughter lives nearby, very involved in her care.    Initial Visit 07/27/20 Cynthia Perry seen in neurologic consultation at the request of Libby Maw,* for the evaluation of memory.  The patient is accompanied by daughter who supplements the history.  This patient is accompanied in the office by her daughter who supplements the history.  Previous records as well as any  outside records available were reviewed prior to todays visit.      She is a  very pleasant 76 y.o. year old  [place] female who has had memory issues for about 1 year, worse over the last 6 months. It began with some depression and difficulty concentrating. Over the last 2 months, it progressed to inability to  remember the medicines to take, and misplacing them. Daughter reports that she found some meds on a plastic bag, some in her pocket. Now, she is monitored daily and the medicines are in a pillbox, "seems to help".  She has also been more disoriented. She  sees dead family members and at times does not recognize the place she lives. She has left cat food on the counter, but forgot to feed her cat.  Misplaces objects, such as keys to the safe, found in her pants pocket. She constantly moves objects from place to place.  Her mood has been changing as well. She is scared at night with vivid dreams, but not sleepwalking; she does have auditory hallucinations.  She has some paranoia about her cats, and she calls her children to make sure that they did not run away. Denies irritability. She has been less mobile, and other than watching TV and being with her husband, does not participate in any adult activities/programs.  She forgets to bathe, but she is able to dress by herself. She does not cook, only uses the microwave although she has forgotten how to use it.  She had left an egg on the stove and forgot to use the pan recently, for which her daughter felt cooking was unsafe for her. Her daughter helps with the meals and with other house chores, trying to keep the house clean, because she finds these chores more difficult . Appetite is good without trouble swallowing or sialorrhea. Ambulates  with a L cane, entertaining to start using a walker.  Patient no longer drives.  Daughter assists with the bill payments. Denies headaches, falls, or injuries to the head, double vision, dizziness, focal numbness or tingling,  unilateral weakness . She has bilateral tremors, but not dropping objects. Denies sialorrhea.  Denies urine incontinence or retention. Denies constipation or diarrhea. Denies anosmia. Denies history of OSA, ETOH  Tobacco. Family History negative for dementia        TSH normal 0.68 B12  >1550  (f/u by PCP)  LDL elevated  170  TC 274, VLDL 50   Ca 11.8 ( pt dehydrated per chart, they will repeat)      Neurocognitive diagnosis 12/30/2020 "Cynthia Perry pattern of performance is suggestive of severe, diffuse cognitive impairment. All domains able to be assessed (i.e., processing speed, cognitive flexibility, verbal fluency, visuoconstructional abilities, and learning and memory) exhibited severe impairment with unfortunately no tasks approaching adequate normative performances. The most likely etiology for ongoing dysfunction at the present time is unfortunately Alzheimer's disease. Despite limited testing tolerance, Cynthia Perry was fully amnestic  after a delay of approximately five minutes across both story and figure-based memory tasks. When asked to recall any information she remembered, she made comments not recalling being read a story previously or that she had attempted to copy a complex figure. Overall, this pattern of memory impairment suggests the presence of rapid forgetting and a severe memory storage deficit, which are hallmark characteristics of Alzheimer's disease.  "   PREVIOUS MEDICATIONS:   CURRENT MEDICATIONS:  Outpatient Encounter Medications as of 12/03/2021  Medication Sig   acetaminophen (TYLENOL) 325 MG tablet Take 650 mg by mouth every 6 (six) hours as needed for moderate pain.   feeding supplement (ENSURE ENLIVE / ENSURE PLUS) LIQD Take 237 mLs by mouth 2 (two) times daily between meals.   HYDROcodone-acetaminophen (NORCO/VICODIN) 5-325 MG tablet Take 1 tablet by mouth every 6 (six) hours as needed for severe pain. (Patient not taking: Reported on 09/08/2021)   memantine (NAMENDA)  5 MG tablet Take 1 tablet (11m at night) for 2 weeks, then increase to 1 tablet (538m twice a day (Patient taking differently: Take 5 mg by mouth at bedtime.)   mesalamine (APRISO) 0.375 g 24 hr capsule TAKE 4 CAPSULES BY MOUTH EVERY DAY IN THE MORNING   Multiple Vitamin (MULTIVITAMIN WITH MINERALS) TABS tablet Take 1 tablet by mouth daily. (Patient not taking: Reported on 07/12/2021)   Nystatin (GERHARDT'S BUTT CREAM) CREA Apply 1 Application topically 2 (two) times daily.   QUEtiapine (SEROQUEL) 25 MG tablet Take 0.5 tablets (12.5 mg total) by mouth daily.   QUEtiapine (SEROQUEL) 25 MG tablet Take 1 tablet (25 mg total) by mouth at bedtime.   No facility-administered encounter medications on file as of 12/03/2021.        No data to display            07/27/2020    9:00 AM  Montreal Cognitive Assessment   Visuospatial/ Executive (0/5) 1  Naming (0/3) 1  Attention: Read list of digits (0/2) 2  Attention: Read list of letters (0/1) 0  Attention: Serial 7 subtraction starting at 100 (0/3) 0  Language: Repeat phrase (0/2) 2  Language : Fluency (0/1) 0  Abstraction (0/2) 0  Delayed Recall (0/5) 0  Orientation (0/6) 0  Total 6  Adjusted Score (based on education) 7    Objective:     PHYSICAL EXAMINATION:    VITALS:  There were no vitals filed for this visit.  GEN:  The patient appears stated age and is in NAD. HEENT:  Normocephalic, atraumatic.   Neurological examination:  General: NAD, well-groomed, appears stated age. Orientation: The patient is alert. Oriented to person, place and date Cranial nerves: There is good facial symmetry.The speech is fluent and clear. No aphasia or dysarthria. Fund of knowledge is appropriate. Recent and remote memory are impaired. Attention and concentration are reduced.  Able to name objects and repeat phrases.  Hearing is intact to conversational tone.    Sensation: Sensation is intact to light touch throughout Motor: Strength is at least  antigravity x4. Tremors: Left greater than right hand tremor, no myoclonus or asterixis  DTR's 2/4 in UE/LE     Movement examination: Tone: There is normal tone in the UE/LE Coordination:  There is no decremation with RAM's. Normal finger to nose  Gait and Station: The patient has no difficulty arising out of a deep-seated chair without the use of the hands. The patient's stride length is good.  Gait is cautious and narrow.    Thank  you for allowing [place] the opportunity to participate in the care of this nice patient. Please do not hesitate to contact us for any questions or concerns.   Total time spent on today's visit was *** minutes dedicated to this patient today, preparing to see patient, examining the patient, ordering tests and/or medications and counseling the patient, documenting clinical information in the EHR or other health record, independently interpreting results and communicating results to the patient/family, discussing treatment and goals, answering patient's questions and coordinating care.  Cc:  Libby Maw, MD  Sharene Butters 12/03/2021 7:24 AM

## 2021-12-10 DIAGNOSIS — Z9181 History of falling: Secondary | ICD-10-CM | POA: Diagnosis not present

## 2021-12-10 DIAGNOSIS — R32 Unspecified urinary incontinence: Secondary | ICD-10-CM | POA: Diagnosis not present

## 2021-12-10 DIAGNOSIS — E039 Hypothyroidism, unspecified: Secondary | ICD-10-CM | POA: Diagnosis not present

## 2021-12-10 DIAGNOSIS — F03B0 Unspecified dementia, moderate, without behavioral disturbance, psychotic disturbance, mood disturbance, and anxiety: Secondary | ICD-10-CM | POA: Diagnosis not present

## 2021-12-10 DIAGNOSIS — K6289 Other specified diseases of anus and rectum: Secondary | ICD-10-CM | POA: Diagnosis not present

## 2021-12-11 DIAGNOSIS — F03B0 Unspecified dementia, moderate, without behavioral disturbance, psychotic disturbance, mood disturbance, and anxiety: Secondary | ICD-10-CM | POA: Diagnosis not present

## 2021-12-11 DIAGNOSIS — Z9181 History of falling: Secondary | ICD-10-CM | POA: Diagnosis not present

## 2021-12-11 DIAGNOSIS — K6289 Other specified diseases of anus and rectum: Secondary | ICD-10-CM | POA: Diagnosis not present

## 2021-12-11 DIAGNOSIS — E039 Hypothyroidism, unspecified: Secondary | ICD-10-CM | POA: Diagnosis not present

## 2021-12-11 DIAGNOSIS — R32 Unspecified urinary incontinence: Secondary | ICD-10-CM | POA: Diagnosis not present

## 2021-12-16 DIAGNOSIS — F03B0 Unspecified dementia, moderate, without behavioral disturbance, psychotic disturbance, mood disturbance, and anxiety: Secondary | ICD-10-CM | POA: Diagnosis not present

## 2021-12-16 DIAGNOSIS — R32 Unspecified urinary incontinence: Secondary | ICD-10-CM | POA: Diagnosis not present

## 2021-12-16 DIAGNOSIS — K6289 Other specified diseases of anus and rectum: Secondary | ICD-10-CM | POA: Diagnosis not present

## 2021-12-16 DIAGNOSIS — E039 Hypothyroidism, unspecified: Secondary | ICD-10-CM | POA: Diagnosis not present

## 2021-12-16 DIAGNOSIS — Z9181 History of falling: Secondary | ICD-10-CM | POA: Diagnosis not present

## 2021-12-22 ENCOUNTER — Telehealth: Payer: Self-pay | Admitting: Family Medicine

## 2021-12-22 DIAGNOSIS — K6289 Other specified diseases of anus and rectum: Secondary | ICD-10-CM | POA: Diagnosis not present

## 2021-12-22 DIAGNOSIS — Z9181 History of falling: Secondary | ICD-10-CM | POA: Diagnosis not present

## 2021-12-22 DIAGNOSIS — F03B0 Unspecified dementia, moderate, without behavioral disturbance, psychotic disturbance, mood disturbance, and anxiety: Secondary | ICD-10-CM | POA: Diagnosis not present

## 2021-12-22 DIAGNOSIS — E039 Hypothyroidism, unspecified: Secondary | ICD-10-CM | POA: Diagnosis not present

## 2021-12-22 DIAGNOSIS — R32 Unspecified urinary incontinence: Secondary | ICD-10-CM | POA: Diagnosis not present

## 2021-12-22 NOTE — Telephone Encounter (Signed)
Left message for patient to call back and schedule Medicare Annual Wellness Visit (AWV) in office.   If not able to come in office, please offer to do virtually or by telephone.  Left office number and my jabber [phone].  AWVI eligible as of 12/03/2009  Please schedule at anytime with Nurse Health Advisor.

## 2022-01-05 ENCOUNTER — Ambulatory Visit (INDEPENDENT_AMBULATORY_CARE_PROVIDER_SITE_OTHER): Payer: Medicare Other | Admitting: Physician Assistant

## 2022-01-05 ENCOUNTER — Encounter: Payer: Self-pay | Admitting: Physician Assistant

## 2022-01-05 VITALS — BP 125/63 | HR 80 | Resp 20 | Ht 63.0 in | Wt 112.0 lb

## 2022-01-05 DIAGNOSIS — F0280 Dementia in other diseases classified elsewhere without behavioral disturbance: Secondary | ICD-10-CM | POA: Diagnosis not present

## 2022-01-05 DIAGNOSIS — G309 Alzheimer's disease, unspecified: Secondary | ICD-10-CM | POA: Diagnosis not present

## 2022-01-05 MED ORDER — MEMANTINE HCL 5 MG PO TABS
5.0000 mg | ORAL_TABLET | Freq: Every day | ORAL | 11 refills | Status: DC
Start: 2022-01-05 — End: 2022-06-08

## 2022-01-05 NOTE — Patient Instructions (Signed)
It was a pleasure to see you today at our office.   Recommendations:  Follow up in 6 months Continue Memantine 5  daily.Side effects were discussed     Whom to call:  Memory  decline, memory medications: Call out office [phone]   For psychiatric meds, mood meds: Please have your primary care physician manage these medications.      For assessment of decision of mental capacity and competency:  Call Dr. Anthoney Harada, geriatric psychiatrist at [phone]  For guidance in geriatric dementia issues please call [facility] [phone]   If you have any severe symptoms of a stroke, or other severe issues such as confusion,severe chills or fever, etc call 911 or go to the ER as you may need to be evaluate further     Feel free to visit Facebook page " [email or web address]" for tips of how to care for people with memory problems.         RECOMMENDATIONS FOR ALL PATIENTS WITH MEMORY PROBLEMS: 1. Continue to exercise (Recommend 30 minutes of walking everyday, or 3 hours every week) 2. Increase social interactions - continue going to [place] and enjoy social gatherings with friends and family 3. Eat healthy, avoid fried foods and eat more fruits and vegetables 4. Maintain adequate blood pressure, blood sugar, and blood cholesterol level. Reducing the risk of stroke and cardiovascular disease also helps promoting better memory. 5. Avoid stressful situations. Live a simple life and avoid aggravations. Organize your time and prepare for the next day in anticipation. 6. Sleep well, avoid any interruptions of sleep and avoid any distractions in the bedroom that may interfere with adequate sleep quality 7. Avoid sugar, avoid sweets as there is a strong link between excessive sugar intake, diabetes, and cognitive impairment We discussed the Mediterranean diet, which has been shown to help patients reduce the risk of progressive memory disorders and reduces cardiovascular risk. This includes  eating fish, eat fruits and green leafy vegetables, nuts like almonds and hazelnuts, walnuts, and also use olive oil. Avoid fast foods and fried foods as much as possible. Avoid sweets and sugar as sugar use has been linked to worsening of memory function.  There is always a concern of gradual progression of memory problems. If this is the case, then we may need to adjust level of care according to patient needs. Support, both to the patient and caregiver, should then be put into place.    The Alzheimer's Association is here all day, every day for people facing Alzheimer's disease through our free 24/7 Helpline: [phone]. The Helpline provides reliable information and support to all those who need assistance, such as individuals living with memory loss, Alzheimer's or other dementia, caregivers, health care professionals and the public.  Our highly trained and knowledgeable staff can help you with: Understanding memory loss, dementia and Alzheimer's  Medications and other treatment options  General information about aging and brain health  Skills to provide quality care and to find the best care from professionals  Legal, financial and living-arrangement decisions Our Helpline also features: Confidential care consultation provided by master's level clinicians who can help with decision-making support, crisis assistance and education on issues families face every day  Help in a caller's preferred language using our translation service that features more than 200 languages and dialects  Referrals to local community programs, services and ongoing support     FALL PRECAUTIONS: Be cautious when walking. Scan the area for obstacles that may increase the risk of  trips and falls. When getting up in the mornings, sit up at the edge of the bed for a few minutes before getting out of bed. Consider elevating the bed at the head end to avoid drop of blood pressure when getting up. Walk always in a  well-lit room (use night lights in the walls). Avoid area rugs or power cords from appliances in the middle of the walkways. Use a walker or a cane if necessary and consider physical therapy for balance exercise. Get your eyesight checked regularly.  FINANCIAL OVERSIGHT: Supervision, especially oversight when making financial decisions or transactions is also recommended.  HOME SAFETY: Consider the safety of the kitchen when operating appliances like stoves, microwave oven, and blender. Consider having supervision and share cooking responsibilities until no longer able to participate in those. Accidents with firearms and other hazards in the house should be identified and addressed as well.   ABILITY TO BE LEFT ALONE: If patient is unable to contact 911 operator, consider using LifeLine, or when the need is there, arrange for someone to stay with patients. Smoking is a fire hazard, consider supervision or cessation. Risk of wandering should be assessed by caregiver and if detected at any point, supervision and safe proof recommendations should be instituted.  MEDICATION SUPERVISION: Inability to self-administer medication needs to be constantly addressed. Implement a mechanism to ensure safe administration of the medications.   DRIVING: Regarding driving, in patients with progressive memory problems, driving will be impaired. We advise to have someone else do the driving if trouble finding directions or if minor accidents are reported. Independent driving assessment is available to determine safety of driving.   If you are interested in the driving assessment, you can contact the following:  [employer] in [place]  [place] [phone] or [phone]      [place] refers to food and lifestyle choices that are based on the traditions of  countries located on the [employer]. This way of eating has been shown to help prevent certain conditions and improve outcomes for people who have chronic diseases, like kidney disease and heart disease. What are tips for following this plan? Lifestyle  Cook and eat meals together with your family, when possible. Drink enough fluid to keep your urine clear or pale yellow. Be physically active every day. This includes: Aerobic exercise like running or swimming. Leisure activities like gardening, walking, or housework. Get 7-8 hours of sleep each night. If recommended by your health care provider, drink red wine in moderation. This means 1 glass a day for nonpregnant women and 2 glasses a day for men. A glass of wine equals 5 oz (150 mL). Reading food labels  Check the serving size of packaged foods. For foods such as rice and pasta, the serving size refers to the amount of cooked product, not dry. Check the total fat in packaged foods. Avoid foods that have saturated fat or trans fats. Check the ingredients list for added sugars, such as corn syrup. Shopping  At the grocery store, buy most of your food from the areas near the walls of the store. This includes: Fresh fruits and vegetables (produce). Grains, beans, nuts, and seeds. Some of these may be available in unpackaged forms or large amounts (in bulk). Fresh seafood. Poultry and eggs. Low-fat dairy products. Buy whole ingredients instead of prepackaged foods. Buy fresh fruits and vegetables in-season from local farmers markets.  Buy frozen fruits and vegetables in resealable bags. If you do not have access to quality fresh seafood, buy precooked frozen shrimp or canned fish, such as tuna, salmon, or sardines. Buy small amounts of raw or cooked vegetables, salads, or olives from the deli or salad bar at your store. Stock your pantry so you always have certain foods on hand, such as olive oil, canned tuna, canned tomatoes, rice,  pasta, and beans. Cooking  Cook foods with extra-virgin olive oil instead of using butter or other vegetable oils. Have meat as a side dish, and have vegetables or grains as your main dish. This means having meat in small portions or adding small amounts of meat to foods like pasta or stew. Use beans or vegetables instead of meat in common dishes like chili or lasagna. Experiment with different cooking methods. Try roasting or broiling vegetables instead of steaming or sauteing them. Add frozen vegetables to soups, stews, pasta, or rice. Add nuts or seeds for added healthy fat at each meal. You can add these to yogurt, salads, or vegetable dishes. Marinate fish or vegetables using olive oil, lemon juice, garlic, and fresh herbs. Meal planning  Plan to eat 1 vegetarian meal one day each week. Try to work up to 2 vegetarian meals, if possible. Eat seafood 2 or more times a week. Have healthy snacks readily available, such as: Vegetable sticks with hummus. Greek yogurt. Fruit and nut trail mix. Eat balanced meals throughout the week. This includes: Fruit: 2-3 servings a day Vegetables: 4-5 servings a day Low-fat dairy: 2 servings a day Fish, poultry, or lean meat: 1 serving a day Beans and legumes: 2 or more servings a week Nuts and seeds: 1-2 servings a day Whole grains: 6-8 servings a day Extra-virgin olive oil: 3-4 servings a day Limit red meat and sweets to only a few servings a month What are my food choices? Mediterranean diet Recommended Grains: Whole-grain pasta. Brown rice. Bulgar wheat. Polenta. Couscous. Whole-wheat bread. Orpah Cobb. Vegetables: Artichokes. Beets. Broccoli. Cabbage. Carrots. Eggplant. Green beans. Chard. Kale. Spinach. Onions. Leeks. Peas. Squash. Tomatoes. Peppers. Radishes. Fruits: Apples. Apricots. Avocado. Berries. Bananas. Cherries. Dates. Figs. Grapes. Lemons. Melon. Oranges. Peaches. Plums. Pomegranate. Meats and other protein foods: Beans.  Almonds. Sunflower seeds. Pine nuts. Peanuts. Cod. Salmon. Scallops. Shrimp. Tuna. Tilapia. Clams. Oysters. Eggs. Dairy: Low-fat milk. Cheese. Greek yogurt. Beverages: Water. Red wine. Herbal tea. Fats and oils: Extra virgin olive oil. Avocado oil. Grape seed oil. Sweets and desserts: [place] yogurt with honey. Baked apples. Poached pears. Trail mix. Seasoning and other foods: Basil. Cilantro. Coriander. Cumin. Mint. Parsley. Sage. Rosemary. Tarragon. Garlic. Oregano. Thyme. Pepper. Balsalmic vinegar. Tahini. Hummus. Tomato sauce. Olives. Mushrooms. Limit these Grains: Prepackaged pasta or rice dishes. Prepackaged cereal with added sugar. Vegetables: Deep fried potatoes (french fries). Fruits: Fruit canned in syrup. Meats and other protein foods: Beef. Pork. Lamb. Poultry with skin. Hot dogs. Tomasa Blase. Dairy: Ice cream. Sour cream. Whole milk. Beverages: Juice. Sugar-sweetened soft drinks. Beer. Liquor and spirits. Fats and oils: Butter. Canola oil. Vegetable oil. Beef fat (tallow). Lard. Sweets and desserts: Cookies. Cakes. Pies. Candy. Seasoning and other foods: Mayonnaise. Premade sauces and marinades. The items listed may not be a complete list. Talk with your dietitian about what dietary choices are right for you. Summary The Mediterranean diet includes both food and lifestyle choices. Eat a variety of fresh fruits and vegetables, beans, nuts, seeds, and whole grains. Limit the amount of red meat and sweets that you eat. Talk  with your health care provider about whether it is safe for you to drink red wine in moderation. This means 1 glass a day for nonpregnant women and 2 glasses a day for men. A glass of wine equals 5 oz (150 mL). This information is not intended to replace advice given to you by your health care provider. Make sure you discuss any questions you have with your health care provider. Document Released: 08/13/2015 Document Revised: 09/15/2015 Document Reviewed:  08/13/2015 Elsevier Interactive Patient Education  2017 [employer].

## 2022-01-05 NOTE — Progress Notes (Signed)
Assessment/Plan:   Dementia likely due to Alzheimer's disease with behavioral disturbance  Cynthia Perry is a very pleasant 77 y.o. 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 Patient is currently on memantine 5 mg daily. Cognitive decline is noted in today's visit and she requires more assistance with ADL's .    Recommendations:    Follow up in 6 months. Continue memantine 5 mg  daily, side effects discussed Recommend good control of cardiovascular risk factors.   Continue to control mood as per PCP, continue Quetiapine  Continue 24/7 monitoring for safety   Subjective:    This patient is accompanied in the office by her daughter who supplements the history.  Previous records as well as any outside records available were reviewed prior to todays visit. Patient was last seen on 05/25/2021, last MoCA on 07/27/2020 was 7/30   Any changes in memory since last visit?  "It's pretty good"-she says.  Daughter reports that her memory is usually worse during UC flare, last one over the summer. She has moments of disorientation. Conversations are tangential, she "changes the direction frequently".  repeats oneself?  Endorsed Disoriented when walking into a room?  There are times where she is disoriented in the house even if she leaves there for 35 years. She tells her daughter about objects that are upstairs,to look for them, but she may be referring to her old house in [place].    Leaving objects in unusual places?  Endorsed Wandering behavior?  denies, she does not like to go outside at all except when she had UTI and went to the neighbors house. Since then, her daughter placed the house  alarm to be linked to her phone.    Any personality changes since last visit?  Denies. "She is always very sweet" Any worsening depression?:  Denies. May become uncomfortable when she has a UC flare.   Hallucinations or paranoia? Thinks there are more cats  than  the ones she has.   Seizures?    denies    Any sleep changes?  Sleeps well. Denies vivid dreams, REM behavior or sleepwalking   Sleep apnea?   denies   Any hygiene concerns?  Patient is afraid she is going to fall if she goes to the shower, does not like the sensation of water, especially the cold feeling of removing clothing and going into the shower.  She has to be reminded of changing clothes.  She needs an assistance with dressing and bathing. " Does not know what to do when she gets there". She places her clothes backwards.  She has a seat in the shower, and that helps  Does the patient needs help with medications?  Daughter is in charge   Who is in charge of the finances?  Daughter is in charge     Any changes in appetite?  denies     Patient have trouble swallowing?  denies   Does the patient cook? No   Any kitchen accidents such as leaving the stove on? Patient denies   Any headaches?   denies   Chronic back pain  denies   Ambulates with difficulty?  Patient has a cane to ambulate, she has difficulty using it.  She is using a walker  for stability, she is worried about falling.  She is not interested in physical therapy the daughter says.  Her daughter notices that her steps are smaller than prior.  No shuffling.  Recent falls or head injuries? denies     Unilateral weakness, numbness or tingling?  denies   Any tremors?  She has a history of mild bilateral tremors, left greater than right, not worse from prior.  She denies dropping objects or  increased salivation . Any anosmia?  Patient denies   Any incontinence of urine?  Endorsed, she wears Depends Any bowel dysfunction?  She has some flares of ulcerative colitis which last  about 2 weeks, she uses diapers "just in case ".     Patient lives  husband daughter lives nearby and is very involved in her care. Does the patient drive?No longer drives    Initial Visit 07/27/20 Cynthia Perry seen in neurologic consultation at the request of Cynthia Perry,* for the evaluation of memory.  The patient is accompanied by daughter who supplements the history.  This patient is accompanied in the office by her daughter who supplements the history.  Previous records as well as any outside records available were reviewed prior to todays visit.      She is a  very pleasant 77 y.o. year old  Cynthia Perry female who has had memory issues for about 1 year, worse over the last 6 months. It began with some depression and difficulty concentrating. Over the last 2 months, it progressed to inability to  remember the medicines to take, and misplacing them. Daughter reports that she found some meds on a plastic bag, some in her pocket. Now, she is monitored daily and the medicines are in a pillbox, "seems to help".  She has also been more disoriented. She  sees dead family members and at times does not recognize the place she lives. She has left cat food on the counter, but forgot to feed her cat.  Misplaces objects, such as keys to the safe, found in her pants pocket. She constantly moves objects from place to place.  Her mood has been changing as well. She is scared at night with vivid dreams, but not sleepwalking; she does have auditory hallucinations.  She has some paranoia about her cats, and she calls her children to make sure that they did not run away. Denies irritability. She has been less mobile, and other than watching TV and being with her husband, does not participate in any adult activities/programs.  She forgets to bathe, but she is able to dress by herself. She does not cook, only uses the microwave although she has forgotten how to use it.  She had left an egg on the stove and forgot to use  the pan recently, for which her daughter felt cooking was unsafe for her. Her daughter helps with the meals and with other house chores, trying to keep the house clean, because she finds these chores more difficult . Appetite is good without trouble swallowing or sialorrhea. Ambulates  with a L cane, entertaining to start using a walker.  Patient no longer drives.  Daughter assists with the bill payments. Denies headaches, falls, or injuries to the head, double vision, dizziness, focal numbness or tingling, unilateral weakness . She has bilateral tremors, but not dropping objects. Denies sialorrhea.  Denies urine incontinence or retention. Denies constipation or diarrhea. Denies anosmia. Denies history of OSA, ETOH  Tobacco. Family History negative for dementia  TSH normal 0.68 B12  >1550  (f/u by PCP)  LDL elevated  170  TC 274, VLDL 50   Ca 11.8 ( pt dehydrated per chart, they will repeat)      Neurocognitive diagnosis 12/30/2020 "Ms. Lewallen's pattern of performance is suggestive of severe, diffuse cognitive impairment. All domains able to be assessed (i.e., processing speed, cognitive flexibility, verbal fluency, visuoconstructional abilities, and learning and memory) exhibited severe impairment with unfortunately no tasks approaching adequate normative performances. The most likely etiology for ongoing dysfunction at the present time is unfortunately Alzheimer's disease. Despite limited testing tolerance, Ms. Labarbera was fully amnestic after a delay of approximately five minutes across both story and figure-based memory tasks. When asked to recall any information she remembered, she made comments not recalling being read a story previously or that she had attempted to copy a complex figure. Overall, this pattern of memory impairment suggests the presence of rapid forgetting and a severe memory storage deficit, which are hallmark characteristics of Alzheimer's disease.  "   PREVIOUS MEDICATIONS:    CURRENT MEDICATIONS:  Outpatient Encounter Medications as of 01/05/2022  Medication Sig   acetaminophen (TYLENOL) 325 MG tablet Take 650 mg by mouth every 6 (six) hours as needed for moderate pain.   feeding supplement (ENSURE ENLIVE / ENSURE PLUS) LIQD Take 237 mLs by mouth 2 (two) times daily between meals.   HYDROcodone-acetaminophen (NORCO/VICODIN) 5-325 MG tablet Take 1 tablet by mouth every 6 (six) hours as needed for severe pain.   mesalamine (APRISO) 0.375 g 24 hr capsule TAKE 4 CAPSULES BY MOUTH EVERY DAY IN THE MORNING   Multiple Vitamin (MULTIVITAMIN WITH MINERALS) TABS tablet Take 1 tablet by mouth daily.   Nystatin (GERHARDT'S BUTT CREAM) CREA Apply 1 Application topically 2 (two) times daily.   QUEtiapine (SEROQUEL) 25 MG tablet Take 1 tablet (25 mg total) by mouth at bedtime.   [DISCONTINUED] memantine (NAMENDA) 5 MG tablet Take 1 tablet (5mg  at night) for 2 weeks, then increase to 1 tablet (5mg ) twice a day (Patient taking differently: Take 5 mg by mouth at bedtime.)   memantine (NAMENDA) 5 MG tablet Take 1 tablet (5 mg total) by mouth at bedtime.   QUEtiapine (SEROQUEL) 25 MG tablet Take 0.5 tablets (12.5 mg total) by mouth daily.   No facility-administered encounter medications on file as of 01/05/2022.        No data to display            07/27/2020    9:00 AM  Montreal Cognitive Assessment   Visuospatial/ Executive (0/5) 1  Naming (0/3) 1  Attention: Read list of digits (0/2) 2  Attention: Read list of letters (0/1) 0  Attention: Serial 7 subtraction starting at 100 (0/3) 0  Language: Repeat phrase (0/2) 2  Language : Fluency (0/1) 0  Abstraction (0/2) 0  Delayed Recall (0/5) 0  Orientation (0/6) 0  Total 6  Adjusted Score (based on education) 7    Objective:     PHYSICAL EXAMINATION:    VITALS:   Vitals:   01/05/22 1522  BP: 125/63  Pulse: 80  Resp: 20  SpO2: 98%  Weight: 112 lb (50.8 kg)  Height: 5\' 3"  (1.6 m)    GEN:  The patient appears  stated age and is in NAD. HEENT:  Normocephalic, atraumatic.   Neurological examination:  General: NAD, well-groomed, appears stated age. Orientation: The patient is alert. Oriented to person, not to place or date Cranial nerves: There is good facial  symmetry.The speech is fluent and clear. No aphasia or dysarthria. Fund of knowledge is reduced. Recent and remote memory are impaired. Attention and concentration are reduced.  Able to name objects and unable to repeat phrases.  Hearing is intact to conversational tone.    Sensation: Sensation is intact to light touch throughout Motor: Strength is at least antigravity x4. DTR's 2/4 in UE/LE     Movement examination: Tone: There is normal tone in the UE/LE Abnormal movements: Mild resting and intention left greater than right hand tremor.  No myoclonus.  No asterixis.   Coordination:  There is no decremation with RAM's. Normal finger to nose after several trials  Gait and Station: The patient has some difficulty arising out of a deep-seated chair without the use of the hands. The patient's stride length is short, slight bending forward, good arm swing.  Gait is cautious and narrow.    Thank you for allowing [place] the opportunity to participate in the care of this nice patient. Please do not hesitate to contact us for any questions or concerns.   Total time spent on today's visit was 30 minutes dedicated to this patient today, preparing to see patient, examining the patient, ordering tests and/or medications and counseling the patient, documenting clinical information in the EHR or other health record, independently interpreting results and communicating results to the patient/family, discussing treatment and goals, answering patient's questions and coordinating care.  Cc:  Mliss Sax, MD  Marlowe Kays 01/05/2022 5:24 PM

## 2022-01-06 DIAGNOSIS — R32 Unspecified urinary incontinence: Secondary | ICD-10-CM | POA: Diagnosis not present

## 2022-01-06 DIAGNOSIS — E039 Hypothyroidism, unspecified: Secondary | ICD-10-CM | POA: Diagnosis not present

## 2022-01-06 DIAGNOSIS — F03B0 Unspecified dementia, moderate, without behavioral disturbance, psychotic disturbance, mood disturbance, and anxiety: Secondary | ICD-10-CM | POA: Diagnosis not present

## 2022-01-06 DIAGNOSIS — Z9181 History of falling: Secondary | ICD-10-CM | POA: Diagnosis not present

## 2022-01-06 DIAGNOSIS — K6289 Other specified diseases of anus and rectum: Secondary | ICD-10-CM | POA: Diagnosis not present

## 2022-01-26 ENCOUNTER — Telehealth: Payer: Self-pay | Admitting: Family Medicine

## 2022-01-26 NOTE — Telephone Encounter (Signed)
Left message for patient to call back and schedule Medicare Annual Wellness Visit (AWV) either virtually or in office. Left  my Cynthia Perry number [phone]   DUE AWVI 12/03/2009 PER PALMETTO please schedule with Nurse Health Adviser   45 min for awv-i  in office appointments 30 min for awv-s & awv-i phone/virtual appointments

## 2022-03-03 ENCOUNTER — Telehealth: Payer: Self-pay | Admitting: Family Medicine

## 2022-03-03 NOTE — Telephone Encounter (Signed)
Called patient to schedule Medicare Annual Wellness Visit (AWV). Left message for patient to call back and schedule Medicare Annual Wellness Visit (AWV).  Last date of AWV:  DUE AWVI 12/03/2009 PER PALMETTO  Please schedule an appointment at any time with [facility] .  If any questions, please contact me at [phone].  Thank you ,  Barkley Boards AWV direct phone # [phone]

## 2022-03-28 ENCOUNTER — Encounter: Payer: Self-pay | Admitting: Family Medicine

## 2022-03-28 ENCOUNTER — Ambulatory Visit (INDEPENDENT_AMBULATORY_CARE_PROVIDER_SITE_OTHER): Payer: Medicare Other | Admitting: Family Medicine

## 2022-03-28 VITALS — BP 100/58 | HR 95 | Temp 98.2°F | Resp 17 | Ht 63.0 in | Wt 103.4 lb

## 2022-03-28 DIAGNOSIS — G309 Alzheimer's disease, unspecified: Secondary | ICD-10-CM | POA: Diagnosis not present

## 2022-03-28 DIAGNOSIS — R410 Disorientation, unspecified: Secondary | ICD-10-CM

## 2022-03-28 DIAGNOSIS — F0280 Dementia in other diseases classified elsewhere without behavioral disturbance: Secondary | ICD-10-CM | POA: Diagnosis not present

## 2022-03-28 DIAGNOSIS — R8290 Unspecified abnormal findings in urine: Secondary | ICD-10-CM | POA: Diagnosis not present

## 2022-03-28 LAB — BASIC METABOLIC PANEL
BUN: 10 mg/dL (ref 6–23)
CO2: 28 mEq/L (ref 19–32)
Calcium: 9.8 mg/dL (ref 8.4–10.5)
Chloride: 101 mEq/L (ref 96–112)
Creatinine, Ser: 0.68 mg/dL (ref 0.40–1.20)
GFR: 83.48 mL/min (ref >60.00)
Glucose, Bld: 113 mg/dL — ABNORMAL HIGH (ref 70–99)
Potassium: 4.3 mEq/L (ref 3.5–5.1)
Sodium: 135 mEq/L (ref 135–145)

## 2022-03-28 LAB — CBC
HCT: 37.7 % (ref 36.0–46.0)
Hemoglobin: 12.3 g/dL (ref 12.0–15.0)
MCHC: 32.6 g/dL (ref 30.0–36.0)
MCV: 80.7 fl (ref 78.0–100.0)
Platelets: 434 10*3/uL — ABNORMAL HIGH (ref 150.0–400.0)
RBC: 4.68 Mil/uL (ref 3.87–5.11)
RDW: 16.3 % — ABNORMAL HIGH (ref 11.5–15.5)
WBC: 8.3 10*3/uL (ref 4.0–10.5)

## 2022-03-28 LAB — POCT URINALYSIS DIPSTICK
Bilirubin, UA: NEGATIVE
Blood, UA: NEGATIVE
Glucose, UA: NEGATIVE
Ketones, UA: NEGATIVE
Nitrite, UA: POSITIVE
Protein, UA: NEGATIVE
Spec Grav, UA: 1.015 (ref 1.010–1.025)
Urobilinogen, UA: 0.2 E.U./dL
pH, UA: 7 (ref 5.0–8.0)

## 2022-03-28 MED ORDER — SULFAMETHOXAZOLE-TRIMETHOPRIM 800-160 MG PO TABS: 1.0000 | ORAL_TABLET | Freq: Two times a day (BID) | ORAL | 0 refills | Status: AC

## 2022-03-28 NOTE — Progress Notes (Unsigned)
Established Patient Office Visit   Subjective:  Patient ID: Cynthia Perry, female    DOB: October 08, 1945  Age: 77 y.o. MRN: [id number]  Chief Complaint  Patient presents with   Urinary Tract Infection    Pt is in for possible UTI She is confused more than normal per daughter      Urinary Tract Infection    Encounter Diagnoses  Name Primary?   Confusion Yes   Major neurocognitive disorder due to Alzheimer's disease    Abnormal finding on urinalysis    Presents with her daughter who is concerned about unknown level of confusion.  In the past she believes it has been associated with a UTI and would like for her to be checked.  Thanks ODG get that UTI did you put it in the computer   {History (Optional):23778}  ROS   Current Outpatient Medications:    acetaminophen (TYLENOL) 325 MG tablet, Take 650 mg by mouth every 6 (six) hours as needed for moderate pain., Disp: , Rfl:    memantine (NAMENDA) 5 MG tablet, Take 1 tablet (5 mg total) by mouth at bedtime., Disp: 30 tablet, Rfl: 11   mesalamine (APRISO) 0.375 g 24 hr capsule, TAKE 4 CAPSULES BY MOUTH EVERY DAY IN THE MORNING, Disp: , Rfl:    Nystatin (GERHARDT'S BUTT CREAM) CREA, Apply 1 Application topically 2 (two) times daily., Disp: 1 each, Rfl: 0   sulfamethoxazole-trimethoprim (BACTRIM DS) 800-160 MG tablet, Take 1 tablet by mouth 2 (two) times daily for 3 days., Disp: 6 tablet, Rfl: 0   feeding supplement (ENSURE ENLIVE / ENSURE PLUS) LIQD, Take 237 mLs by mouth 2 (two) times daily between meals. (Patient not taking: Reported on 03/28/2022), Disp: 237 mL, Rfl: 12   HYDROcodone-acetaminophen (NORCO/VICODIN) 5-325 MG tablet, Take 1 tablet by mouth every 6 (six) hours as needed for severe pain. (Patient not taking: Reported on 03/28/2022), Disp: 10 tablet, Rfl: 0   Multiple Vitamin (MULTIVITAMIN WITH MINERALS) TABS tablet, Take 1 tablet by mouth daily. (Patient not taking: Reported on 03/28/2022), Disp: 30 tablet, Rfl: 0   QUEtiapine  (SEROQUEL) 25 MG tablet, Take 0.5 tablets (12.5 mg total) by mouth daily., Disp: 15 tablet, Rfl: 0   QUEtiapine (SEROQUEL) 25 MG tablet, Take 1 tablet (25 mg total) by mouth at bedtime. (Patient not taking: Reported on 03/28/2022), Disp: 30 tablet, Rfl: 0   Objective:     BP (!) 100/58   Pulse 95   Temp 98.2 F (36.8 C) (Temporal)   Resp 17   Ht 5\' 3"  (1.6 m)   Wt 103 lb 6 oz (46.9 kg)   SpO2 95%   BMI 18.31 kg/m  {Vitals History (Optional):23777}  Physical Exam Constitutional:      General: She is not in acute distress.    Appearance: Normal appearance. She is ill-appearing. She is not toxic-appearing or diaphoretic.  HENT:     Head: Normocephalic and atraumatic.     Right Ear: External ear normal.     Left Ear: External ear normal.  Eyes:     General: No scleral icterus.       Right eye: No discharge.        Left eye: No discharge.     Extraocular Movements: Extraocular movements intact.     Conjunctiva/sclera: Conjunctivae normal.  Pulmonary:     Effort: Pulmonary effort is normal. No respiratory distress.  Abdominal:     Tenderness: There is abdominal tenderness.  Skin:    General: Skin  is warm and dry.  Neurological:     Mental Status: She is alert and oriented to person, place, and time.  Psychiatric:        Attention and Perception: She is inattentive.        Mood and Affect: Mood normal.        Speech: Speech is delayed.        Behavior: Behavior normal.        Cognition and Memory: Cognition is impaired.      Results for orders placed or performed in visit on 03/28/22  POCT Urinalysis Dipstick  Result Value Ref Range   Color, UA yellow    Clarity, UA hazy    Glucose, UA Negative Negative   Bilirubin, UA neg    Ketones, UA neg    Spec Grav, UA 1.015 1.010 - 1.025   Blood, UA neg    pH, UA 7.0 5.0 - 8.0   Protein, UA Negative Negative   Urobilinogen, UA 0.2 0.2 or 1.0 E.U./dL   Nitrite, UA positive    Leukocytes, UA Trace (A) Negative    Appearance     Odor yes     {Labs (Optional):23779}  The 10-year ASCVD risk score (Arnett DK, et al., 2019) is: 13.8%    Assessment & Plan:   Confusion -     POCT urinalysis dipstick -     CBC -     Basic metabolic panel  Major neurocognitive disorder due to Alzheimer's disease  Abnormal finding on urinalysis -     Urine Culture -     Sulfamethoxazole-Trimethoprim; Take 1 tablet by mouth 2 (two) times daily for 3 days.  Dispense: 6 tablet; Refill: 0    Return if symptoms worsen or fail to improve.    Libby Maw, MD

## 2022-03-31 LAB — URINE CULTURE
MICRO NUMBER:: 14736735
SPECIMEN QUALITY:: ADEQUATE

## 2022-05-02 ENCOUNTER — Telehealth: Payer: Self-pay | Admitting: Family Medicine

## 2022-05-02 NOTE — Telephone Encounter (Signed)
Called patient to schedule Medicare Annual Wellness Visit (AWV). Left message for patient to call back and schedule Medicare Annual Wellness Visit (AWV).  Last date of AWV:  DUE AWVI 12/03/2009 PER PALMETTO  Please schedule an appointment at any time with [facility] .  If any questions, please contact me at [phone].  Thank you ,  Mirabelle Cyphers [facility] AWV direct phone # [phone] 

## 2022-05-09 ENCOUNTER — Emergency Department (HOSPITAL_COMMUNITY): Payer: Medicare Other

## 2022-05-09 ENCOUNTER — Inpatient Hospital Stay (HOSPITAL_COMMUNITY)
Admission: EM | Admit: 2022-05-09 | Discharge: 2022-05-25 | DRG: 391 | Disposition: A | Discharge status: Hospice | Payer: Medicare Other | Attending: Internal Medicine | Admitting: Internal Medicine

## 2022-05-09 ENCOUNTER — Encounter (HOSPITAL_COMMUNITY): Payer: Self-pay | Admitting: Emergency Medicine

## 2022-05-09 ENCOUNTER — Other Ambulatory Visit: Payer: Self-pay

## 2022-05-09 DIAGNOSIS — N3000 Acute cystitis without hematuria: Secondary | ICD-10-CM

## 2022-05-09 DIAGNOSIS — E861 Hypovolemia: Secondary | ICD-10-CM | POA: Diagnosis present

## 2022-05-09 DIAGNOSIS — Z90710 Acquired absence of both cervix and uterus: Secondary | ICD-10-CM

## 2022-05-09 DIAGNOSIS — E039 Hypothyroidism, unspecified: Secondary | ICD-10-CM | POA: Diagnosis present

## 2022-05-09 DIAGNOSIS — I959 Hypotension, unspecified: Secondary | ICD-10-CM | POA: Diagnosis not present

## 2022-05-09 DIAGNOSIS — I10 Essential (primary) hypertension: Secondary | ICD-10-CM

## 2022-05-09 DIAGNOSIS — D649 Anemia, unspecified: Secondary | ICD-10-CM | POA: Diagnosis present

## 2022-05-09 DIAGNOSIS — K5130 Ulcerative (chronic) rectosigmoiditis without complications: Secondary | ICD-10-CM

## 2022-05-09 DIAGNOSIS — K529 Noninfective gastroenteritis and colitis, unspecified: Secondary | ICD-10-CM

## 2022-05-09 DIAGNOSIS — Z66 Do not resuscitate: Secondary | ICD-10-CM | POA: Diagnosis present

## 2022-05-09 DIAGNOSIS — R579 Shock, unspecified: Secondary | ICD-10-CM | POA: Diagnosis present

## 2022-05-09 DIAGNOSIS — R54 Age-related physical debility: Secondary | ICD-10-CM | POA: Diagnosis present

## 2022-05-09 DIAGNOSIS — K51919 Ulcerative colitis, unspecified with unspecified complications: Secondary | ICD-10-CM | POA: Diagnosis not present

## 2022-05-09 DIAGNOSIS — A09 Infectious gastroenteritis and colitis, unspecified: Principal | ICD-10-CM | POA: Diagnosis present

## 2022-05-09 DIAGNOSIS — R197 Diarrhea, unspecified: Secondary | ICD-10-CM

## 2022-05-09 DIAGNOSIS — F411 Generalized anxiety disorder: Secondary | ICD-10-CM | POA: Diagnosis present

## 2022-05-09 DIAGNOSIS — K51818 Other ulcerative colitis with other complication: Secondary | ICD-10-CM | POA: Diagnosis not present

## 2022-05-09 DIAGNOSIS — F03B0 Unspecified dementia, moderate, without behavioral disturbance, psychotic disturbance, mood disturbance, and anxiety: Secondary | ICD-10-CM | POA: Diagnosis present

## 2022-05-09 DIAGNOSIS — E876 Hypokalemia: Secondary | ICD-10-CM | POA: Diagnosis present

## 2022-05-09 DIAGNOSIS — G9340 Encephalopathy, unspecified: Secondary | ICD-10-CM

## 2022-05-09 DIAGNOSIS — N390 Urinary tract infection, site not specified: Secondary | ICD-10-CM | POA: Diagnosis not present

## 2022-05-09 DIAGNOSIS — E871 Hypo-osmolality and hyponatremia: Secondary | ICD-10-CM | POA: Diagnosis present

## 2022-05-09 DIAGNOSIS — R933 Abnormal findings on diagnostic imaging of other parts of digestive tract: Secondary | ICD-10-CM | POA: Diagnosis not present

## 2022-05-09 DIAGNOSIS — F419 Anxiety disorder, unspecified: Secondary | ICD-10-CM | POA: Diagnosis not present

## 2022-05-09 DIAGNOSIS — Z87442 Personal history of urinary calculi: Secondary | ICD-10-CM

## 2022-05-09 DIAGNOSIS — E872 Acidosis, unspecified: Secondary | ICD-10-CM | POA: Diagnosis present

## 2022-05-09 DIAGNOSIS — R410 Disorientation, unspecified: Secondary | ICD-10-CM | POA: Diagnosis not present

## 2022-05-09 DIAGNOSIS — R413 Other amnesia: Secondary | ICD-10-CM | POA: Diagnosis not present

## 2022-05-09 DIAGNOSIS — N200 Calculus of kidney: Secondary | ICD-10-CM | POA: Diagnosis not present

## 2022-05-09 DIAGNOSIS — E43 Unspecified severe protein-calorie malnutrition: Secondary | ICD-10-CM | POA: Diagnosis present

## 2022-05-09 DIAGNOSIS — Z515 Encounter for palliative care: Secondary | ICD-10-CM | POA: Diagnosis not present

## 2022-05-09 DIAGNOSIS — F02C4 Dementia in other diseases classified elsewhere, severe, with anxiety: Secondary | ICD-10-CM | POA: Diagnosis present

## 2022-05-09 DIAGNOSIS — F02811 Dementia in other diseases classified elsewhere, unspecified severity, with agitation: Secondary | ICD-10-CM | POA: Diagnosis not present

## 2022-05-09 DIAGNOSIS — E7800 Pure hypercholesterolemia, unspecified: Secondary | ICD-10-CM | POA: Diagnosis present

## 2022-05-09 DIAGNOSIS — I9589 Other hypotension: Secondary | ICD-10-CM | POA: Diagnosis not present

## 2022-05-09 DIAGNOSIS — Z7401 Bed confinement status: Secondary | ICD-10-CM | POA: Diagnosis not present

## 2022-05-09 DIAGNOSIS — Z681 Body mass index (BMI) 19 or less, adult: Secondary | ICD-10-CM

## 2022-05-09 DIAGNOSIS — D519 Vitamin B12 deficiency anemia, unspecified: Secondary | ICD-10-CM | POA: Diagnosis not present

## 2022-05-09 DIAGNOSIS — G9341 Metabolic encephalopathy: Secondary | ICD-10-CM | POA: Diagnosis not present

## 2022-05-09 DIAGNOSIS — R404 Transient alteration of awareness: Secondary | ICD-10-CM | POA: Diagnosis not present

## 2022-05-09 DIAGNOSIS — B9620 Unspecified Escherichia coli [E. coli] as the cause of diseases classified elsewhere: Secondary | ICD-10-CM | POA: Diagnosis present

## 2022-05-09 DIAGNOSIS — E440 Moderate protein-calorie malnutrition: Secondary | ICD-10-CM | POA: Diagnosis not present

## 2022-05-09 DIAGNOSIS — R8271 Bacteriuria: Secondary | ICD-10-CM | POA: Diagnosis present

## 2022-05-09 DIAGNOSIS — E860 Dehydration: Secondary | ICD-10-CM | POA: Diagnosis present

## 2022-05-09 DIAGNOSIS — G309 Alzheimer's disease, unspecified: Secondary | ICD-10-CM | POA: Diagnosis present

## 2022-05-09 DIAGNOSIS — R000 Tachycardia, unspecified: Secondary | ICD-10-CM | POA: Diagnosis not present

## 2022-05-09 DIAGNOSIS — R627 Adult failure to thrive: Secondary | ICD-10-CM | POA: Diagnosis present

## 2022-05-09 DIAGNOSIS — N281 Cyst of kidney, acquired: Secondary | ICD-10-CM | POA: Diagnosis not present

## 2022-05-09 DIAGNOSIS — R571 Hypovolemic shock: Secondary | ICD-10-CM | POA: Diagnosis not present

## 2022-05-09 DIAGNOSIS — G301 Alzheimer's disease with late onset: Secondary | ICD-10-CM | POA: Diagnosis not present

## 2022-05-09 DIAGNOSIS — Z87440 Personal history of urinary (tract) infections: Secondary | ICD-10-CM

## 2022-05-09 DIAGNOSIS — I2510 Atherosclerotic heart disease of native coronary artery without angina pectoris: Secondary | ICD-10-CM | POA: Diagnosis present

## 2022-05-09 DIAGNOSIS — Z79899 Other long term (current) drug therapy: Secondary | ICD-10-CM

## 2022-05-09 DIAGNOSIS — F02C18 Dementia in other diseases classified elsewhere, severe, with other behavioral disturbance: Secondary | ICD-10-CM | POA: Diagnosis present

## 2022-05-09 DIAGNOSIS — K5190 Ulcerative colitis, unspecified, without complications: Secondary | ICD-10-CM | POA: Diagnosis present

## 2022-05-09 DIAGNOSIS — R0602 Shortness of breath: Secondary | ICD-10-CM | POA: Diagnosis not present

## 2022-05-09 LAB — URINALYSIS, ROUTINE W REFLEX MICROSCOPIC
Bilirubin Urine: NEGATIVE
Glucose, UA: NEGATIVE mg/dL
Hgb urine dipstick: NEGATIVE
Ketones, ur: NEGATIVE mg/dL
Nitrite: POSITIVE — AB
Protein, ur: NEGATIVE mg/dL
Specific Gravity, Urine: 1.012 (ref 1.005–1.030)
pH: 6 (ref 5.0–8.0)

## 2022-05-09 LAB — COMPREHENSIVE METABOLIC PANEL
ALT: 7 U/L (ref 0–44)
AST: 11 U/L — ABNORMAL LOW (ref 15–41)
Albumin: 2.3 g/dL — ABNORMAL LOW (ref 3.5–5.0)
Alkaline Phosphatase: 73 U/L (ref 38–126)
Anion gap: 12 (ref 5–15)
BUN: 7 mg/dL — ABNORMAL LOW (ref 8–23)
CO2: 25 mmol/L (ref 22–32)
Calcium: 10.1 mg/dL (ref 8.9–10.3)
Chloride: 99 mmol/L (ref 98–111)
Creatinine, Ser: 0.72 mg/dL (ref 0.44–1.00)
GFR, Estimated: >60 mL/min (ref >60)
Glucose, Bld: 85 mg/dL (ref 70–99)
Potassium: 3.3 mmol/L — ABNORMAL LOW (ref 3.5–5.1)
Sodium: 136 mmol/L (ref 135–145)
Total Bilirubin: 0.5 mg/dL (ref 0.3–1.2)
Total Protein: 6.3 g/dL — ABNORMAL LOW (ref 6.5–8.1)

## 2022-05-09 LAB — C DIFFICILE QUICK SCREEN W PCR REFLEX
C Diff antigen: NEGATIVE
C Diff interpretation: NOT DETECTED
C Diff toxin: NEGATIVE

## 2022-05-09 LAB — LACTIC ACID, PLASMA
Lactic Acid, Venous: 1.1 mmol/L (ref 0.5–1.9)
Lactic Acid, Venous: 1.4 mmol/L (ref 0.5–1.9)

## 2022-05-09 LAB — TROPONIN I (HIGH SENSITIVITY)
Troponin I (High Sensitivity): 9 ng/L (ref <18)
Troponin I (High Sensitivity): 6 ng/L (ref <18)

## 2022-05-09 LAB — CBC
HCT: 40.8 % (ref 36.0–46.0)
Hemoglobin: 12.7 g/dL (ref 12.0–15.0)
MCH: 26 pg (ref 26.0–34.0)
MCHC: 31.1 g/dL (ref 30.0–36.0)
MCV: 83.6 fL (ref 80.0–100.0)
Platelets: 419 10*3/uL — ABNORMAL HIGH (ref 150–400)
RBC: 4.88 MIL/uL (ref 3.87–5.11)
RDW: 17 % — ABNORMAL HIGH (ref 11.5–15.5)
WBC: 11.9 10*3/uL — ABNORMAL HIGH (ref 4.0–10.5)
nRBC: 0 % (ref 0.0–0.2)

## 2022-05-09 LAB — BRAIN NATRIURETIC PEPTIDE: B Natriuretic Peptide: 114.6 pg/mL — ABNORMAL HIGH (ref 0.0–100.0)

## 2022-05-09 MED ORDER — ACETAMINOPHEN 650 MG RE SUPP
650.0000 mg | Freq: Once | RECTAL | Status: AC
  Administered 2022-05-10: 650 mg via RECTAL
  Filled 2022-05-09: qty 1

## 2022-05-09 MED ORDER — MELATONIN 5 MG PO TABS: 5.0000 mg | ORAL_TABLET | Freq: Every day | ORAL | Status: DC

## 2022-05-09 MED ORDER — ACETAMINOPHEN 325 MG PO TABS
650.0000 mg | ORAL_TABLET | Freq: Four times a day (QID) | ORAL | Status: DC | PRN
  Administered 2022-05-10: 650 mg via ORAL
  Filled 2022-05-09: qty 2

## 2022-05-09 MED ORDER — LACTATED RINGERS IV BOLUS
1000.0000 mL | Freq: Once | INTRAVENOUS | Status: AC
  Administered 2022-05-09: 1000 mL via INTRAVENOUS

## 2022-05-09 MED ORDER — HALOPERIDOL LACTATE 5 MG/ML IJ SOLN
2.0000 mg | Freq: Four times a day (QID) | INTRAMUSCULAR | Status: DC | PRN
  Administered 2022-05-11 – 2022-05-12 (×2): 2 mg via INTRAVENOUS
  Filled 2022-05-09 (×2): qty 1

## 2022-05-09 MED ORDER — MESALAMINE 1.2 G PO TBEC
2.4000 g | DELAYED_RELEASE_TABLET | Freq: Every day | ORAL | Status: DC
  Administered 2022-05-10 – 2022-05-14 (×5): 2.4 g via ORAL
  Filled 2022-05-09 (×6): qty 2

## 2022-05-09 MED ORDER — IOHEXOL 350 MG/ML SOLN
75.0000 mL | Freq: Once | INTRAVENOUS | Status: AC | PRN
  Administered 2022-05-09: 75 mL via INTRAVENOUS

## 2022-05-09 MED ORDER — GERHARDT'S BUTT CREAM
1.0000 | TOPICAL_CREAM | Freq: Two times a day (BID) | CUTANEOUS | Status: DC
  Administered 2022-05-10 – 2022-05-25 (×32): 1 via TOPICAL
  Filled 2022-05-09 (×3): qty 1

## 2022-05-09 MED ORDER — LACTATED RINGERS IV BOLUS
500.0000 mL | Freq: Once | INTRAVENOUS | Status: AC
  Administered 2022-05-10: 500 mL via INTRAVENOUS

## 2022-05-09 MED ORDER — POTASSIUM CHLORIDE IN NACL 20-0.9 MEQ/L-% IV SOLN
INTRAVENOUS | Status: AC
  Filled 2022-05-09: qty 1000

## 2022-05-09 MED ORDER — QUETIAPINE FUMARATE 25 MG PO TABS
25.0000 mg | ORAL_TABLET | Freq: Every day | ORAL | Status: DC
  Administered 2022-05-10 (×2): 25 mg via ORAL
  Filled 2022-05-09 (×2): qty 1

## 2022-05-09 MED ORDER — ENOXAPARIN SODIUM 40 MG/0.4ML IJ SOSY: 40.0000 mg | PREFILLED_SYRINGE | Freq: Every day | INTRAMUSCULAR | Status: DC

## 2022-05-09 MED ORDER — SODIUM CHLORIDE 0.9 % IV SOLN
1.0000 g | Freq: Once | INTRAVENOUS | Status: AC
  Administered 2022-05-09: 1 g via INTRAVENOUS
  Filled 2022-05-09: qty 10

## 2022-05-09 MED ORDER — MEMANTINE HCL 10 MG PO TABS
5.0000 mg | ORAL_TABLET | Freq: Every day | ORAL | Status: DC
  Administered 2022-05-10: 5 mg via ORAL
  Filled 2022-05-09: qty 1

## 2022-05-09 MED ORDER — ACETAMINOPHEN 650 MG RE SUPP: 650.0000 mg | Freq: Four times a day (QID) | RECTAL | Status: DC | PRN

## 2022-05-09 MED ORDER — RISAQUAD PO CAPS
1.0000 | ORAL_CAPSULE | Freq: Three times a day (TID) | ORAL | Status: DC
  Administered 2022-05-10 – 2022-05-15 (×14): 1 via ORAL
  Filled 2022-05-09 (×16): qty 1

## 2022-05-09 NOTE — H&P (Incomplete)
PCP:   Mliss Sax, MD   Chief Complaint:  Diarrhea, weakness  HPI: This is a 77 year old female with history of ulcerative colitis, , B12 deficiency, anxiety & depression, hypothyroidism, and Alzheimer's disease w/ behavioral disturbance.  Patient has a history of recurrent UTIs.  Patient had a UTI, was treated with Bactrim.  She was fine for approximately a week.  Then developed loose watery stools that became progressively worse.  She tried to not eat and drink but became dehydrated.  GI was contacted, he recommended a stool sample.  They have had difficulty collection this.  Over the past week patient has become more confused, lethargic and had decreased aggression.  They brought her to the ER.  In the ER patient's Tmax 100, borderline blood pressure, did dip down to 77/43, tachycardia, highest HR 115, being maintained on room air.  UA with trace leukocytes, positive nitrates and rare bacteria.  No squamous cells.  Lactic acid normal.  Hospitalist asked to admit for delirium in a patient with dementia, dehydration and diarrhea.  Daughter at bedside, provided history.  Patient very confused.  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 Authorizing Provider  acetaminophen (TYLENOL) 325 MG tablet Take 650 mg by mouth every 6 (six) hours  as needed for moderate pain.   Yes [provider]  memantine (NAMENDA) 5 MG tablet Take 1 tablet (5 mg total) by mouth at bedtime. 01/05/22  Yes Marcos Eke, PA-C  mesalamine (APRISO) 0.375 g 24 hr capsule Take 1,500 mg by mouth daily.   Yes [provider]  feeding supplement (ENSURE ENLIVE / ENSURE PLUS) LIQD Take 237 mLs by mouth 2 (two) times daily between meals. Patient not taking: Reported on 03/28/2022 06/02/21   Marguerita Merles Latif, DO  HYDROcodone-acetaminophen (NORCO/VICODIN) 5-325 MG tablet Take 1 tablet by mouth every 6 (six) hours as needed for severe pain. Patient not taking: Reported on 03/28/2022 06/02/21   Marguerita Merles Latif, DO  Multiple Vitamin (MULTIVITAMIN WITH MINERALS) TABS tablet Take 1 tablet by mouth daily. Patient not taking: Reported on 03/28/2022 06/03/21   Marguerita Merles Latif, DO  Nystatin (GERHARDT'S BUTT CREAM) CREA Apply 1 Application topically 2 (two) times daily. Patient not taking: Reported on 05/09/2022 09/08/21   Mliss Sax, MD  QUEtiapine (SEROQUEL) 25 MG tablet Take 0.5 tablets (12.5 mg total) by mouth daily. 06/03/21 09/08/21  Marguerita Merles Latif, DO  QUEtiapine (SEROQUEL) 25 MG tablet Take 1 tablet (25 mg total) by mouth at bedtime. Patient not taking: Reported on 03/28/2022 06/02/21   Marguerita Merles Latif, DO    Allergies:  No Known Allergies  Social History:  reports that she has never smoked. She has never used smokeless tobacco. She reports that she does not drink  alcohol and does not use drugs.  Family History: Family History  Problem Relation Age of Onset   Kidney disease Mother     Physical Exam: Vitals:   05/09/22 2030 05/09/22 2100 05/09/22 2115 05/09/22 2304  BP: 111/67 112/62    Pulse: 95 93 95   Resp: (!) 21 (!) 23 19   Temp:    100 F (37.8 C)  TempSrc:    Axillary  SpO2: 100% 100% 100%   Weight:      Height:        General:  Alert and oriented times zero,  cachectic, frail, confused,  no acute  distress Eyes: Pink conjunctiva, no scleral icterus ENT: dry oral mucosa, neck supple, no thyromegaly Lungs: clear to ascultation, no wheeze, no crackles, no use of accessory muscles Cardiovascular: regular rate and rhythm, no regurgitation, no gallops, no murmurs. No carotid bruits, no JVD Abdomen: soft, positive BS, non-tender, non-distended, no organomegaly, not an acute abdomen GU: not examined Neuro: CN II - XII grossly intact,  Musculoskeletal: moves all extremities equally, no edema Skin: no rash, no subcutaneous crepitation, no decubitus Psych: agitated, confused female   Labs on Admission:  Recent Labs    05/09/22 1826  NA 136  K 3.3*  CL 99  CO2 25  GLUCOSE 85  BUN 7*  CREATININE 0.72  CALCIUM 10.1   Recent Labs    05/09/22 1826  AST 11*  ALT 7  ALKPHOS 73  BILITOT 0.5  PROT 6.3*  ALBUMIN 2.3*      Radiological Exams on Admission: CT Head Wo Contrast  Result Date: 05/09/2022 CLINICAL DATA:  Mental status change, unknown cause Memory loss Failure to thrive. Confusion. EXAM: CT HEAD WITHOUT CONTRAST TECHNIQUE: Contiguous axial images were obtained from the base of the skull through the vertex without intravenous contrast. RADIATION DOSE REDUCTION: This exam was performed according to the departmental dose-optimization program which includes automated exposure control, adjustment of the mA and/or kV according to patient size and/or use of iterative reconstruction technique. COMPARISON:  Head CT 08/14/2020 FINDINGS: Brain: No acute intracranial hemorrhage. No evidence of acute ischemia. Generalized atrophy. Mild periventricular chronic small vessel ischemic change. No hydrocephalus. No midline shift or mass lesion/mass effect. No subdural or extra-axial collection. Vascular: No hyperdense vessel or unexpected calcification. Skull: No fracture or focal lesion. Sinuses/Orbits: Partial opacification of the left mastoid air cells. Bilateral cataract resection. There is a  3.8 cm soft tissue lesion in the left pharyngeal space, for example series 3, image 4. This is only partially included in the field of view, but does appear to have some mild mass effect. Other: None. IMPRESSION: 1. No acute intracranial abnormality. 2. Generalized atrophy and chronic small vessel ischemic change. 3. A 3.8 cm soft tissue lesion in the left pharyngeal space is only partially included in the field of view, but does appear to have some mild mass effect. Recommend further evaluation with contrast-enhanced neck CT. This retrospectively with likely present on August 2022 head CT without gross interval change from that exam, suggesting a nonaggressive process. Given patient was administered IV contrast for concurrent abdominal CT, recommended exam is non emergent, and could be performed on an elective basis. IV contrast is highly recommended for the neck CT evaluation. Electronically Signed   By: Narda Rutherford M.D.   On: 05/09/2022 20:18   CT ABDOMEN PELVIS W CONTRAST  Result Date: 05/09/2022 CLINICAL DATA:  Acute abdominal pain. More confused than normal. Failure to thrive EXAM: CT ABDOMEN  AND PELVIS WITH CONTRAST TECHNIQUE: Multidetector CT imaging of the abdomen and pelvis was performed using the standard protocol following bolus administration of intravenous contrast. RADIATION DOSE REDUCTION: This exam was performed according to the departmental dose-optimization program which includes automated exposure control, adjustment of the mA and/or kV according to patient size and/or use of iterative reconstruction technique. CONTRAST:  75mL OMNIPAQUE IOHEXOL 350 MG/ML SOLN COMPARISON:  CT 05/30/2021 and older. FINDINGS: Lower chest: There is some linear opacity at the left lung base likely scar or atelectasis. No pleural effusion. Small pericardial effusion. The heart nonenlarged. Coronary artery calcifications are seen. Hepatobiliary: No focal liver abnormality is seen. No gallstones, gallbladder wall  thickening, or biliary dilatation. Pancreas: Unremarkable. No pancreatic ductal dilatation or surrounding inflammatory changes. Spleen: Normal in size without focal abnormality. Adrenals/Urinary Tract: Adrenal glands are preserved. Punctate nonobstructing left-sided renal stone. Bilateral Bosniak 1 and 2 cysts. No specific imaging follow-up. One of the larger foci are seen upper pole left kidney measuring 17 mm and Hounsfield units of 15. This also is unchanged from previous. Preserved contours of the urinary bladder. Stomach/Bowel: The large bowel is nondilated. There is a long segment along the left side of the colon of wall thickening, stranding and mucosal hyperenhancement this involves portions of the distal transverse colon, descending colon, rectosigmoid colon. Please correlate for colitis. The right side of the colon is nondilated with some stool. Cecum resides in the right hemipelvis. Appendix is poorly seen today. No pericecal stranding or fluid. The stomach is nondilated. Small bowel is nondilated. Vascular/Lymphatic: Aortic atherosclerosis. No enlarged abdominal or pelvic lymph nodes. Reproductive: Status post hysterectomy. No adnexal masses. Other: Patient is tilted in the scanner to the left. There is streak artifact as the arms were scanned across the abdomen. No free air or fluid collections. Musculoskeletal: Scattered degenerative changes along the spine. Trace anterolisthesis of L4 on L5. IMPRESSION: Long segment left hemicolon colitis. Stranding and mucosal hyperenhancement. No obstruction, free air or free fluid. Please correlate with clinical history. Electronically Signed   By: Karen Kays M.D.   On: 05/09/2022 20:16   DG Chest Portable 1 View  Result Date: 05/09/2022 CLINICAL DATA:  Shortness of breath EXAM: PORTABLE CHEST 1 VIEW COMPARISON:  Chest x-ray 05/29/2021 FINDINGS: The heart size and mediastinal contours are within normal limits. Both lungs are clear. The visualized skeletal  structures are unremarkable. IMPRESSION: No active disease. Electronically Signed   By: Darliss Cheney M.D.   On: 05/09/2022 19:05    Assessment/Plan Present on Admission:  Acute metabolic encephalopathy  Acute cystitis -Blood and urine cultures collected -IV Rocephin given in ER   Diarrhea/dehydration/hypotension/ -DDx: UC flare, infectious colitis, secondary to UTI, secondary to antibiotic -Stool cultures and C. difficile ordered -Probiotics initiated  -Early sepsis based on hypotension, tachycardia, low-grade temperature, infection -IV fluid hydration -Lactic acid normal -patient received 1L bolus in ER. BP improved 11/62. Continue IVF hydration   Long segment left hemicolon colitis  -h/o Ulcerative colitis without complications (HCC) - Zosyn IV initiated for infectious colitis.  Cultures pending -Abdomen benign -Defer to a.m. team GI consult , ? if treatment for UC needed   Moderate dementia w/ behavioral disturbances -Seroquel initiated QHS PRN -As needed Haldol   Protein-calorie malnutrition, severe (HCC)/debility -Nutrition consult -PT consult placed -Prealbumin level ordered  Iliya Spivack 05/09/2022, 11:33 PM

## 2022-05-09 NOTE — ED Provider Notes (Signed)
[place] EMERGENCY DEPARTMENT AT [facility] Provider Note   CSN: [id number] Arrival date & time: 05/09/22  1750     History  Chief Complaint  Patient presents with   Failure To Thrive    Cynthia Perry is a 77 y.o. female with medical history of elevated cholesterol, generalized abdominal pain, generalized anxiety disorder, hypercalcemia, ulcerative colitis.  Patient presents to ED for evaluation.  The patient daughter is present, she provides some history.  Per patient daughter, patient was diagnosed with UTI 1 month ago.  The patient was placed on antibiotics, Bactrim, and due to this she developed a "ulcerative colitis flare" per her daughter.  The patient was advised by her GI doctor, Dr. Phillips Climes, to begin taking probiotic which she began taking 1 week ago.  The patient daughter reports that the patient began complaining of "not being able to breathe" to the patient husband today prompting EMS to be called and the patient to be brought in for evaluation.  The patient daughter reports that the patient has not been eating or drinking for the last week due to for diarrhea.  The patient daughter states that at 1 point there was some mucus and blood mixed into her diarrhea however none since then.  Dr. Phillips Climes send patient home with stool panel to rule out C. difficile.  Patient daughter has not been able to collect a sample yet.  The patient daughter denies any fevers at home, nausea, vomiting.  Patient daughter reports the patient did take her antibiotics to completion.  The patient is alert and oriented x 0 at baseline.  The patient daughter reports that the patient has been more agitated recently which is not her baseline mental status.  HPI     Home Medications Prior to Admission medications   Medication Sig Start Date End Date Taking? Authorizing Provider  acetaminophen (TYLENOL) 325 MG tablet Take 650 mg by mouth every 6 (six) hours as needed for moderate pain.   Yes  [provider]  memantine (NAMENDA) 5 MG tablet Take 1 tablet (5 mg total) by mouth at bedtime. 01/05/22  Yes Marcos Eke, PA-C  mesalamine (APRISO) 0.375 g 24 hr capsule Take 1,500 mg by mouth daily.   Yes [provider]  feeding supplement (ENSURE ENLIVE / ENSURE PLUS) LIQD Take 237 mLs by mouth 2 (two) times daily between meals. Patient not taking: Reported on 03/28/2022 06/02/21   Marguerita Merles Latif, DO  HYDROcodone-acetaminophen (NORCO/VICODIN) 5-325 MG tablet Take 1 tablet by mouth every 6 (six) hours as needed for severe pain. Patient not taking: Reported on 03/28/2022 06/02/21   Marguerita Merles Latif, DO  Multiple Vitamin (MULTIVITAMIN WITH MINERALS) TABS tablet Take 1 tablet by mouth daily. Patient not taking: Reported on 03/28/2022 06/03/21   Marguerita Merles Latif, DO  Nystatin (GERHARDT'S BUTT CREAM) CREA Apply 1 Application topically 2 (two) times daily. Patient not taking: Reported on 05/09/2022 09/08/21   Mliss Sax, MD  QUEtiapine (SEROQUEL) 25 MG tablet Take 0.5 tablets (12.5 mg total) by mouth daily. 06/03/21 09/08/21  Marguerita Merles Latif, DO  QUEtiapine (SEROQUEL) 25 MG tablet Take 1 tablet (25 mg total) by mouth at bedtime. Patient not taking: Reported on 03/28/2022 06/02/21   Merlene Laughter, DO      Allergies    Patient has no known allergies.    Review of Systems   Review of Systems  Unable to perform ROS: Dementia (Level 5 caveat)  All other systems reviewed and  are negative.   Physical Exam Updated Vital Signs BP 112/62   Pulse 95   Temp 100 F (37.8 C) (Axillary)   Resp 19   Ht 5\' 3"  (1.6 m)   Wt 46.9 kg   SpO2 100%   BMI 18.32 kg/m  Physical Exam Vitals and nursing note reviewed.  Constitutional:      Appearance: She is well-developed.  HENT:     Head: Normocephalic and atraumatic.     Mouth/Throat:     Mouth: Mucous membranes are dry.  Eyes:     Conjunctiva/sclera: Conjunctivae normal.  Cardiovascular:     Rate and Rhythm:  Normal rate and regular rhythm.     Heart sounds: No murmur heard. Pulmonary:     Effort: Pulmonary effort is normal. No respiratory distress.     Breath sounds: Normal breath sounds.  Abdominal:     Palpations: Abdomen is soft.     Tenderness: There is no abdominal tenderness.  Musculoskeletal:        General: No swelling.     Cervical back: Neck supple.  Skin:    General: Skin is warm and dry.     Capillary Refill: Capillary refill takes less than 2 seconds.  Neurological:     Mental Status: She is alert. She is disoriented.     ED Results / Procedures / Treatments   Labs (all labs ordered are listed, but only abnormal results are displayed) Labs Reviewed  CBC - Abnormal; Notable for the following components:      Result Value   WBC 11.9 (*)    RDW 17.0 (*)    Platelets 419 (*)    All other components within normal limits  COMPREHENSIVE METABOLIC PANEL - Abnormal; Notable for the following components:   Potassium 3.3 (*)    BUN 7 (*)    Total Protein 6.3 (*)    Albumin 2.3 (*)    AST 11 (*)    All other components within normal limits  URINALYSIS, ROUTINE W REFLEX MICROSCOPIC - Abnormal; Notable for the following components:   APPearance HAZY (*)    Nitrite POSITIVE (*)    Leukocytes,Ua TRACE (*)    Bacteria, UA RARE (*)    All other components within normal limits  BRAIN NATRIURETIC PEPTIDE - Abnormal; Notable for the following components:   B Natriuretic Peptide 114.6 (*)    All other components within normal limits  CULTURE, BLOOD (ROUTINE X 2)  CULTURE, BLOOD (ROUTINE X 2)  GASTROINTESTINAL PANEL BY PCR, STOOL (REPLACES STOOL CULTURE)  URINE CULTURE  C DIFFICILE QUICK SCREEN W PCR REFLEX    LACTIC ACID, PLASMA  LACTIC ACID, PLASMA  TROPONIN I (HIGH SENSITIVITY)  TROPONIN I (HIGH SENSITIVITY)    EKG EKG Interpretation  Date/Time:  Monday May 09 2022 18:25:37 EDT Ventricular Rate:  104 PR Interval:  146 QRS Duration: 78 QT Interval:  314 QTC  Calculation: 413 R Axis:   -40 Text Interpretation: Sinus tachycardia Left anterior fascicular block Low voltage, precordial leads Abnormal R-wave progression, early transition No significant change since last tracing Confirmed by Alvira Monday ([id number]) on 05/09/2022 7:27:07 PM  Radiology CT Head Wo Contrast  Result Date: 05/09/2022 CLINICAL DATA:  Mental status change, unknown cause Memory loss Failure to thrive. Confusion. EXAM: CT HEAD WITHOUT CONTRAST TECHNIQUE: Contiguous axial images were obtained from the base of the skull through the vertex without intravenous contrast. RADIATION DOSE REDUCTION: This exam was performed according to the departmental dose-optimization program which includes  automated exposure control, adjustment of the mA and/or kV according to patient size and/or use of iterative reconstruction technique. COMPARISON:  Head CT 08/14/2020 FINDINGS: Brain: No acute intracranial hemorrhage. No evidence of acute ischemia. Generalized atrophy. Mild periventricular chronic small vessel ischemic change. No hydrocephalus. No midline shift or mass lesion/mass effect. No subdural or extra-axial collection. Vascular: No hyperdense vessel or unexpected calcification. Skull: No fracture or focal lesion. Sinuses/Orbits: Partial opacification of the left mastoid air cells. Bilateral cataract resection. There is a 3.8 cm soft tissue lesion in the left pharyngeal space, for example series 3, image 4. This is only partially included in the field of view, but does appear to have some mild mass effect. Other: None. IMPRESSION: 1. No acute intracranial abnormality. 2. Generalized atrophy and chronic small vessel ischemic change. 3. A 3.8 cm soft tissue lesion in the left pharyngeal space is only partially included in the field of view, but does appear to have some mild mass effect. Recommend further evaluation with contrast-enhanced neck CT. This retrospectively with likely present on August 2022 head CT  without gross interval change from that exam, suggesting a nonaggressive process. Given patient was administered IV contrast for concurrent abdominal CT, recommended exam is non emergent, and could be performed on an elective basis. IV contrast is highly recommended for the neck CT evaluation. Electronically Signed   By: Narda Rutherford M.D.   On: 05/09/2022 20:18   CT ABDOMEN PELVIS W CONTRAST  Result Date: 05/09/2022 CLINICAL DATA:  Acute abdominal pain. More confused than normal. Failure to thrive EXAM: CT ABDOMEN AND PELVIS WITH CONTRAST TECHNIQUE: Multidetector CT imaging of the abdomen and pelvis was performed using the standard protocol following bolus administration of intravenous contrast. RADIATION DOSE REDUCTION: This exam was performed according to the departmental dose-optimization program which includes automated exposure control, adjustment of the mA and/or kV according to patient size and/or use of iterative reconstruction technique. CONTRAST:  75mL OMNIPAQUE IOHEXOL 350 MG/ML SOLN COMPARISON:  CT 05/30/2021 and older. FINDINGS: Lower chest: There is some linear opacity at the left lung base likely scar or atelectasis. No pleural effusion. Small pericardial effusion. The heart nonenlarged. Coronary artery calcifications are seen. Hepatobiliary: No focal liver abnormality is seen. No gallstones, gallbladder wall thickening, or biliary dilatation. Pancreas: Unremarkable. No pancreatic ductal dilatation or surrounding inflammatory changes. Spleen: Normal in size without focal abnormality. Adrenals/Urinary Tract: Adrenal glands are preserved. Punctate nonobstructing left-sided renal stone. Bilateral Bosniak 1 and 2 cysts. No specific imaging follow-up. One of the larger foci are seen upper pole left kidney measuring 17 mm and Hounsfield units of 15. This also is unchanged from previous. Preserved contours of the urinary bladder. Stomach/Bowel: The large bowel is nondilated. There is a long segment  along the left side of the colon of wall thickening, stranding and mucosal hyperenhancement this involves portions of the distal transverse colon, descending colon, rectosigmoid colon. Please correlate for colitis. The right side of the colon is nondilated with some stool. Cecum resides in the right hemipelvis. Appendix is poorly seen today. No pericecal stranding or fluid. The stomach is nondilated. Small bowel is nondilated. Vascular/Lymphatic: Aortic atherosclerosis. No enlarged abdominal or pelvic lymph nodes. Reproductive: Status post hysterectomy. No adnexal masses. Other: Patient is tilted in the scanner to the left. There is streak artifact as the arms were scanned across the abdomen. No free air or fluid collections. Musculoskeletal: Scattered degenerative changes along the spine. Trace anterolisthesis of L4 on L5. IMPRESSION: Long segment left hemicolon colitis.  Stranding and mucosal hyperenhancement. No obstruction, free air or free fluid. Please correlate with clinical history. Electronically Signed   By: Karen Kays M.D.   On: 05/09/2022 20:16   DG Chest Portable 1 View  Result Date: 05/09/2022 CLINICAL DATA:  Shortness of breath EXAM: PORTABLE CHEST 1 VIEW COMPARISON:  Chest x-ray 05/29/2021 FINDINGS: The heart size and mediastinal contours are within normal limits. Both lungs are clear. The visualized skeletal structures are unremarkable. IMPRESSION: No active disease. Electronically Signed   By: Darliss Cheney M.D.   On: 05/09/2022 19:05    Procedures Procedures   Medications Ordered in ED Medications  acetaminophen (TYLENOL) suppository 650 mg (has no administration in time range)  lactated ringers bolus 1,000 mL (0 mLs Intravenous Stopped 05/09/22 2145)  cefTRIAXone (ROCEPHIN) 1 g in sodium chloride 0.9 % 100 mL IVPB (0 g Intravenous Stopped 05/09/22 2145)  iohexol (OMNIPAQUE) 350 MG/ML injection 75 mL (75 mLs Intravenous Contrast Given 05/09/22 2000)    ED Course/ Medical Decision  Making/ A&P   Medical Decision Making Amount and/or Complexity of Data Reviewed Labs: ordered. Radiology: ordered.   77 year old female presents to ED for evaluation.  Please see HPI for further details.  On my examination the patient is disoriented.  She has no abdominal tenderness on exam.  She has dry mucous membranes.  The room smells of UTI.   CBC with leukocytosis to 11.9, no anemia.  CMP with potassium 3.3, no elevated creatinine.  Urinalysis with nitrite positive urine.  Treated with 1 g ceftriaxone.  Lactic acid 1.4, troponin 6.  BNP elevated 114.6.  Will collect C. difficile screen, gastrointestinal panel.  Patient given 1 L fluid.  CT head unremarkable.  CT abdomen pelvis shows segmental colitis.  Chest x-ray unremarkable.  Dr. Dalene Seltzer, attending, has directed me to admit this patient for encephalopathy secondary to UTI colitis.  Patient has been admitted at this time by Dr. Joneen Roach.  Final Clinical Impression(s) / ED Diagnoses Final diagnoses:  Encephalopathy  Failure to thrive in adult  Colitis    Rx / DC Orders ED Discharge Orders     None         Al Decant, PA-C 05/09/22 2335    Alvira Monday, MD 05/10/22 1510

## 2022-05-09 NOTE — ED Triage Notes (Signed)
Pt BIBA from home. Pt c/o failure to thrive. Daughter states pt has been more confused than normal. Pt is a&ox 0 at baseline. Pt is saying she's going to die.

## 2022-05-10 ENCOUNTER — Other Ambulatory Visit: Payer: Self-pay

## 2022-05-10 DIAGNOSIS — E876 Hypokalemia: Secondary | ICD-10-CM | POA: Diagnosis not present

## 2022-05-10 DIAGNOSIS — A09 Infectious gastroenteritis and colitis, unspecified: Principal | ICD-10-CM

## 2022-05-10 DIAGNOSIS — E861 Hypovolemia: Secondary | ICD-10-CM | POA: Diagnosis not present

## 2022-05-10 DIAGNOSIS — R579 Shock, unspecified: Secondary | ICD-10-CM | POA: Diagnosis present

## 2022-05-10 DIAGNOSIS — G9341 Metabolic encephalopathy: Secondary | ICD-10-CM | POA: Diagnosis not present

## 2022-05-10 LAB — GASTROINTESTINAL PANEL BY PCR, STOOL (REPLACES STOOL CULTURE)

## 2022-05-10 LAB — LACTIC ACID, PLASMA
Lactic Acid, Venous: 2.2 mmol/L (ref 0.5–1.9)
Lactic Acid, Venous: 1.6 mmol/L (ref 0.5–1.9)

## 2022-05-10 LAB — BASIC METABOLIC PANEL
Anion gap: 6 (ref 5–15)
Anion gap: 8 (ref 5–15)
BUN: <5 mg/dL — ABNORMAL LOW (ref 8–23)
BUN: 6 mg/dL — ABNORMAL LOW (ref 8–23)
CO2: 24 mmol/L (ref 22–32)
CO2: 24 mmol/L (ref 22–32)
Calcium: 9 mg/dL (ref 8.9–10.3)
Calcium: 9.6 mg/dL (ref 8.9–10.3)
Chloride: 103 mmol/L (ref 98–111)
Chloride: 107 mmol/L (ref 98–111)
Creatinine, Ser: 0.56 mg/dL (ref 0.44–1.00)
Creatinine, Ser: 0.5 mg/dL (ref 0.44–1.00)
GFR, Estimated: >60 mL/min (ref >60)
GFR, Estimated: >60 mL/min (ref >60)
Glucose, Bld: 83 mg/dL (ref 70–99)
Glucose, Bld: 188 mg/dL — ABNORMAL HIGH (ref 70–99)
Potassium: 2.7 mmol/L — CL (ref 3.5–5.1)
Potassium: 3.5 mmol/L (ref 3.5–5.1)
Sodium: 133 mmol/L — ABNORMAL LOW (ref 135–145)
Sodium: 139 mmol/L (ref 135–145)

## 2022-05-10 LAB — CBC WITH DIFFERENTIAL/PLATELET
Abs Immature Granulocytes: 0.02 10*3/uL (ref 0.00–0.07)
Basophils Absolute: 0 10*3/uL (ref 0.0–0.1)
Basophils Relative: 0 %
Eosinophils Absolute: 0 10*3/uL (ref 0.0–0.5)
Eosinophils Relative: 0 %
HCT: 31.5 % — ABNORMAL LOW (ref 36.0–46.0)
Hemoglobin: 10.2 g/dL — ABNORMAL LOW (ref 12.0–15.0)
Immature Granulocytes: 0 %
Lymphocytes Relative: 5 %
Lymphs Abs: 0.4 10*3/uL — ABNORMAL LOW (ref 0.7–4.0)
MCH: 26.4 pg (ref 26.0–34.0)
MCHC: 32.4 g/dL (ref 30.0–36.0)
MCV: 81.6 fL (ref 80.0–100.0)
Monocytes Absolute: 0.7 10*3/uL (ref 0.1–1.0)
Monocytes Relative: 9 %
Neutro Abs: 6.7 10*3/uL (ref 1.7–7.7)
Neutrophils Relative %: 86 %
Platelets: 302 10*3/uL (ref 150–400)
RBC: 3.86 MIL/uL — ABNORMAL LOW (ref 3.87–5.11)
RDW: 16.8 % — ABNORMAL HIGH (ref 11.5–15.5)
WBC: 7.8 10*3/uL (ref 4.0–10.5)
nRBC: 0 % (ref 0.0–0.2)

## 2022-05-10 LAB — GLUCOSE, CAPILLARY
Glucose-Capillary: 78 mg/dL (ref 70–99)
Glucose-Capillary: 109 mg/dL — ABNORMAL HIGH (ref 70–99)
Glucose-Capillary: 149 mg/dL — ABNORMAL HIGH (ref 70–99)
Glucose-Capillary: 224 mg/dL — ABNORMAL HIGH (ref 70–99)
Glucose-Capillary: 167 mg/dL — ABNORMAL HIGH (ref 70–99)

## 2022-05-10 LAB — CULTURE, BLOOD (ROUTINE X 2)

## 2022-05-10 LAB — MRSA NEXT GEN BY PCR, NASAL: MRSA by PCR Next Gen: NOT DETECTED

## 2022-05-10 LAB — PROCALCITONIN: Procalcitonin: 0.13 ng/mL

## 2022-05-10 MED ORDER — LACTATED RINGERS IV BOLUS
500.0000 mL | Freq: Once | INTRAVENOUS | Status: AC
  Administered 2022-05-10: 500 mL via INTRAVENOUS

## 2022-05-10 MED ORDER — POTASSIUM CHLORIDE 10 MEQ/100ML IV SOLN: 10.0000 meq | INTRAVENOUS | Status: DC

## 2022-05-10 MED ORDER — MAGNESIUM SULFATE 2 GM/50ML IV SOLN
2.0000 g | Freq: Once | INTRAVENOUS | Status: AC
  Administered 2022-05-10: 2 g via INTRAVENOUS

## 2022-05-10 MED ORDER — HEPARIN SODIUM (PORCINE) 5000 UNIT/ML IJ SOLN: 5000.0000 [IU] | Freq: Three times a day (TID) | INTRAMUSCULAR | Status: DC

## 2022-05-10 MED ORDER — SODIUM CHLORIDE 0.9 % IV SOLN
2.0000 g | INTRAVENOUS | Status: DC
  Administered 2022-05-10: 2 g via INTRAVENOUS
  Filled 2022-05-10 (×2): qty 20

## 2022-05-10 MED ORDER — ORAL CARE MOUTH RINSE: 15.0000 mL | OROMUCOSAL | Status: DC | PRN

## 2022-05-10 MED ORDER — DOCUSATE SODIUM 100 MG PO CAPS: 100.0000 mg | ORAL_CAPSULE | Freq: Two times a day (BID) | ORAL | Status: DC | PRN

## 2022-05-10 MED ORDER — NOREPINEPHRINE 4 MG/250ML-% IV SOLN
2.0000 ug/min | INTRAVENOUS | Status: DC
  Administered 2022-05-10: 4 ug/min via INTRAVENOUS
  Administered 2022-05-10: 2 ug/min via INTRAVENOUS
  Filled 2022-05-10 (×2): qty 250

## 2022-05-10 MED ORDER — POTASSIUM CHLORIDE 20 MEQ PO PACK: 40.0000 meq | PACK | ORAL | Status: DC

## 2022-05-10 MED ORDER — SODIUM CHLORIDE 0.9% FLUSH
10.0000 mL | INTRAVENOUS | Status: DC | PRN
  Administered 2022-05-10: 10 mL

## 2022-05-10 MED ORDER — SODIUM CHLORIDE 0.9 % IV SOLN: 1.0000 g | Freq: Once | INTRAVENOUS | Status: DC

## 2022-05-10 MED ORDER — MEMANTINE HCL 10 MG PO TABS
5.0000 mg | ORAL_TABLET | Freq: Every day | ORAL | Status: DC
  Administered 2022-05-11 – 2022-05-25 (×15): 5 mg via ORAL
  Filled 2022-05-10 (×15): qty 1

## 2022-05-10 MED ORDER — HEPARIN SODIUM (PORCINE) 5000 UNIT/ML IJ SOLN
5000.0000 [IU] | Freq: Two times a day (BID) | INTRAMUSCULAR | Status: DC
  Administered 2022-05-10 – 2022-05-25 (×31): 5000 [IU] via SUBCUTANEOUS
  Filled 2022-05-10 (×31): qty 1

## 2022-05-10 MED ORDER — SODIUM CHLORIDE 0.9% FLUSH
10.0000 mL | Freq: Two times a day (BID) | INTRAVENOUS | Status: DC
  Administered 2022-05-10 – 2022-05-15 (×10): 10 mL
  Administered 2022-05-16: 20 mL
  Administered 2022-05-17 – 2022-05-20 (×6): 10 mL

## 2022-05-10 MED ORDER — HYDROCORTISONE SOD SUC (PF) 100 MG IJ SOLR
100.0000 mg | Freq: Three times a day (TID) | INTRAMUSCULAR | Status: DC
  Administered 2022-05-10 – 2022-05-11 (×4): 100 mg via INTRAVENOUS
  Filled 2022-05-10 (×5): qty 2

## 2022-05-10 MED ORDER — LACTATED RINGERS IV BOLUS
1000.0000 mL | Freq: Once | INTRAVENOUS | Status: AC
  Administered 2022-05-10: 1000 mL via INTRAVENOUS

## 2022-05-10 MED ORDER — SODIUM CHLORIDE 0.9 % IV SOLN: 1.0000 g | INTRAVENOUS | Status: DC

## 2022-05-10 MED ORDER — CHLORHEXIDINE GLUCONATE CLOTH 2 % EX PADS
6.0000 | MEDICATED_PAD | Freq: Every day | CUTANEOUS | Status: DC
  Administered 2022-05-10 – 2022-05-21 (×12): 6 via TOPICAL

## 2022-05-10 MED ORDER — POTASSIUM CHLORIDE 20 MEQ PO PACK
60.0000 meq | PACK | Freq: Once | ORAL | Status: AC
  Administered 2022-05-10: 60 meq via ORAL
  Filled 2022-05-10: qty 3

## 2022-05-10 MED ORDER — POTASSIUM CHLORIDE 10 MEQ/100ML IV SOLN
10.0000 meq | INTRAVENOUS | Status: AC
  Administered 2022-05-10 (×6): 10 meq via INTRAVENOUS
  Filled 2022-05-10 (×6): qty 100

## 2022-05-10 MED ORDER — PIPERACILLIN-TAZOBACTAM 3.375 G IVPB
3.3750 g | Freq: Three times a day (TID) | INTRAVENOUS | Status: DC
  Administered 2022-05-10: 3.375 g via INTRAVENOUS
  Filled 2022-05-10: qty 50

## 2022-05-10 MED ORDER — METRONIDAZOLE 500 MG/100ML IV SOLN
500.0000 mg | Freq: Two times a day (BID) | INTRAVENOUS | Status: DC
  Administered 2022-05-10 (×2): 500 mg via INTRAVENOUS
  Filled 2022-05-10 (×2): qty 100

## 2022-05-10 MED ORDER — ALBUMIN HUMAN 5 % IV SOLN
25.0000 g | Freq: Once | INTRAVENOUS | Status: AC
  Administered 2022-05-10: 25 g via INTRAVENOUS
  Filled 2022-05-10: qty 500

## 2022-05-10 MED ORDER — POLYETHYLENE GLYCOL 3350 17 G PO PACK: 17.0000 g | PACK | Freq: Every day | ORAL | Status: DC | PRN

## 2022-05-10 MED ORDER — MAGNESIUM SULFATE 2 GM/50ML IV SOLN
INTRAVENOUS | Status: AC
  Filled 2022-05-10: qty 50

## 2022-05-10 MED ORDER — SODIUM CHLORIDE 0.9 % IV SOLN
250.0000 mL | INTRAVENOUS | Status: DC
  Administered 2022-05-10 – 2022-05-11 (×3): 250 mL via INTRAVENOUS

## 2022-05-10 NOTE — Consult Note (Signed)
NAME:  Cynthia Perry, MRN:  [id number], DOB:  08/03/1945, LOS: 1 ADMISSION DATE:  05/09/2022, CONSULTATION DATE:  5/7 REFERRING MD:  Othella Boyer, CHIEF COMPLAINT:  hypotension   History of Present Illness:  Cynthia Perry is a 77 y/o woman with a history of UC on mesalamine who presented with poor PO intake, diarrhea, SOB, increased confusion. She has had some blood in her diarrhea, which has been going on for about 1 month since she took Bactrim for a UTI .  She has continued to take probiotics per her GI doctors recommendations.  She has not collected a stool sample as prescribed to rule out C. difficile.  Shortness of breath began today.  She was admitted by the hospitalist overnight.  CT demonstrated left hemicolon inflammation.  She was started on Zosyn and recently on steroids due to concern for UC flare.  Overnight she developed progressive hypotension despite 3 L of volume resuscitation; most recent blood pressure 71/56.  PCCM was consulted for management.  No family at bedside this morning. She is unable to provide additional history.  Pertinent  Medical History  Alzheimer's UC HLD Severe malnutrition GAD  Significant Hospital Events: Including procedures, antibiotic start and stop dates in addition to other pertinent events   5/6 admission for diarrhea  Interim History / Subjective:    Objective   Blood pressure (!) 71/42, pulse 78, temperature 100 F (37.8 C), temperature source Axillary, resp. rate 15, height 5\' 3"  (1.6 m), weight 46.9 kg, SpO2 100 %.        Intake/Output Summary (Last 24 hours) at 05/10/2022 4098 Last data filed at 05/10/2022 0607 Gross per 24 hour  Intake 2050 ml  Output --  Net 2050 ml   Filed Weights   05/09/22 1822  Weight: 46.9 kg    Examination: General: frail, elderly woman lying in bed in NAD, shaking under multiple blankets HENT: [place]/AT, eyes anicteric Lungs: breathing comfortably on , CTA Cardiovascular: S1S2, RRR Abdomen: soft, NT; recent  episode of diarrhea Extremities: no peripheral edema, no cyanosis Neuro: Confused, not answering any questions. Shouting out to multiple family members. Not following commands Derm: thin skin, no diaphoresis  Sodium 133 Potassium 2.7 BUN less than 5 Creatinine 0.56 WBC 7.8 H/H 10.2/31.5 Platelets 302 UA 0-5 WBCs, trace leukocytes, positive nitrites Lactic acid 1.1> 1.4  CT abdomen pelvis personally reviewed-minimal soft tissue mass. Bilateral renal cysts.  Solid stool in rectum. Inflammation in left colon.   Resolved Hospital Problem list     Assessment & Plan:  Hypotension, concern for hypovolemic vs septic shock -confirmed with family that they would want vasopressors -albumin, additional IVF bolus, peripheral pressors -con't antibiotics; adding flagyl to also cover C diff -c diff pending -empiric steroids for UC -mesalamine  Acute encephalopathy with baseline Alzheimer's  -supportive care -hold home seroquel, namenda for now  Hypokalemia 2/2 diarrhea -IV repletion ordered  Hypovolemic hyponatremia -con't NS maintenance for now  Acute anemia -monitor for bleeding -transfuse for Hb <7 or hemodynamically significant bleeding   Best Practice (right click and "Reselect all SmartList Selections" daily)   Diet/type: NPO DVT prophylaxis: prophylactic heparin  GI prophylaxis: N/A Lines: N/A Foley:  N/A Code Status:  DNR Last date of multidisciplinary goals of care discussion [daughter updated by PA Reece on 5/7]  Labs   CBC: Recent Labs  Lab 05/09/22 1826 05/10/22 0244  WBC 11.9* 7.8  NEUTROABS  --  6.7  HGB 12.7 10.2*  HCT 40.8 31.5*  MCV 83.6 81.6  PLT 419* 302    Basic Metabolic Panel: Recent Labs  Lab 05/09/22 1826 05/10/22 0244  NA 136 133*  K 3.3* 2.7*  CL 99 103  CO2 25 24  GLUCOSE 85 83  BUN 7* <5*  CREATININE 0.72 0.56  CALCIUM 10.1 9.0   GFR: Estimated Creatinine Clearance: 42.9 mL/min (by C-G formula based on SCr of 0.56  mg/dL). Recent Labs  Lab 05/09/22 1826 05/09/22 1845 05/09/22 2046 05/10/22 0244  WBC 11.9*  --   --  7.8  LATICACIDVEN  --  1.1 1.4  --     Liver Function Tests: Recent Labs  Lab 05/09/22 1826  AST 11*  ALT 7  ALKPHOS 73  BILITOT 0.5  PROT 6.3*  ALBUMIN 2.3*   No results for input(s): "LIPASE", "AMYLASE" in the last 168 hours. No results for input(s): "AMMONIA" in the last 168 hours.  ABG No results found for: "PHART", "PCO2ART", "PO2ART", "HCO3", "TCO2", "ACIDBASEDEF", "O2SAT"   Coagulation Profile: No results for input(s): "INR", "PROTIME" in the last 168 hours.  Cardiac Enzymes: No results for input(s): "CKTOTAL", "CKMB", "CKMBINDEX", "TROPONINI" in the last 168 hours.  HbA1C: No results found for: "HGBA1C"  CBG: No results for input(s): "GLUCAP" in the last 168 hours.  Review of Systems:   Unable to be obtained due to inability to provide accurate history  Past Medical History:  She,  has a past medical history of Abnormal urinalysis (07/23/2020), Debilitated, Elevated cholesterol, Generalized abdominal pain, Generalized anxiety disorder, History of ASCVD, Hypercalcemia, Hypercholesteremia, Hypotension due to drugs, Kidney stones, Left sided ulcerative colitis, Left upper quadrant abdominal mass, Major neurocognitive disorder due to Alzheimer's disease (12/30/2020), Tachycardia, and Vitamin B12 deficiency.   Surgical History:   Past Surgical History:  Procedure Laterality Date   ABDOMINAL HYSTERECTOMY       Social History:   reports that she has never smoked. She has never used smokeless tobacco. She reports that she does not drink alcohol and does not use drugs.   Family History:  Her family history includes Kidney disease in her mother.   Allergies No Known Allergies   Home Medications  Prior to Admission medications   Medication Sig Start Date End Date Taking? Authorizing Provider  acetaminophen (TYLENOL) 325 MG tablet Take 650 mg by mouth  every 6 (six) hours as needed for moderate pain.   Yes [provider]  memantine (NAMENDA) 5 MG tablet Take 1 tablet (5 mg total) by mouth at bedtime. 01/05/22  Yes Marcos Eke, PA-C  mesalamine (APRISO) 0.375 g 24 hr capsule Take 1,500 mg by mouth daily.   Yes [provider]  feeding supplement (ENSURE ENLIVE / ENSURE PLUS) LIQD Take 237 mLs by mouth 2 (two) times daily between meals. Patient not taking: Reported on 03/28/2022 06/02/21   Marguerita Merles Latif, DO  HYDROcodone-acetaminophen (NORCO/VICODIN) 5-325 MG tablet Take 1 tablet by mouth every 6 (six) hours as needed for severe pain. Patient not taking: Reported on 03/28/2022 06/02/21   Marguerita Merles Latif, DO  Multiple Vitamin (MULTIVITAMIN WITH MINERALS) TABS tablet Take 1 tablet by mouth daily. Patient not taking: Reported on 03/28/2022 06/03/21   Marguerita Merles Latif, DO  Nystatin (GERHARDT'S BUTT CREAM) CREA Apply 1 Application topically 2 (two) times daily. Patient not taking: Reported on 05/09/2022 09/08/21   Mliss Sax, MD  QUEtiapine (SEROQUEL) 25 MG tablet Take 0.5 tablets (12.5 mg total) by mouth daily. 06/03/21 09/08/21  Marguerita Merles Latif, DO  QUEtiapine (SEROQUEL) 25 MG tablet  Take 1 tablet (25 mg total) by mouth at bedtime. Patient not taking: Reported on 03/28/2022 06/02/21   Merlene Laughter, DO     Critical care time: 38 min.      Steffanie Dunn, DO 05/10/22 7:09 AM [facility]  For contact information, see Amion. If no response to pager, please call PCCM consult pager. After hours, 7PM- 7AM, please call Elink.

## 2022-05-10 NOTE — ED Notes (Signed)
Family leaving bedside, posey alarm placed on pt.

## 2022-05-10 NOTE — Progress Notes (Signed)
Peripherally Inserted Central Catheter Placement  The IV Nurse has discussed with the patient and/or persons authorized to consent for the patient, the purpose of this procedure and the potential benefits and risks involved with this procedure.  The benefits include less needle sticks, lab draws from the catheter, and the patient may be discharged home with the catheter. Risks include, but not limited to, infection, bleeding, blood clot (thrombus formation), and puncture of an artery; nerve damage and irregular heartbeat and possibility to perform a PICC exchange if needed/ordered by physician.  Alternatives to this procedure were also discussed.  Bard Power PICC patient education guide, fact sheet on infection prevention and patient information card has been provided to patient /or left at bedside.    PICC Placement Documentation  PICC Double Lumen 05/10/22 Right Brachial 33 cm 0 cm (Active)  Indication for Insertion or Continuance of Line Vasoactive infusions 05/10/22 1306  Exposed Catheter (cm) 0 cm 05/10/22 1306  Site Assessment Clean, Dry, Intact 05/10/22 1306  Lumen #1 Status Flushed;Saline locked;Blood return noted 05/10/22 1306  Lumen #2 Status Flushed;Saline locked;Blood return noted 05/10/22 1306  Dressing Type Transparent;Securing device 05/10/22 1306  Dressing Status Antimicrobial disc in place;Clean, Dry, Intact 05/10/22 1306  Safety Lock Intact 05/10/22 1306  Line Adjustment (NICU/IV Team Only) No 05/10/22 1306  Dressing Intervention New dressing;Other (Comment) 05/10/22 1306  Dressing Change Due 05/17/22 05/10/22 1306   Consent signed by Son at bedside with daughter on the phone agree with placing the PICC line    Maximino Greenland 05/10/2022, 1:08 PM

## 2022-05-10 NOTE — ED Notes (Addendum)
Pt K+ 2.4 Dr D. Crosley updated, new orders placed

## 2022-05-10 NOTE — Progress Notes (Signed)
IVT consult placed for USGPIV for pressors. Patient is not currently receiving levo as parameters for initiation haven't been met. Instructed primary RN to place new IVT consult if pressors are initiated for ultrasound guided peripheral. Patient has 2 PIV's currently.   Serge Main Loyola Mast, RN

## 2022-05-10 NOTE — Progress Notes (Signed)
Pharmacy Antibiotic Note  Cynthia Perry is a 77 y.o. female admitted on 05/09/2022 with  intra-abdominal infection/UTI .  Pharmacy has been consulted for Zosyn dosing. WBC WNL. Renal function good.   Plan: Zosyn 3.375G IV q8h to be infused over 4 hours F/U infectious work-up  Height: 5\' 3"  (160 cm) Weight: 46.9 kg (103 lb 6.3 oz) IBW/kg (Calculated) : 52.4  Temp (24hrs), Avg:99.6 F (37.6 C), Min:99.1 F (37.3 C), Max:100 F (37.8 C)  Recent Labs  Lab 05/09/22 1826 05/09/22 1845 05/09/22 2046 05/10/22 0244  WBC 11.9*  --   --  7.8  CREATININE 0.72  --   --  0.56  LATICACIDVEN  --  1.1 1.4  --     Estimated Creatinine Clearance: 42.9 mL/min (by C-G formula based on SCr of 0.56 mg/dL).    No Known Allergies  Abran Duke, PharmD, BCPS Clinical Pharmacist Phone: [phone]

## 2022-05-10 NOTE — ED Notes (Signed)
Called ICU to see if they are ready for patient. They need 10 minutes.

## 2022-05-10 NOTE — ED Notes (Signed)
Pt's BP extremely low, Dr D. Crosley contacted via epic message & paged. Awaiting response

## 2022-05-10 NOTE — Progress Notes (Signed)
NAME:  LAYLANIE HOFACKER, MRN:  [id number], DOB:  July 02, 1945, LOS: 1 ADMISSION DATE:  05/09/2022, CONSULTATION DATE:  5/7 REFERRING MD:  Othella Boyer, CHIEF COMPLAINT:  hypotension   History of Present Illness:  Ms. Garand is a 77 y/o woman with a history of UC on mesalamine who presented with poor PO intake, diarrhea, SOB, increased confusion. She has had some blood in her diarrhea, which has been going on for about 1 month since she took Bactrim for a UTI .  She has continued to take probiotics per her GI doctors recommendations.  She has not collected a stool sample as prescribed to rule out C. difficile.  Shortness of breath began today.  She was admitted by the hospitalist overnight.  CT demonstrated left hemicolon inflammation.  She was started on Zosyn and recently on steroids due to concern for UC flare.  Overnight she developed progressive hypotension despite 3 L of volume resuscitation; most recent blood pressure 71/56.  PCCM was consulted for management.    Pertinent  Medical History  Alzheimer's UC HLD Severe malnutrition GAD  Significant Hospital Events: Including procedures, antibiotic start and stop dates in addition to other pertinent events   5/6 admission for diarrhea  Interim History / Subjective:  BPs better than soft started on norepinephrine.  Discussed with family.  PICC ordered and placed.  Still unable to really give any history.  Suspected due to underlying advanced dementia  Objective   Blood pressure 108/69, pulse 100, temperature (!) 97.1 F (36.2 C), temperature source Oral, resp. rate 13, height 5\' 3"  (1.6 m), weight 46.9 kg, SpO2 96 %.        Intake/Output Summary (Last 24 hours) at 05/10/2022 1559 Last data filed at 05/10/2022 1436 Gross per 24 hour  Intake 4745.88 ml  Output 1425 ml  Net 3320.88 ml    Filed Weights   05/09/22 1822  Weight: 46.9 kg    Examination: General: frail, elderly woman lying in bed in NAD HENT: [place]/AT, eyes anicteric Lungs:  breathing comfortably on [place], CTA Cardiovascular: S1S2, RRR Abdomen: soft, NT; recent episode of diarrhea Extremities: no peripheral edema, no cyanosis Neuro: Confused, not answering any questions.  Derm: thin skin, no diaphoresis  Lactic acid trended up CT abdomen pelvis personally reviewed-minimal soft tissue mass. Bilateral renal cysts.  Solid stool in rectum. Inflammation in left colon.   Resolved Hospital Problem list     Assessment & Plan:  Hypotension, concern for hypovolemic vs septic shock -confirmed with family central line okay, confirmed DNR, PICC ordered and placed -albumin, additional IVF bolus -Norepinephrine started, MAP goal greater than 65 -con't antibiotics; intra-abdominal source most likely -c diff negative -Stress dose steroids, equivalent of 75 mg of prednisone, enough for potential UC flare given diarrhea but would suspect more distal involvement of the colon although may be the CT is nonspecific -mesalamine  Acute encephalopathy with baseline Alzheimer's  -supportive care -Resume Seroquel  Hypokalemia 2/2 diarrhea -IV repletion ordered, repeat BMP this afternoon  Hypovolemic hyponatremia -Fluid bolus, maintenance fluids in this afternoon, encourage p.o.  Acute anemia -monitor for bleeding -transfuse for Hb <7 or hemodynamically significant bleeding   Best Practice (right click and "Reselect all SmartList Selections" daily)   Diet/type: NPO DVT prophylaxis: prophylactic heparin  GI prophylaxis: N/A Lines: N/A Foley:  N/A Code Status:  DNR Last date of multidisciplinary goals of care discussion [daughter updated by MD 5/7]  Labs   CBC: Recent Labs  Lab 05/09/22 1826 05/10/22 0244  WBC  11.9* 7.8  NEUTROABS  --  6.7  HGB 12.7 10.2*  HCT 40.8 31.5*  MCV 83.6 81.6  PLT 419* 302     Basic Metabolic Panel: Recent Labs  Lab 05/09/22 1826 05/10/22 0244  NA 136 133*  K 3.3* 2.7*  CL 99 103  CO2 25 24  GLUCOSE 85 83  BUN 7* <5*   CREATININE 0.72 0.56  CALCIUM 10.1 9.0    GFR: Estimated Creatinine Clearance: 42.9 mL/min (by C-G formula based on SCr of 0.56 mg/dL). Recent Labs  Lab 05/09/22 1826 05/09/22 1845 05/09/22 2046 05/10/22 0244 05/10/22 0805 05/10/22 1037  PROCALCITON  --   --   --   --  0.13  --   WBC 11.9*  --   --  7.8  --   --   LATICACIDVEN  --  1.1 1.4  --  2.2* 1.6     Liver Function Tests: Recent Labs  Lab 05/09/22 1826  AST 11*  ALT 7  ALKPHOS 73  BILITOT 0.5  PROT 6.3*  ALBUMIN 2.3*    No results for input(s): "LIPASE", "AMYLASE" in the last 168 hours. No results for input(s): "AMMONIA" in the last 168 hours.  ABG No results found for: "PHART", "PCO2ART", "PO2ART", "HCO3", "TCO2", "ACIDBASEDEF", "O2SAT"   Coagulation Profile: No results for input(s): "INR", "PROTIME" in the last 168 hours.  Cardiac Enzymes: No results for input(s): "CKTOTAL", "CKMB", "CKMBINDEX", "TROPONINI" in the last 168 hours.  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   Surgical History:   Past Surgical History:  Procedure Laterality Date   ABDOMINAL HYSTERECTOMY       Social History:   reports that she has never smoked. She has never used smokeless tobacco. She reports that she does not drink alcohol and does not use drugs.   Family History:  Her family  history includes Kidney disease in her mother.   Allergies No Known Allergies   Home Medications  Prior to Admission medications   Medication Sig Start Date End Date Taking? Authorizing Provider  acetaminophen (TYLENOL) 325 MG tablet Take 650 mg by mouth every 6 (six) hours as needed for moderate pain.   Yes [provider]  memantine (NAMENDA) 5 MG tablet Take 1 tablet (5 mg total) by mouth at bedtime. 01/05/22  Yes Marcos Eke, PA-C  mesalamine (APRISO) 0.375 g 24 hr capsule Take 1,500 mg by mouth daily.   Yes [provider]  feeding supplement (ENSURE ENLIVE / ENSURE PLUS) LIQD Take 237 mLs by mouth 2 (two) times daily between meals. Patient not taking: Reported on 03/28/2022 06/02/21   Marguerita Merles Latif, DO  HYDROcodone-acetaminophen (NORCO/VICODIN) 5-325 MG tablet Take 1 tablet by mouth every 6 (six) hours as needed for severe pain. Patient not taking: Reported on 03/28/2022 06/02/21   Marguerita Merles Latif, DO  Multiple Vitamin (MULTIVITAMIN WITH MINERALS) TABS tablet Take 1 tablet by mouth daily. Patient not taking: Reported on 03/28/2022  06/03/21   Marguerita Merles Latif, DO  Nystatin (GERHARDT'S BUTT CREAM) CREA Apply 1 Application topically 2 (two) times daily. Patient not taking: Reported on 05/09/2022 09/08/21   Mliss Sax, MD  QUEtiapine (SEROQUEL) 25 MG tablet Take 0.5 tablets (12.5 mg total) by mouth daily. 06/03/21 09/08/21  Marguerita Merles Latif, DO  QUEtiapine (SEROQUEL) 25 MG tablet Take 1 tablet (25 mg total) by mouth at bedtime. Patient not taking: Reported on 03/28/2022 06/02/21   Merlene Laughter, DO     Critical care time:    CRITICAL CARE Performed by: Karren Burly   Total critical care time: 40 minutes  Critical care time was exclusive of separately billable procedures and treating other patients.  Critical care was necessary to treat or prevent imminent or life-threatening deterioration.  Critical care was time spent personally by me  on the following activities: development of treatment plan with patient and/or surrogate as well as nursing, discussions with consultants, evaluation of patient's response to treatment, examination of patient, obtaining history from patient or surrogate, ordering and performing treatments and interventions, ordering and review of laboratory studies, ordering and review of radiographic studies, pulse oximetry and re-evaluation of patient's condition.    Karren Burly, MD 05/10/22 3:59 PM [facility]  For contact information, see Amion. If no response to pager, please call PCCM consult pager. After hours, 7PM- 7AM, please call Elink.

## 2022-05-10 NOTE — ED Notes (Signed)
BP remains low Dr D. Crosley paged

## 2022-05-10 NOTE — Progress Notes (Signed)
Patient has received a total of 3 L IV fluid bolus.  Current systolic blood pressure 71/42 with a MAP of 49.  PCCM Dr. Chestine Spore has been contacted.  They will evaluate patient

## 2022-05-10 NOTE — ED Notes (Signed)
Pt s/p IVF bolus, BP remains soft. Will continue to monitor.

## 2022-05-10 NOTE — H&P (Signed)
  Transition of Care [facility]) Screening Note   Patient Details  Name: Cynthia Perry Date of Birth: 1945-04-06   Transition of Care [facility]) CM/SW Contact:    Gordy Clement, RN Phone Number: 05/10/2022, 11:05 AM    Transition of Care Department [facility]) has reviewed patient chart.  Patient from home . Metabolic encephalopathy; moderate dementia. Recurrent UTI's    Recommendations and disposition TBD.   TOC will continue to monitor patient advancement through interdisciplinary progression rounds. If new patient transition needs arise, please place a TOC consult.

## 2022-05-11 DIAGNOSIS — A09 Infectious gastroenteritis and colitis, unspecified: Secondary | ICD-10-CM | POA: Diagnosis not present

## 2022-05-11 DIAGNOSIS — G9341 Metabolic encephalopathy: Secondary | ICD-10-CM | POA: Diagnosis not present

## 2022-05-11 LAB — CBC
HCT: 30.6 % — ABNORMAL LOW (ref 36.0–46.0)
Hemoglobin: 10.2 g/dL — ABNORMAL LOW (ref 12.0–15.0)
MCH: 26.8 pg (ref 26.0–34.0)
MCHC: 33.3 g/dL (ref 30.0–36.0)
MCV: 80.5 fL (ref 80.0–100.0)
Platelets: 277 10*3/uL (ref 150–400)
RBC: 3.8 MIL/uL — ABNORMAL LOW (ref 3.87–5.11)
RDW: 17.2 % — ABNORMAL HIGH (ref 11.5–15.5)
WBC: 7.4 10*3/uL (ref 4.0–10.5)
nRBC: 0 % (ref 0.0–0.2)

## 2022-05-11 LAB — COMPREHENSIVE METABOLIC PANEL
ALT: 8 U/L (ref 0–44)
AST: 12 U/L — ABNORMAL LOW (ref 15–41)
Albumin: 2 g/dL — ABNORMAL LOW (ref 3.5–5.0)
Alkaline Phosphatase: 52 U/L (ref 38–126)
Anion gap: 5 (ref 5–15)
BUN: 9 mg/dL (ref 8–23)
CO2: 25 mmol/L (ref 22–32)
Calcium: 9.7 mg/dL (ref 8.9–10.3)
Chloride: 107 mmol/L (ref 98–111)
Creatinine, Ser: 0.6 mg/dL (ref 0.44–1.00)
GFR, Estimated: >60 mL/min (ref >60)
Glucose, Bld: 196 mg/dL — ABNORMAL HIGH (ref 70–99)
Potassium: 4 mmol/L (ref 3.5–5.1)
Sodium: 137 mmol/L (ref 135–145)
Total Bilirubin: 0.5 mg/dL (ref 0.3–1.2)
Total Protein: 5 g/dL — ABNORMAL LOW (ref 6.5–8.1)

## 2022-05-11 LAB — MAGNESIUM: Magnesium: 2.3 mg/dL (ref 1.7–2.4)

## 2022-05-11 LAB — CULTURE, BLOOD (ROUTINE X 2): Culture: NO GROWTH

## 2022-05-11 LAB — PHOSPHORUS: Phosphorus: 2.3 mg/dL — ABNORMAL LOW (ref 2.5–4.6)

## 2022-05-11 LAB — URINE CULTURE

## 2022-05-11 LAB — GLUCOSE, CAPILLARY
Glucose-Capillary: 178 mg/dL — ABNORMAL HIGH (ref 70–99)
Glucose-Capillary: 153 mg/dL — ABNORMAL HIGH (ref 70–99)

## 2022-05-11 MED ORDER — QUETIAPINE FUMARATE 25 MG PO TABS
12.5000 mg | ORAL_TABLET | Freq: Every day | ORAL | Status: DC
  Administered 2022-05-11 – 2022-05-24 (×14): 12.5 mg via ORAL
  Filled 2022-05-11 (×15): qty 1

## 2022-05-11 MED ORDER — HYDROCORTISONE SOD SUC (PF) 100 MG IJ SOLR
50.0000 mg | Freq: Every day | INTRAMUSCULAR | Status: AC
  Administered 2022-05-13: 50 mg via INTRAVENOUS
  Filled 2022-05-11: qty 1

## 2022-05-11 MED ORDER — HYDROCORTISONE SOD SUC (PF) 100 MG IJ SOLR: 50.0000 mg | Freq: Three times a day (TID) | INTRAMUSCULAR | Status: DC

## 2022-05-11 MED ORDER — PREDNISONE 10 MG PO TABS: 10.0000 mg | ORAL_TABLET | Freq: Every day | ORAL | Status: DC

## 2022-05-11 MED ORDER — ADULT MULTIVITAMIN W/MINERALS CH
1.0000 | ORAL_TABLET | Freq: Every day | ORAL | Status: DC
  Administered 2022-05-11 – 2022-05-15 (×5): 1 via ORAL
  Filled 2022-05-11 (×5): qty 1

## 2022-05-11 MED ORDER — LACTATED RINGERS IV SOLN: INTRAVENOUS | Status: DC

## 2022-05-11 MED ORDER — HYDROCORTISONE SOD SUC (PF) 100 MG IJ SOLR
50.0000 mg | Freq: Three times a day (TID) | INTRAMUSCULAR | Status: AC
  Administered 2022-05-11 (×2): 50 mg via INTRAVENOUS
  Filled 2022-05-11 (×2): qty 1

## 2022-05-11 MED ORDER — SODIUM PHOSPHATES 45 MMOLE/15ML IV SOLN
15.0000 mmol | Freq: Once | INTRAVENOUS | Status: AC
  Administered 2022-05-11: 15 mmol via INTRAVENOUS
  Filled 2022-05-11: qty 5

## 2022-05-11 MED ORDER — HYDROCORTISONE SOD SUC (PF) 100 MG IJ SOLR
50.0000 mg | Freq: Two times a day (BID) | INTRAMUSCULAR | Status: AC
  Administered 2022-05-12 (×2): 50 mg via INTRAVENOUS
  Filled 2022-05-11 (×2): qty 1

## 2022-05-11 NOTE — Progress Notes (Signed)
Report called to [facility], all question answered. Paitent A&Ox0, advanced dementia at baseline.  BP 101/64 (76), HR 65, RR 23, 98% on room air.  Patient had BM prior to transfer. Patient cleaned.  LR infusing at 75.

## 2022-05-11 NOTE — Progress Notes (Addendum)
NAME:  Cynthia Perry, MRN:  [id number], DOB:  1945/07/10, LOS: 2 ADMISSION DATE:  05/09/2022, CONSULTATION DATE:  5/7 REFERRING MD:  Othella Boyer, CHIEF COMPLAINT:  hypotension   History of Present Illness:  Ms. Cynthia Perry is a 77 y/o woman with a history of UC on mesalamine who presented with poor PO intake, diarrhea, SOB, increased confusion. She has had some blood in her diarrhea, which has been going on for about 1 month since she took Bactrim for a UTI .  She has continued to take probiotics per her GI doctors recommendations.  She has not collected a stool sample as prescribed to rule out C. difficile.  Shortness of breath began today.  She was admitted by the hospitalist overnight.  CT demonstrated left hemicolon inflammation.  She was started on Zosyn and recently on steroids due to concern for UC flare.  Overnight she developed progressive hypotension despite 3 L of volume resuscitation; most recent blood pressure 71/56.  PCCM was consulted for management.  Pertinent  Medical History  Alzheimer's UC HLD Severe malnutrition GAD  Significant Hospital Events: Including procedures, antibiotic start and stop dates in addition to other pertinent events   5/6 admission for diarrhea 5/8 off pressors; transfer to floor  Interim History / Subjective:  Remains confused but redirectable, says she is thirsty this morning.   Levophed weaned overnight, maps above 65. Afebrile and WBC stable.   Objective   Blood pressure (!) 107/59, pulse (!) 52, temperature (!) 97.3 F (36.3 C), temperature source Axillary, resp. rate 14, height 5\' 3"  (1.6 m), weight 46.9 kg, SpO2 98 %.        Intake/Output Summary (Last 24 hours) at 05/11/2022 0728 Last data filed at 05/11/2022 0700 Gross per 24 hour  Intake 2982.2 ml  Output 2075 ml  Net 907.2 ml   Filed Weights   05/09/22 1822  Weight: 46.9 kg   Examination: General: frail, elderly woman lying in bed in NAD HENT: [place]/AT, eyes anicteric. Dry mucous  membranes.  Lungs: breathing comfortably on [place], CTA Cardiovascular: S1S2, RRR. 1/6 systolic murmur in LUSB. Abdomen: soft, NT. BS+ Extremities: no peripheral edema, no cyanosis Neuro: Confused, not oriented to self. Moves all extremities equally. No focal deficits appreciated Derm: thin skin, no diaphoresis. No rashes or lesions.   -CT abdomen pelvis personally reviewed-minimal soft tissue mass. Bilateral renal cysts.  Solid stool in rectum. Inflammation in left colon.   -Stool PCR negative.   Resolved Hospital Problem list     Assessment & Plan:  Hypotension, concern for hypovolemic vs septic shock 2/2 to UC flare Overall hemodynamics have improved with aggressive fluid resuscitation yesterday, levophed requirement decreased overnight and came off this morning. She also has poor PO intake and chronic malnutrition at baseline that is contributing to hypotension. More likely UC flare than infectious etiology as she has remained afebrile with stable WBC, stool PCR and c diff negative. Will be appropriate for transfer to floor today.  -Restart maintenance fluids -discontinue antibiotics -Stress dose steroids, taper over 5 days -mesalamine  Acute encephalopathy with baseline Alzheimer's  She remains confused but can respond to commands, she seems to be at baseline with correction of metabolic/reversible causes.  -supportive care -Decrease seroquel to home dose 12.65m  Hypovolemic hyponatremia  Na 137, appears dry on exam this morning.  -Restart maintenance fluids, encourage p.o.  Electrolyte derangements 2/2 diarrhea -Replete as needed  Acute anemia -monitor for bleeding -transfuse for Hb <7 or hemodynamically significant bleeding  Best Practice (  right click and "Reselect all SmartList Selections" daily)   Diet/type: NPO DVT prophylaxis: prophylactic heparin  GI prophylaxis: N/A Lines: N/A Foley:  N/A Code Status:  DNR Last date of multidisciplinary goals of care discussion  [daughter updated by MD 5/7]  Labs   CBC: Recent Labs  Lab 05/09/22 1826 05/10/22 0244 05/11/22 0332  WBC 11.9* 7.8 7.4  NEUTROABS  --  6.7  --   HGB 12.7 10.2* 10.2*  HCT 40.8 31.5* 30.6*  MCV 83.6 81.6 80.5  PLT 419* 302 277    Basic Metabolic Panel: Recent Labs  Lab 05/09/22 1826 05/10/22 0244 05/10/22 1601 05/11/22 0332  NA 136 133* 139 137  K 3.3* 2.7* 3.5 4.0  CL 99 103 107 107  CO2 25 24 24 25   GLUCOSE 85 83 188* 196*  BUN 7* <5* 6* 9  CREATININE 0.72 0.56 0.50 0.60  CALCIUM 10.1 9.0 9.6 9.7  MG  --   --   --  2.3  PHOS  --   --   --  2.3*   GFR: Estimated Creatinine Clearance: 42.9 mL/min (by C-G formula based on SCr of 0.6 mg/dL). Recent Labs  Lab 05/09/22 1826 05/09/22 1845 05/09/22 2046 05/10/22 0244 05/10/22 0805 05/10/22 1037 05/11/22 0332  PROCALCITON  --   --   --   --  0.13  --   --   WBC 11.9*  --   --  7.8  --   --  7.4  LATICACIDVEN  --  1.1 1.4  --  2.2* 1.6  --     Liver Function Tests: Recent Labs  Lab 05/09/22 1826 05/11/22 0332  AST 11* 12*  ALT 7 8  ALKPHOS 73 52  BILITOT 0.5 0.5  PROT 6.3* 5.0*  ALBUMIN 2.3* 2.0*   No results for input(s): "LIPASE", "AMYLASE" in the last 168 hours. No results for input(s): "AMMONIA" in the last 168 hours.  ABG No results found for: "PHART", "PCO2ART", "PO2ART", "HCO3", "TCO2", "ACIDBASEDEF", "O2SAT"   Coagulation Profile: No results for input(s): "INR", "PROTIME" in the last 168 hours.  Cardiac Enzymes: No results for input(s): "CKTOTAL", "CKMB", "CKMBINDEX", "TROPONINI" in the last 168 hours.  HbA1C: No results found for: "HGBA1C"  CBG: Recent Labs  Lab 05/10/22 1126 05/10/22 1509 05/10/22 1930 05/10/22 2325 05/11/22 0334  GLUCAP 109* 149* 224* 167* 178*    Review of Systems:   Unable to be obtained due to inability to provide accurate history  Past Medical History:  She,  has a past medical history of Abnormal urinalysis (07/23/2020), Debilitated, Elevated  cholesterol, Generalized abdominal pain, Generalized anxiety disorder, History of ASCVD, Hypercalcemia, Hypercholesteremia, Hypotension due to drugs, Kidney stones, Left sided ulcerative colitis, Left upper quadrant abdominal mass, Major neurocognitive disorder due to Alzheimer's disease (12/30/2020), Tachycardia, and Vitamin B12 deficiency.   Surgical History:   Past Surgical History:  Procedure Laterality Date   ABDOMINAL HYSTERECTOMY       Social History:   reports that she has never smoked. She has never used smokeless tobacco. She reports that she does not drink alcohol and does not use drugs.   Family History:  Her family history includes Kidney disease in her mother.   Allergies No Known Allergies   Home Medications  Prior to Admission medications   Medication Sig Start Date End Date Taking? Authorizing Provider  acetaminophen (TYLENOL) 325 MG tablet Take 650 mg by mouth every 6 (six) hours as needed for moderate pain.   Yes [provider]  memantine (NAMENDA) 5 MG tablet Take 1 tablet (5 mg total) by mouth at bedtime. 01/05/22  Yes Marcos Eke, PA-C  mesalamine (APRISO) 0.375 g 24 hr capsule Take 1,500 mg by mouth daily.   Yes [provider]  feeding supplement (ENSURE ENLIVE / ENSURE PLUS) LIQD Take 237 mLs by mouth 2 (two) times daily between meals. Patient not taking: Reported on 03/28/2022 06/02/21   Marguerita Merles Latif, DO  HYDROcodone-acetaminophen (NORCO/VICODIN) 5-325 MG tablet Take 1 tablet by mouth every 6 (six) hours as needed for severe pain. Patient not taking: Reported on 03/28/2022 06/02/21   Marguerita Merles Latif, DO  Multiple Vitamin (MULTIVITAMIN WITH MINERALS) TABS tablet Take 1 tablet by mouth daily. Patient not taking: Reported on 03/28/2022 06/03/21   Marguerita Merles Latif, DO  Nystatin (GERHARDT'S BUTT CREAM) CREA Apply 1 Application topically 2 (two) times daily. Patient not taking: Reported on 05/09/2022 09/08/21   Mliss Sax, MD   QUEtiapine (SEROQUEL) 25 MG tablet Take 0.5 tablets (12.5 mg total) by mouth daily. 06/03/21 09/08/21  Marguerita Merles Latif, DO  QUEtiapine (SEROQUEL) 25 MG tablet Take 1 tablet (25 mg total) by mouth at bedtime. Patient not taking: Reported on 03/28/2022 06/02/21   Merlene Laughter, DO     Critical care time:      For contact information, see Amion. If no response to pager, please call PCCM consult pager. After hours, 7PM- 7AM, please call Elink.

## 2022-05-11 NOTE — Progress Notes (Signed)
Initial Nutrition Assessment  DOCUMENTATION CODES:  Severe malnutrition in context of social or environmental circumstances, Underweight  INTERVENTION:  Adjust to DYS3 for poor dentition 1-1 feeding assistance Automatic trays from dining services Nectar Thick Mighty Shake TID, each supplement provides 330 kcal and 7g protein Magic cup TID with meals, each supplement provides 290 kcal and 9 grams of protein MVI with minerals daily  NUTRITION DIAGNOSIS:  Severe Malnutrition related to social / environmental circumstances (inability to self-feed) as evidenced by severe fat depletion, severe muscle depletion.  GOAL:  Patient will meet greater than or equal to 90% of their needs  MONITOR:  PO intake, Supplement acceptance, Weight trends  REASON FOR ASSESSMENT:  Malnutrition Screening Tool    ASSESSMENT:  Pt with hx of ulcerative colitis, HLD, and advanced dementia presented to ED with diarrhea after being treated with antibiotics. Found to be dehydrated on admission.  Pt appeared to be sleeping at the time of assessment but at light touch became very alert and tried to take her gown off.   Pt unable to provide a hx but did say several times she wanted apples and apple cider. Pt extremely malnourished on exam. Pt will be unable to feed self, will need 1-1 assistance for tray set up and all PO intake. Also noted poor dentition. Will adjust to DYS3 diet to ensure that soft food are sent to pt.     Intake/Output Summary (Last 24 hours) at 05/11/2022 1359 Last data filed at 05/11/2022 0900 Gross per 24 hour  Intake 499.9 ml  Output 1450 ml  Net -950.1 ml  Net IO Since Admission: 2,895.88 mL [05/11/22 1359]  Average Meal Intake: 5/7-5/8: 20% intake x 2 recorded meals  Nutritionally Relevant Medications: Scheduled Meds:  acidophilus  1 capsule Oral TID   hydrocortisone sod succinate inj  50 mg Intravenous Q8H   memantine  5 mg Oral Daily   mesalamine  2.4 g Oral Daily   Continuous  Infusions:  lactated ringers 75 mL/hr at 05/11/22 0809   sodium phosphate 15 mmol in sodium chloride 0.9 % 250 mL infusion     PRN Meds: docusate sodium, polyethylene glycol  Labs Reviewed: Phosphorus 2.3 CBG ranges from 78-224 mg/dL over the last 24 hours  NUTRITION - FOCUSED PHYSICAL EXAM: Flowsheet Row Most Recent Value  Orbital Region Severe depletion  Upper Arm Region Severe depletion  Thoracic and Lumbar Region Severe depletion  Buccal Region Severe depletion  Temple Region Severe depletion  Clavicle Bone Region Severe depletion  Clavicle and Acromion Bone Region Severe depletion  Scapular Bone Region Severe depletion  Dorsal Hand Severe depletion  Patellar Region Severe depletion  Anterior Thigh Region Severe depletion  Posterior Calf Region Severe depletion  Edema (RD Assessment) None  Hair Reviewed  Eyes Reviewed  Mouth Reviewed  Skin Reviewed  Nails Reviewed    Diet Order:   Diet Order             Diet regular Room service appropriate? Yes; Fluid consistency: Thin  Diet effective now                   EDUCATION NEEDS:  Not appropriate for education at this time  Skin:  Skin Assessment: Reviewed RN Assessment  Last BM:  5/8 - type 7  Height:  Ht Readings from Last 1 Encounters:  05/09/22 5\' 3"  (1.6 m)    Weight:  Wt Readings from Last 1 Encounters:  05/09/22 46.9 kg    Ideal Body Weight:  52.3 kg  BMI:  Body mass index is 18.32 kg/m.  Estimated Nutritional Needs:  Kcal:  1400-1600 kcal/d Protein:  65-80g/d Fluid:  >/=1.5L/d    Greig Castilla, RD, LDN Clinical Dietitian RD pager # available in [facility]  After hours/weekend pager # available in [facility]

## 2022-05-12 DIAGNOSIS — G9341 Metabolic encephalopathy: Secondary | ICD-10-CM | POA: Diagnosis not present

## 2022-05-12 LAB — CULTURE, BLOOD (ROUTINE X 2): Special Requests: ADEQUATE

## 2022-05-12 LAB — URINE CULTURE: Culture: >=100000 — AB

## 2022-05-12 NOTE — Progress Notes (Signed)
PROGRESS NOTE   Cynthia Perry  ZHY:[id number] DOB: 1945/02/26 DOA: 05/09/2022 PCP: Mliss Sax, MD  Brief Narrative:   77 year old white female Known ulcerative colitis follows with [facility] gastroenterology Dr. Bosie Clos on mesalamine Hyperthyroid in the past  Severe dementia with mild delirium/agitation MoCA testing 01/2022 was 7/30 follows with [facility] neurology and is on Namenda Generalized anxiety B12 deficiency  Recent UTI treated by PCP with Bactrim resulting in InSu and diarrhea-became more aggressive at home confused and lethargic 5/7 CCM consulted secondary to refractory hypotension thought to be secondary to either hypovolemia or septic shock-started on norepinephrine/stress dosing of steroids PICC line started  Hospital-Problem based course  Undifferentiated shock Blood pressure is better patient transferred to [facility] 5/9--continuing saline 75 cc/H and saline lock in a.m. 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  Impaired glucose tolerance CBGs ranging 180s to 190s Patient needs informed decision making with family in the next several weeks in the outpatient setting with regards to glycemic control She has pretty severe dementia and probably would not be a candidate for strict glycemic control would defer to PCP  Hypovol hyponatremia--mild, Hypokalemia Resolved [k replaced aggressively--now resolved also] Fluids  as above--NSL in am  Therapy to eval today Doesn't need sitter unless aggressive D/c picc prior to d/c from hospital   DVT prophylaxis: scd Code Status: DNR confirmed Family Communication: talked to daughter Rudolpho Sevin patient reaching clinical stability Disposition:  Status is: Inpatient Remains inpatient appropriate because:   Requires therapy eval and less than 2-3 stools daily--lives with elderly husband with support form Daughters--needs to be semi-independent prior to return home   Subjective: Awake incoherent Cannot tell me time date place person Note overnight events with confusions and pulling out IV's   Objective: Vitals:   05/12/22 0017 05/12/22 0200 05/12/22 0539 05/12/22 1008  BP: (!) 133/92  118/77 134/75  Pulse: 98  89 80  Resp: 18  18 18   Temp:  97.7 F (36.5 C) 97.6 F (36.4 C)   TempSrc:  Axillary Oral   SpO2: 97%  99% 98%  Weight:   55.1 kg   Height:       No intake or output data in the 24 hours ending 05/12/22 1027 Filed Weights   05/09/22 1822 05/12/22 0539  Weight: 46.9 kg 55.1 kg    Examination:  Cachectic wf  Cta b no added sound No le edema slight tachy Abd soft nt no rebound no gaurd  Data Reviewed: personally reviewed   CBC    Component Value Date/Time   WBC 7.4 05/11/2022 0332   RBC 3.80 (L) 05/11/2022 0332   HGB 10.2 (L) 05/11/2022 0332   HCT 30.6 (L) 05/11/2022 0332   PLT 277 05/11/2022 0332   MCV 80.5 05/11/2022 0332   MCH 26.8 05/11/2022 0332   MCHC 33.3 05/11/2022 0332   RDW 17.2 (H) 05/11/2022 0332   LYMPHSABS 0.4 (L) 05/10/2022 0244   MONOABS 0.7 05/10/2022 0244   EOSABS 0.0 05/10/2022 0244   BASOSABS  0.0 05/10/2022 0244      Latest Ref Rng & Units 05/11/2022    3:32 AM 05/10/2022    4:01 PM 05/10/2022    2:44 AM  CMP  Glucose 70 - 99 mg/dL 308  657  83   BUN 8 - 23 mg/dL 9  6  <5   Creatinine 8.46 - 1.00 mg/dL 9.62  9.52  8.41   Sodium 135 - 145 mmol/L 137  139  133   Potassium 3.5 - 5.1 mmol/L  4.0  3.5  2.7   Chloride 98 - 111 mmol/L 107  107  103   CO2 22 - 32 mmol/L 25  24  24    Calcium 8.9 - 10.3 mg/dL 9.7  9.6  9.0   Total Protein 6.5 - 8.1 g/dL 5.0     Total Bilirubin 0.3 - 1.2 mg/dL 0.5     Alkaline Phos 38 - 126 U/L 52     AST 15 - 41 U/L 12     ALT 0 - 44 U/L 8        Radiology Studies: No results found.   Scheduled Meds:  acidophilus  1 capsule Oral TID   Chlorhexidine Gluconate Cloth  6 each Topical Daily   Gerhardt's butt cream  1 Application Topical BID   heparin  5,000 Units Subcutaneous BID   hydrocortisone sod succinate (SOLU-CORTEF) inj  50 mg Intravenous Q12H   Followed by   Melene Muller ON 05/13/2022] hydrocortisone sod succinate (SOLU-CORTEF) inj  50 mg Intravenous Daily   Followed by   Melene Muller ON 05/14/2022] predniSONE  10 mg Oral Q breakfast   memantine  5 mg Oral Daily   mesalamine  2.4 g Oral Daily   multivitamin with minerals  1 tablet Oral Daily   QUEtiapine  12.5 mg Oral QHS   sodium chloride flush  10-40 mL Intracatheter Q12H   Continuous Infusions:  sodium chloride 250 mL (05/11/22 1000)   lactated ringers 75 mL/hr at 05/11/22 2153     LOS: 3 days   Time spent: 53  Rhetta Mura, MD [facility] To contact the attending provider between 7A-7P or the covering provider during after hours 7P-7A, please log into the web site [email or web address] and access using universal [place] password for that web site. If you do not have the password, please call the hospital operator.  05/12/2022, 10:27 AM

## 2022-05-12 NOTE — Progress Notes (Signed)
Patient alert to self only, confused, pulled out picc line and piv overnight; site and line assessed, both intact, new Piv by IV team, bilateral mittens on, Incontinent of bowel and bladder, purewick on. Repositionned and turned, safety/fall prec maintaned, poc continue.

## 2022-05-12 NOTE — Plan of Care (Signed)
  Problem: Education: Goal: Knowledge of General Education information will improve Description: Including pain rating scale, medication(s)/side effects and non-pharmacologic comfort measures 05/12/2022 1131 by Letta Moynahan, RN Outcome: Progressing 05/12/2022 1131 by Letta Moynahan, RN Outcome: Not Progressing   Problem: Health Behavior/Discharge Planning: Goal: Ability to manage health-related needs will improve 05/12/2022 1131 by Letta Moynahan, RN Outcome: Progressing 05/12/2022 1131 by Letta Moynahan, RN Outcome: Not Progressing   Problem: Clinical Measurements: Goal: Ability to maintain clinical measurements within normal limits will improve 05/12/2022 1131 by Letta Moynahan, RN Outcome: Progressing 05/12/2022 1131 by Letta Moynahan, RN Outcome: Progressing Goal: Will remain free from infection 05/12/2022 1131 by Letta Moynahan, RN Outcome: Progressing 05/12/2022 1131 by Letta Moynahan, RN Outcome: Progressing Goal: Diagnostic test results will improve 05/12/2022 1131 by Letta Moynahan, RN Outcome: Progressing 05/12/2022 1131 by Letta Moynahan, RN Outcome: Progressing Goal: Respiratory complications will improve 05/12/2022 1131 by Letta Moynahan, RN Outcome: Progressing 05/12/2022 1131 by Letta Moynahan, RN Outcome: Progressing Goal: Cardiovascular complication will be avoided 05/12/2022 1131 by Letta Moynahan, RN Outcome: Progressing 05/12/2022 1131 by Letta Moynahan, RN Outcome: Progressing   Problem: Activity: Goal: Risk for activity intolerance will decrease 05/12/2022 1131 by Letta Moynahan, RN Outcome: Progressing 05/12/2022 1131 by Letta Moynahan, RN Outcome: Progressing   Problem: Nutrition: Goal: Adequate nutrition will be maintained 05/12/2022 1131 by Letta Moynahan, RN Outcome: Progressing 05/12/2022 1131 by Letta Moynahan, RN Outcome: Progressing   Problem: Coping: Goal: Level of anxiety will decrease 05/12/2022 1131 by Letta Moynahan, RN Outcome: Progressing 05/12/2022 1131 by  Letta Moynahan, RN Outcome: Progressing   Problem: Elimination: Goal: Will not experience complications related to bowel motility 05/12/2022 1131 by Letta Moynahan, RN Outcome: Progressing 05/12/2022 1131 by Letta Moynahan, RN Outcome: Progressing Goal: Will not experience complications related to urinary retention 05/12/2022 1131 by Letta Moynahan, RN Outcome: Progressing 05/12/2022 1131 by Letta Moynahan, RN Outcome: Progressing   Problem: Pain Managment: Goal: General experience of comfort will improve 05/12/2022 1131 by Letta Moynahan, RN Outcome: Progressing 05/12/2022 1131 by Letta Moynahan, RN Outcome: Progressing   Problem: Safety: Goal: Ability to remain free from injury will improve 05/12/2022 1131 by Letta Moynahan, RN Outcome: Progressing 05/12/2022 1131 by Letta Moynahan, RN Outcome: Progressing   Problem: Skin Integrity: Goal: Risk for impaired skin integrity will decrease 05/12/2022 1131 by Letta Moynahan, RN Outcome: Progressing 05/12/2022 1131 by Letta Moynahan, RN Outcome: Progressing

## 2022-05-13 DIAGNOSIS — G9341 Metabolic encephalopathy: Secondary | ICD-10-CM | POA: Diagnosis not present

## 2022-05-13 LAB — COMPREHENSIVE METABOLIC PANEL
ALT: 9 U/L (ref 0–44)
AST: 14 U/L — ABNORMAL LOW (ref 15–41)
Albumin: 1.9 g/dL — ABNORMAL LOW (ref 3.5–5.0)
Alkaline Phosphatase: 61 U/L (ref 38–126)
Anion gap: 5 (ref 5–15)
BUN: 8 mg/dL (ref 8–23)
CO2: 28 mmol/L (ref 22–32)
Calcium: 9.7 mg/dL (ref 8.9–10.3)
Chloride: 103 mmol/L (ref 98–111)
Creatinine, Ser: 0.61 mg/dL (ref 0.44–1.00)
GFR, Estimated: >60 mL/min (ref >60)
Glucose, Bld: 169 mg/dL — ABNORMAL HIGH (ref 70–99)
Potassium: 2.8 mmol/L — ABNORMAL LOW (ref 3.5–5.1)
Sodium: 136 mmol/L (ref 135–145)
Total Bilirubin: 0.3 mg/dL (ref 0.3–1.2)
Total Protein: 4.7 g/dL — ABNORMAL LOW (ref 6.5–8.1)

## 2022-05-13 LAB — CBC
HCT: 30.4 % — ABNORMAL LOW (ref 36.0–46.0)
Hemoglobin: 10 g/dL — ABNORMAL LOW (ref 12.0–15.0)
MCH: 26.5 pg (ref 26.0–34.0)
MCHC: 32.9 g/dL (ref 30.0–36.0)
MCV: 80.4 fL (ref 80.0–100.0)
Platelets: 241 10*3/uL (ref 150–400)
RBC: 3.78 MIL/uL — ABNORMAL LOW (ref 3.87–5.11)
RDW: 17 % — ABNORMAL HIGH (ref 11.5–15.5)
WBC: 6.8 10*3/uL (ref 4.0–10.5)
nRBC: 0 % (ref 0.0–0.2)

## 2022-05-13 LAB — MAGNESIUM: Magnesium: 1.5 mg/dL — ABNORMAL LOW (ref 1.7–2.4)

## 2022-05-13 MED ORDER — POTASSIUM CHLORIDE CRYS ER 20 MEQ PO TBCR
40.0000 meq | EXTENDED_RELEASE_TABLET | Freq: Every day | ORAL | Status: DC
  Administered 2022-05-13 – 2022-05-15 (×3): 40 meq via ORAL
  Filled 2022-05-13 (×3): qty 2

## 2022-05-13 MED ORDER — LOPERAMIDE HCL 2 MG PO CAPS
2.0000 mg | ORAL_CAPSULE | Freq: Four times a day (QID) | ORAL | Status: AC
  Administered 2022-05-13 (×2): 2 mg via ORAL
  Filled 2022-05-13 (×2): qty 1

## 2022-05-13 MED ORDER — POTASSIUM CHLORIDE 2 MEQ/ML IV SOLN
INTRAVENOUS | Status: AC
  Filled 2022-05-13 (×2): qty 1000

## 2022-05-13 MED ORDER — POTASSIUM CHLORIDE 2 MEQ/ML IV SOLN: INTRAVENOUS | Status: DC

## 2022-05-13 MED ORDER — MAGNESIUM SULFATE 2 GM/50ML IV SOLN
2.0000 g | Freq: Once | INTRAVENOUS | Status: AC
  Administered 2022-05-13: 2 g via INTRAVENOUS
  Filled 2022-05-13: qty 50

## 2022-05-13 NOTE — Progress Notes (Signed)
Physical Therapy Treatment Patient Details Name: Cynthia Perry MRN: [id number] DOB: 09-10-1945 Today's Date: 05/13/2022   History of Present Illness 77 y.o. female presents to [facility] hospital on 05/09/2022 with loose watery stools, AMS, hypotension. Pt with recent UTI on bactrim. PMH includes ulcerative colitis, anxiety, depression, alzheimer's disease, HTN, GAD.    PT Comments    Pt tolerates treatment well, progressing to ambulation with use of brief. Pt remains incontinent of stool, with frequent diarrhea (detailed below in general comments section). Pt remains generally weak at this time, requiring some physical assistance for bed mobility, and she demonstrates mild instability when up and ambulating with hand hold. PT is hopeful the pt will continue to progress with frequent mobilization from staff. Pt does remain weak and would benefit from physical assistance for all out of bed activity at this time. Due to her history of dementia she would likely fair better returning to a familiar environment of her home, however if her spouse is unable to provide minA for ambulation then short term inpatient PT services may be beneficial in reducing falls risk.   Recommendations for follow up therapy are one component of a multi-disciplinary discharge planning process, led by the attending physician.  Recommendations may be updated based on patient status, additional functional criteria and insurance authorization.  Follow Up Recommendations       Assistance Recommended at Discharge Intermittent Supervision/Assistance  Patient can return home with the following A little help with walking and/or transfers;A lot of help with bathing/dressing/bathroom;Assistance with cooking/housework;Assistance with feeding;Direct supervision/assist for medications management;Direct supervision/assist for financial management;Assist for transportation;Help with stairs or ramp for entrance   Equipment Recommendations  None  recommended by PT    Recommendations for Other Services       Precautions / Restrictions Precautions Precautions: Fall Precaution Comments: persistent diarrhea Restrictions Weight Bearing Restrictions: No     Mobility  Bed Mobility Overal bed mobility: Needs Assistance Bed Mobility: Supine to Sit, Sit to Supine     Supine to sit: Min assist, HOB elevated Sit to supine: Mod assist        Transfers Overall transfer level: Needs assistance Equipment used: 1 person hand held assist Transfers: Sit to/from Stand Sit to Stand: Min guard           General transfer comment: verbal cues for initiation, increased time, benefits from hand hold    Ambulation/Gait Ambulation/Gait assistance: Min assist Gait Distance (Feet): 60 Feet Assistive device: 1 person hand held assist Gait Pattern/deviations: Step-to pattern Gait velocity: reduced Gait velocity interpretation: <1.31 ft/sec, indicative of household ambulator   General Gait Details: pt with slowed step-to gait, increased lateral sway with 2 mild lateral losses of balance which pt corrects with stepping strategy. Pt benefits from hand hold to aide in initiating gait, directing path, and providing support for balance. Pt also utilizes furniture with opposite UE periodically during ambulation   Stairs             Wheelchair Mobility    Modified Rankin (Stroke Patients Only)       Balance Overall balance assessment: Needs assistance Sitting-balance support: No upper extremity supported, Feet supported Sitting balance-Leahy Scale: Good     Standing balance support: Single extremity supported, Reliant on assistive device for balance Standing balance-Leahy Scale: Poor                              Cognition Arousal/Alertness: Awake/alert Behavior During Therapy:  Flat affect Overall Cognitive Status: History of cognitive impairments - at baseline                                  General Comments: advanced dementia, oriented to self only, follows simple commands with increased time        Exercises      General Comments General comments (skin integrity, edema, etc.): VSS on RA. Nurse tech recently completed bath upon PT arrival, by the time pt began to perform bed mobility she had already produced more diarrhea. PT was able to apply a brief to allow for containment of continued diarrhea during ambulation. PT assisted in hygiene tasks at end of ambulation, leaving pt clean upon departure.      Pertinent Vitals/Pain Pain Assessment Pain Assessment: PAINAD Faces Pain Scale: No hurt Breathing: normal Negative Vocalization: none Facial Expression: smiling or inexpressive Body Language: relaxed Consolability: no need to console PAINAD Score: 0    Home Living Family/patient expects to be discharged to:: Private residence Living Arrangements: Spouse/significant other Available Help at Discharge: Family;Available 24 hours/day Type of Home: House Home Access: Stairs to enter       Home Layout: Two level Home Equipment: BSC/3in1;Shower seat;Grab bars - tub/shower;Grab bars - toilet      Prior Function            PT Goals (current goals can now be found in the care plan section) Acute Rehab PT Goals Patient Stated Goal: to resolve colitis and return home Progress towards PT goals: Progressing toward goals    Frequency    Min 3X/week      PT Plan Current plan remains appropriate    Co-evaluation              AM-PAC PT "6 Clicks" Mobility   Outcome Measure  Help needed turning from your back to your side while in a flat bed without using bedrails?: A Little Help needed moving from lying on your back to sitting on the side of a flat bed without using bedrails?: A Little Help needed moving to and from a bed to a chair (including a wheelchair)?: A Little Help needed standing up from a chair using your arms (e.g., wheelchair or bedside  chair)?: A Little Help needed to walk in hospital room?: A Little Help needed climbing 3-5 steps with a railing? : Total 6 Click Score: 16    End of Session Equipment Utilized During Treatment: Gait belt Activity Tolerance: Patient tolerated treatment well Patient left: in bed;with call bell/phone within reach;with bed alarm set Nurse Communication: Mobility status PT Visit Diagnosis: Other abnormalities of gait and mobility (R26.89);Muscle weakness (generalized) (M62.81)     Time: 1212-1229 PT Time Calculation (min) (ACUTE ONLY): 17 min  Charges:  $Gait Training: 8-22 mins                     Arlyss Gandy, PT, DPT Acute Rehabilitation Office [phone]    Arlyss Gandy 05/13/2022, 2:36 PM

## 2022-05-13 NOTE — Progress Notes (Signed)
PROGRESS NOTE   Cynthia Perry  ZOX:[id number] DOB: 03-19-1945 DOA: 05/09/2022 PCP: Mliss Sax, MD  Brief Narrative:   77 year old white female Known ulcerative colitis follows with [facility] gastroenterology Dr. Bosie Clos on mesalamine Hyperthyroid in the past  Severe dementia with mild delirium/agitation MoCA testing 01/2022 was 7/30 follows with [facility] neurology and is on Namenda Generalized anxiety B12 deficiency  Recent UTI treated by PCP with Bactrim resulting in InSu and diarrhea-became more aggressive at home confused and lethargic 5/7 CCM consulted secondary to refractory hypotension thought to be secondary to either hypovolemia or septic shock-started on norepinephrine/stress dosing of steroids PICC line started  Hospital-Problem based course  Undifferentiated shock Blood pressure is better patient transferred to [facility] 5/9--continuing saline as below 24 more hours Steroids Cortef being weaned to baseline 10 mg prednisone from 5/11 Continue mesalamine 2.4 daily, continue acidophilus 1 capsule 3 times daily  discontinue MiraLAX and senna Will give 2 doses of Imodium given the patient is having some diarrhea  Pyelo/UTI Etiology not entirely clear--wasn't clean catch urine No high grade fever, no leuks--hold further Rx given recent OP Rx--lactic acidosis likely non-infectious  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  Impaired glucose tolerance CBGs ranging 180s to 190s Patient needs informed decision making with family in the next several weeks in the outpatient setting with regards to glycemic control She has pretty severe dementia and probably would not be a candidate for strict glycemic control would defer to PCP  Hypovol  hyponatremia--resolved Recurrent severe hypokalemia Replacing potassium both with p.o. K. Dur as well as with saline containing 40 of K for 20 hours Magnesium 1.5 therefore we will give 2 g of magnesium now Repeat labs in the morning  Therapy to eval today Doesn't need sitter unless aggressive D/c picc prior to d/c from hospital   DVT prophylaxis: scd Code Status: DNR confirmed Family Communication: called daughter Clydie Braun Talley--understands stabilizing and await further therapy evaluation with regards to planning Disposition:  Status is: Inpatient Remains inpatient appropriate because:   Therapy does recommend home health if all remains stable  Subjective:  Incoherent no ROS cannot be obtained  Objective: Vitals:   05/12/22 1823 05/12/22 2119 05/13/22 0424 05/13/22 0718  BP: (!) 140/86 133/82 122/75 110/78  Pulse: (!) 110 (!) 105 (!) 101 87  Resp: 18 17 16 16   Temp: (!) 97.4 F (36.3 C) 97.7 F (36.5 C) (!) 97.5 F (36.4 C) 98.1 F (36.7 C)  TempSrc: Oral Oral Axillary Oral  SpO2: 97% 98% 98% 100%  Weight:      Height:        Intake/Output Summary (Last 24 hours) at 05/13/2022 1348 Last data filed at 05/12/2022 1848 Gross per 24 hour  Intake --  Output 150 ml  Net -150 ml   Filed Weights   05/09/22 1822 05/12/22 0539  Weight: 46.9 kg 55.1 kg    Examination:  Cachectic wf  Cta b no added sound No le edema slight tachy Abd soft nt no rebound no gaurd  Data Reviewed: personally reviewed   CBC    Component Value Date/Time   WBC 6.8 05/13/2022 0256   RBC 3.78 (L) 05/13/2022 0256   HGB 10.0 (L) 05/13/2022 0256   HCT 30.4 (L) 05/13/2022 0256   PLT 241 05/13/2022 0256  MCV 80.4 05/13/2022 0256   MCH 26.5 05/13/2022 0256   MCHC 32.9 05/13/2022 0256   RDW 17.0 (H) 05/13/2022 0256   LYMPHSABS 0.4 (L) 05/10/2022 0244   MONOABS 0.7 05/10/2022 0244   EOSABS 0.0 05/10/2022 0244   BASOSABS 0.0 05/10/2022 0244      Latest Ref Rng & Units 05/13/2022     2:56 AM 05/11/2022    3:32 AM 05/10/2022    4:01 PM  CMP  Glucose 70 - 99 mg/dL 829  562  130   BUN 8 - 23 mg/dL 8  9  6    Creatinine 0.44 - 1.00 mg/dL 8.65  7.84  6.96   Sodium 135 - 145 mmol/L 136  137  139   Potassium 3.5 - 5.1 mmol/L 2.8  4.0  3.5   Chloride 98 - 111 mmol/L 103  107  107   CO2 22 - 32 mmol/L 28  25  24    Calcium 8.9 - 10.3 mg/dL 9.7  9.7  9.6   Total Protein 6.5 - 8.1 g/dL 4.7  5.0    Total Bilirubin 0.3 - 1.2 mg/dL 0.3  0.5    Alkaline Phos 38 - 126 U/L 61  52    AST 15 - 41 U/L 14  12    ALT 0 - 44 U/L 9  8       Radiology Studies: No results found.   Scheduled Meds:  acidophilus  1 capsule Oral TID   Chlorhexidine Gluconate Cloth  6 each Topical Daily   Gerhardt's butt cream  1 Application Topical BID   heparin  5,000 Units Subcutaneous BID   loperamide  2 mg Oral Q6H   memantine  5 mg Oral Daily   mesalamine  2.4 g Oral Daily   multivitamin with minerals  1 tablet Oral Daily   potassium chloride  40 mEq Oral Daily   [START ON 05/14/2022] predniSONE  10 mg Oral Q breakfast   QUEtiapine  12.5 mg Oral QHS   sodium chloride flush  10-40 mL Intracatheter Q12H   Continuous Infusions:  sodium chloride 250 mL (05/11/22 1000)   lactated ringers 1,000 mL with potassium chloride 60 mEq infusion 100 mL/hr at 05/13/22 0850     LOS: 4 days   Time spent: 25  Rhetta Mura, MD [facility] To contact the attending provider between 7A-7P or the covering provider during after hours 7P-7A, please log into the web site [email or web address] and access using universal [place] password for that web site. If you do not have the password, please call the hospital operator.  05/13/2022, 1:48 PM

## 2022-05-13 NOTE — Care Management Important Message (Signed)
Important Message  Patient Details  Name: Cynthia Perry MRN: [id number] Date of Birth: 11-Apr-1945   Medicare Important Message Given:  Yes     Sherilyn Banker 05/13/2022, 10:30 AM

## 2022-05-13 NOTE — Plan of Care (Signed)

## 2022-05-13 NOTE — Evaluation (Signed)
Occupational Therapy Evaluation Patient Details Name: Cynthia Perry MRN: [id number] DOB: [birth year] Today's Date: 05/13/2022   History of Present Illness 77 y.o. female presents to [facility] hospital on 05/09/2022 with loose watery stools, AMS, hypotension. Pt with recent UTI on bactrim. PMH includes ulcerative colitis, anxiety, depression, alzheimer's disease, HTN, GAD.   Clinical Impression   Pt awakened for evaluation. She typically walks with intermittent hand held guidance in her home and is assisted for some aspects of ADLs and all IADLs. Pt presents with persistent diarrhea, generalized weakness and impaired dynamic standing balance requiring min assist to stand for pericare and linen change. Pt requires set up to total assist for ADLs. Recommending home health OT upon discharge.      Recommendations for follow up therapy are one component of a multi-disciplinary discharge planning process, led by the attending physician.  Recommendations may be updated based on patient status, additional functional criteria and insurance authorization.   Assistance Recommended at Discharge Frequent or constant Supervision/Assistance  Patient can return home with the following A little help with walking and/or transfers;A lot of help with bathing/dressing/bathroom;Assistance with cooking/housework;Direct supervision/assist for medications management;Direct supervision/assist for financial management;Assist for transportation;Help with stairs or ramp for entrance    Functional Status Assessment  Patient has had a recent decline in their functional status and demonstrates the ability to make significant improvements in function in a reasonable and predictable amount of time.  Equipment Recommendations  None recommended by OT    Recommendations for Other Services       Precautions / Restrictions Precautions Precautions: Fall Precaution Comments: persistent diarrhea Restrictions Weight Bearing Restrictions:  No      Mobility Bed Mobility Overal bed mobility: Needs Assistance Bed Mobility: Supine to Sit, Sit to Supine     Supine to sit: HOB elevated, Min assist Sit to supine: Mod assist   General bed mobility comments: assist to raise trunk and for LEs back into bed    Transfers Overall transfer level: Needs assistance Equipment used: 1 person hand held assist Transfers: Sit to/from Stand, Bed to chair/wheelchair/BSC Sit to Stand: Min assist           General transfer comment: steadying assist      Balance Overall balance assessment: Needs assistance   Sitting balance-Leahy Scale: Good     Standing balance support: No upper extremity supported, During functional activity Standing balance-Leahy Scale: Fair Standing balance comment: static                           ADL either performed or assessed with clinical judgement   ADL Overall ADL's : Needs assistance/impaired Eating/Feeding: Minimal assistance;Bed level   Grooming: Wash/dry hands;Wash/dry face;Sitting;Supervision/safety;Brushing hair;Maximal assistance   Upper Body Bathing: Moderate assistance;Sitting   Lower Body Bathing: Sit to/from stand;Maximal assistance   Upper Body Dressing : Minimal assistance;Sitting   Lower Body Dressing: Sit to/from stand;Maximal assistance   Toilet Transfer: Minimal assistance;Stand-pivot;BSC/3in1   Toileting- Clothing Manipulation and Hygiene: Total assistance;Sit to/from stand               Vision Patient Visual Report: No change from baseline       Perception     Praxis      Pertinent Vitals/Pain Pain Assessment Pain Assessment: Faces Faces Pain Scale: No hurt     Hand Dominance Right   Extremity/Trunk Assessment Upper Extremity Assessment Upper Extremity Assessment: Generalized weakness   Lower Extremity Assessment Lower Extremity Assessment: Defer to  PT evaluation   Cervical / Trunk Assessment Cervical / Trunk Assessment: Kyphotic    Communication Communication Communication: No difficulties   Cognition Arousal/Alertness: Awake/alert Behavior During Therapy: Flat affect Overall Cognitive Status: History of cognitive impairments - at baseline                                 General Comments: advanced dementia at baseline, pt is oriented to self, history provided by family     General Comments  VSS on RA    Exercises     Shoulder Instructions      Home Living Family/patient expects to be discharged to:: Private residence Living Arrangements: Spouse/significant other Available Help at Discharge: Family;Available 24 hours/day Type of Home: House Home Access: Stairs to enter     Home Layout: Two level         Bathroom Toilet: Standard     Home Equipment: BSC/3in1;Shower seat;Grab bars - tub/shower;Grab bars - toilet          Prior Functioning/Environment Prior Level of Function : Needs assist             Mobility Comments: pt is able to ambulate to bathroom on her own, at times requires a hand hold for guidance ADLs Comments: assist and setup for most ADLs, dependent for IADLs        OT Problem List: Decreased strength;Decreased activity tolerance;Impaired balance (sitting and/or standing);Decreased cognition;Decreased safety awareness      OT Treatment/Interventions: Self-care/ADL training;DME and/or AE instruction;Therapeutic activities;Cognitive remediation/compensation    OT Goals(Current goals can be found in the care plan section) Acute Rehab OT Goals OT Goal Formulation: Patient unable to participate in goal setting Time For Goal Achievement: 05/27/22 Potential to Achieve Goals: Good ADL Goals Pt Will Perform Grooming: with supervision;standing Pt Will Perform Upper Body Dressing: with set-up;sitting Pt Will Perform Lower Body Dressing: sit to/from stand;with min assist Pt Will Transfer to Toilet: with supervision;ambulating;bedside commode Pt Will Perform  Toileting - Clothing Manipulation and hygiene: with supervision;sit to/from stand  OT Frequency: Min 2X/week    Co-evaluation              AM-PAC OT "6 Clicks" Daily Activity     Outcome Measure Help from another person eating meals?: A Little Help from another person taking care of personal grooming?: A Lot Help from another person toileting, which includes using toliet, bedpan, or urinal?: Total Help from another person bathing (including washing, rinsing, drying)?: A Lot Help from another person to put on and taking off regular upper body clothing?: A Little Help from another person to put on and taking off regular lower body clothing?: A Lot 6 Click Score: 13   End of Session    Activity Tolerance: Treatment limited secondary to medical complications (Comment) (diarrhea) Patient left: in bed;with call bell/phone within reach;with bed alarm set  OT Visit Diagnosis: Unsteadiness on feet (R26.81);Muscle weakness (generalized) (M62.81);Other symptoms and signs involving cognitive function                Time: 1231-1250 OT Time Calculation (min): 19 min Charges:  OT General Charges $OT Visit: 1 Visit OT Evaluation $OT Eval Moderate Complexity: 1 Mod  Berna Spare, OTR/L Acute Rehabilitation Services Office: [phone]  Evern Bio 05/13/2022, 2:02 PM

## 2022-05-13 NOTE — Evaluation (Signed)
Physical Therapy Evaluation Patient Details Name: Cynthia Perry MRN: [id number] DOB: 08-25-1945 Today's Date: 05/13/2022  History of Present Illness  77 y.o. female presents to [facility] hospital on 05/09/2022 with loose watery stools, AMS, hypotension. Pt with recent UTI on bactrim. PMH includes ulcerative colitis, anxiety, depression, alzheimer's disease, HTN, GAD.  Clinical Impression  Pt presents to PT with deficits in functional mobility, gait, balance, power, endurance, cognition. Pt has advanced dementia, is oriented to self. At baseline pt is able to ambulate with PRN hand hold for guidance. She requires setup for most ADLs and assistance for all IADLs. Pt is able to stand with limited to no physical assistance at this time, ambulation is deferred due to persistent diarrhea. Pt will benefit from frequent mobilization during this admission to improve activity tolerance and to avoid deconditioning. PT and pt's daughter are hopeful the pt will progress and discharge home with continued family support.       Recommendations for follow up therapy are one component of a multi-disciplinary discharge planning process, led by the attending physician.  Recommendations may be updated based on patient status, additional functional criteria and insurance authorization.  Follow Up Recommendations       Assistance Recommended at Discharge Intermittent Supervision/Assistance  Patient can return home with the following  A little help with walking and/or transfers;A lot of help with bathing/dressing/bathroom;Assistance with cooking/housework;Assistance with feeding;Direct supervision/assist for medications management;Direct supervision/assist for financial management;Assist for transportation;Help with stairs or ramp for entrance    Equipment Recommendations None recommended by PT  Recommendations for Other Services       Functional Status Assessment Patient has had a recent decline in their functional status  and demonstrates the ability to make significant improvements in function in a reasonable and predictable amount of time.     Precautions / Restrictions Precautions Precautions: Fall Precaution Comments: persistent diarrhea Restrictions Weight Bearing Restrictions: No      Mobility  Bed Mobility Overal bed mobility: Needs Assistance Bed Mobility: Supine to Sit, Sit to Supine     Supine to sit: HOB elevated, Min assist Sit to supine: Mod assist        Transfers Overall transfer level: Needs assistance Equipment used: 1 person hand held assist Transfers: Sit to/from Stand Sit to Stand: Min assist, Min guard           General transfer comment: minA initially progressing to minG    Ambulation/Gait Ambulation/Gait assistance:  (deferred 2/2 persistent diarrhea)                Stairs            Wheelchair Mobility    Modified Rankin (Stroke Patients Only)       Balance Overall balance assessment: Needs assistance Sitting-balance support: No upper extremity supported, Feet supported Sitting balance-Leahy Scale: Good     Standing balance support: No upper extremity supported, During functional activity Standing balance-Leahy Scale: Fair                               Pertinent Vitals/Pain Pain Assessment Pain Assessment: PAINAD Breathing: normal Negative Vocalization: none Facial Expression: smiling or inexpressive Body Language: relaxed Consolability: no need to console PAINAD Score: 0    Home Living Family/patient expects to be discharged to:: Private residence Living Arrangements: Spouse/significant other Available Help at Discharge: Family;Available 24 hours/day Type of Home: House Home Access: Stairs to enter  Home Layout: Two level Home Equipment: BSC/3in1;Shower seat;Grab bars - tub/shower;Grab bars - toilet      Prior Function Prior Level of Function : Needs assist             Mobility Comments: pt is  able to ambulate to bathroom on her own, at times requires a hand hold for guidance ADLs Comments: assist and setup for most ADLs, dependent for IADLs     Hand Dominance        Extremity/Trunk Assessment   Upper Extremity Assessment Upper Extremity Assessment: Generalized weakness    Lower Extremity Assessment Lower Extremity Assessment: Generalized weakness    Cervical / Trunk Assessment Cervical / Trunk Assessment: Kyphotic  Communication   Communication: No difficulties  Cognition Arousal/Alertness: Awake/alert Behavior During Therapy: Flat affect Overall Cognitive Status: History of cognitive impairments - at baseline                                 General Comments: advanced dementia at baseline, pt is oriented to self        General Comments General comments (skin integrity, edema, etc.): VSS on RA    Exercises     Assessment/Plan    PT Assessment Patient needs continued PT services  PT Problem List Decreased strength;Decreased balance;Decreased activity tolerance;Decreased mobility;Decreased cognition;Decreased knowledge of use of DME;Decreased safety awareness;Decreased knowledge of precautions       PT Treatment Interventions DME instruction;Gait training;Stair training;Functional mobility training;Therapeutic activities;Therapeutic exercise;Balance training;Neuromuscular re-education;Patient/family education    PT Goals (Current goals can be found in the Care Plan section)  Acute Rehab PT Goals Patient Stated Goal: to resolve colitis and return home PT Goal Formulation: With family Time For Goal Achievement: 05/27/22 Potential to Achieve Goals: Fair    Frequency Min 3X/week     Co-evaluation               AM-PAC PT "6 Clicks" Mobility  Outcome Measure Help needed turning from your back to your side while in a flat bed without using bedrails?: A Little Help needed moving from lying on your back to sitting on the side of a flat  bed without using bedrails?: A Little Help needed moving to and from a bed to a chair (including a wheelchair)?: A Little Help needed standing up from a chair using your arms (e.g., wheelchair or bedside chair)?: A Little Help needed to walk in hospital room?: Total Help needed climbing 3-5 steps with a railing? : Total 6 Click Score: 14    End of Session Equipment Utilized During Treatment: Gait belt Activity Tolerance: Patient tolerated treatment well Patient left: in bed;with call bell/phone within reach;with bed alarm set Nurse Communication: Mobility status PT Visit Diagnosis: Other abnormalities of gait and mobility (R26.89);Muscle weakness (generalized) (M62.81)    Time: 2956-2130 PT Time Calculation (min) (ACUTE ONLY): 18 min   Charges:   PT Evaluation $PT Eval Low Complexity: 1 Low          Arlyss Gandy, PT, DPT Acute Rehabilitation Office [phone]   Arlyss Gandy 05/13/2022, 10:25 AM

## 2022-05-14 DIAGNOSIS — G9341 Metabolic encephalopathy: Secondary | ICD-10-CM | POA: Diagnosis not present

## 2022-05-14 LAB — CBC
HCT: 35.1 % — ABNORMAL LOW (ref 36.0–46.0)
Hemoglobin: 11.2 g/dL — ABNORMAL LOW (ref 12.0–15.0)
MCH: 25.8 pg — ABNORMAL LOW (ref 26.0–34.0)
MCHC: 31.9 g/dL (ref 30.0–36.0)
MCV: 80.9 fL (ref 80.0–100.0)
Platelets: 222 10*3/uL (ref 150–400)
RBC: 4.34 MIL/uL (ref 3.87–5.11)
RDW: 17 % — ABNORMAL HIGH (ref 11.5–15.5)
WBC: 7.2 10*3/uL (ref 4.0–10.5)
nRBC: 0 % (ref 0.0–0.2)

## 2022-05-14 LAB — CULTURE, BLOOD (ROUTINE X 2)
Culture: NO GROWTH
Special Requests: ADEQUATE

## 2022-05-14 LAB — COMPREHENSIVE METABOLIC PANEL
ALT: 16 U/L (ref 0–44)
AST: 23 U/L (ref 15–41)
Albumin: 2.2 g/dL — ABNORMAL LOW (ref 3.5–5.0)
Alkaline Phosphatase: 61 U/L (ref 38–126)
Anion gap: 5 (ref 5–15)
BUN: 5 mg/dL — ABNORMAL LOW (ref 8–23)
CO2: 27 mmol/L (ref 22–32)
Calcium: 9.6 mg/dL (ref 8.9–10.3)
Chloride: 105 mmol/L (ref 98–111)
Creatinine, Ser: 0.53 mg/dL (ref 0.44–1.00)
GFR, Estimated: >60 mL/min (ref >60)
Glucose, Bld: 101 mg/dL — ABNORMAL HIGH (ref 70–99)
Potassium: 4.6 mmol/L (ref 3.5–5.1)
Sodium: 137 mmol/L (ref 135–145)
Total Bilirubin: 0.4 mg/dL (ref 0.3–1.2)
Total Protein: 5.5 g/dL — ABNORMAL LOW (ref 6.5–8.1)

## 2022-05-14 LAB — MAGNESIUM: Magnesium: 2.1 mg/dL (ref 1.7–2.4)

## 2022-05-14 LAB — GLUCOSE, CAPILLARY: Glucose-Capillary: 170 mg/dL — ABNORMAL HIGH (ref 70–99)

## 2022-05-14 MED ORDER — LOPERAMIDE HCL 2 MG PO CAPS
4.0000 mg | ORAL_CAPSULE | ORAL | Status: DC
  Administered 2022-05-14 – 2022-05-15 (×5): 4 mg via ORAL
  Filled 2022-05-14 (×6): qty 2

## 2022-05-14 MED ORDER — PREDNISONE 50 MG PO TABS: 50.0000 mg | ORAL_TABLET | Freq: Every day | ORAL | Status: DC

## 2022-05-14 MED ORDER — BUDESONIDE 3 MG PO CPEP
9.0000 mg | ORAL_CAPSULE | Freq: Every day | ORAL | Status: DC
  Administered 2022-05-14: 9 mg via ORAL
  Filled 2022-05-14 (×2): qty 3

## 2022-05-14 NOTE — Progress Notes (Addendum)
PROGRESS NOTE   Cynthia Perry  UJW:[id number] DOB: [birth year] DOA: 05/09/2022 PCP: Mliss Sax, MD  Brief Narrative:   77 year old white female Known ulcerative colitis follows with [facility] gastroenterology Dr. Bosie Clos on mesalamine Hyperthyroid in the past  Severe dementia with mild delirium/agitation MoCA testing 01/2022 was 7/30 follows with [facility] neurology and is on Namenda Generalized anxiety B12 deficiency  Recent UTI treated by PCP with Bactrim resulting in InSu and diarrhea-became more aggressive at home confused and lethargic 5/7 CCM consulted secondary to refractory hypotension thought to be secondary to either hypovolemia or septic shock-started on norepinephrine/stress dosing of steroids PICC line started  Hospital-Problem based course  Undifferentiated shock Blood pressure is better patient transferred to [facility] 5/9--saline lock 5/11 Steroids as below   Ulcerative colitis continue mesalamine 2.4 daily, discussed case 5/11 Dr. Lorenso Quarry of GI --she recommends very slow taper over 6 weeks of Entocort preferentially >prednisone which was started at 9 mg and taper every 2 weeks down to 3 mg until can be followed by Dr. 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 replacement of potassium 5/10 as well as IV magnesium-now both are better Repeat labs in 48 hours  Therapy to eval today Doesn't need sitter unless aggressive D/c picc prior to d/c from hospital   DVT prophylaxis: scd Code Status: DNR confirmed Family Communication: called daughter Clydie Braun Talley--encouraged family to come and be with patient and help her eat Disposition:  Status is: Inpatient Remains inpatient appropriate because:   Therapy does recommend home health if all remains stable  Subjective: Incoherent pleasantly confused no distress Still having some stools but none in the diapers today  Objective: Vitals:   05/13/22 0718 05/13/22 1659 05/13/22 2103 05/14/22 0605  BP: 110/78 (!) 136/92 (!) 143/91 (!) 139/90  Pulse: 87 (!) 101 (!) 108 (!) 109  Resp: 16 17 19 20   Temp: 98.1 F (36.7 C) 98.1 F (36.7 C) 98.1 F (36.7 C) 98.5 F (36.9 C)  TempSrc: Oral Oral Oral Oral  SpO2: 100% 98% 100% 100%  Weight:      Height:        Intake/Output Summary (Last 24 hours) at 05/14/2022 1437 Last data filed at 05/14/2022 0309 Gross per 24 hour  Intake 1026 ml  Output --  Net 1026 ml    Filed Weights   05/09/22 1822 05/12/22 0539  Weight: 46.9 kg 55.1 kg    Examination:  Cachectic wf  Cta b no added sound Abdomen nontender no rebound No le edema slight tachy  Data Reviewed: personally reviewed   CBC    Component Value Date/Time   WBC 7.2 05/14/2022 0028   RBC 4.34 05/14/2022 0028   HGB 11.2 (L) 05/14/2022 0028   HCT  35.1 (L) 05/14/2022 0028   PLT 222 05/14/2022 0028   MCV 80.9 05/14/2022 0028   MCH 25.8 (L) 05/14/2022 0028   MCHC 31.9 05/14/2022 0028   RDW 17.0 (H) 05/14/2022 0028   LYMPHSABS 0.4 (L) 05/10/2022 0244   MONOABS 0.7 05/10/2022 0244   EOSABS 0.0 05/10/2022 0244   BASOSABS 0.0  05/10/2022 0244      Latest Ref Rng & Units 05/14/2022   12:28 AM 05/13/2022    2:56 AM 05/11/2022    3:32 AM  CMP  Glucose 70 - 99 mg/dL 161  096  045   BUN 8 - 23 mg/dL 5  8  9    Creatinine 0.44 - 1.00 mg/dL 4.09  8.11  9.14   Sodium 135 - 145 mmol/L 137  136  137   Potassium 3.5 - 5.1 mmol/L 4.6  2.8  4.0   Chloride 98 - 111 mmol/L 105  103  107   CO2 22 - 32 mmol/L 27  28  25    Calcium 8.9 - 10.3 mg/dL 9.6  9.7  9.7   Total Protein 6.5 - 8.1 g/dL 5.5  4.7  5.0   Total Bilirubin 0.3 - 1.2 mg/dL 0.4  0.3  0.5   Alkaline Phos 38 - 126 U/L 61  61  52   AST 15 - 41 U/L 23  14  12    ALT 0 - 44 U/L 16  9  8       Radiology Studies: No results found.   Scheduled Meds:  acidophilus  1 capsule Oral TID   budesonide  9 mg Oral Daily   Chlorhexidine Gluconate Cloth  6 each Topical Daily   Gerhardt's butt cream  1 Application Topical BID   heparin  5,000 Units Subcutaneous BID   loperamide  4 mg Oral Q4H   memantine  5 mg Oral Daily   mesalamine  2.4 g Oral Daily   multivitamin with minerals  1 tablet Oral Daily   potassium chloride  40 mEq Oral Daily   QUEtiapine  12.5 mg Oral QHS   sodium chloride flush  10-40 mL Intracatheter Q12H   Continuous Infusions:  sodium chloride 250 mL (05/11/22 1000)     LOS: 5 days   Time spent: 30  Rhetta Mura, MD [facility] To contact the attending provider between 7A-7P or the covering provider during after hours 7P-7A, please log into the web site [email or web address] and access using universal [place] password for that web site. If you do not have the password, please call the hospital operator.  05/14/2022, 2:37 PM

## 2022-05-14 NOTE — Progress Notes (Signed)
Physical Therapy Treatment Patient Details Name: Cynthia Perry MRN: [id number] DOB: 07/10/1945 Today's Date: 05/14/2022   History of Present Illness 77 y.o. female presents to [facility] hospital on 05/09/2022 with loose watery stools, AMS, hypotension. Pt with recent UTI on bactrim. PMH includes ulcerative colitis, anxiety, depression, alzheimer's disease, HTN, GAD.    PT Comments    Pt with poor progress towards acute goals this session, with significant regression from previous session, needing increased assist for all mobility. Pt greeted in R sidelying and requiring max A to roll to L sidelying for peri-care. Pt with poor initiation to come to sitting EOB, despite simple multimodal cues, pt needing up to mod A. Once sitting EOB with pt R lateral lean and trunk flexed anterior with pt unable to correct. Pt able to come to stand with max A with HHA with pt demonstrating significant trunk flexion, almost 90 degrees with gaze on floor, with pt unable to elevate trunk. Pt legs jumping/spasming in standing, with knees flexing, gait away from EOB deferred for pt safety. Pt continues to benefit from skilled PT services to progress toward functional mobility goals.  Continue to be hopeful the pt will progress and discharge home with continued family support.    Recommendations for follow up therapy are one component of a multi-disciplinary discharge planning process, led by the attending physician.  Recommendations may be updated based on patient status, additional functional criteria and insurance authorization.  Follow Up Recommendations       Assistance Recommended at Discharge Intermittent Supervision/Assistance  Patient can return home with the following A little help with walking and/or transfers;A lot of help with bathing/dressing/bathroom;Assistance with cooking/housework;Assistance with feeding;Direct supervision/assist for medications management;Direct supervision/assist for financial  management;Assist for transportation;Help with stairs or ramp for entrance   Equipment Recommendations  None recommended by PT    Recommendations for Other Services       Precautions / Restrictions Precautions Precautions: Fall Precaution Comments: persistent diarrhea Restrictions Weight Bearing Restrictions: No     Mobility  Bed Mobility Overal bed mobility: Needs Assistance Bed Mobility: Supine to Sit, Sit to Supine, Rolling Rolling: Max assist   Supine to sit: HOB elevated, Mod assist Sit to supine: Max assist   General bed mobility comments: assist for all aspects, pt with poor initiation to EOB despite simple mutimodlal cues, pt in R sideying on arrival, max A to roll to L sidelying    Transfers Overall transfer level: Needs assistance Equipment used: 1 person hand held assist Transfers: Sit to/from Stand Sit to Stand: Max assist           General transfer comment: verbal cues for initiation, increased time pt able to clear hips from bed with max A, unable to elevate trunk with pt flexed at almost 90 degrees and gaze on floor, pt unable to lift trunk, max assist to maintain standing balance with pt leaning laterally R into this PTA, legs jumping/twitching in standing    Ambulation/Gait               General Gait Details: did not progress away from EOB for pt safetty as pt with flexed posture and jumping/spams in LEs with knees flexing each spasm needing max A to maintain standing   Stairs             Wheelchair Mobility    Modified Rankin (Stroke Patients Only)       Balance Overall balance assessment: Needs assistance Sitting-balance support: No upper extremity supported, Feet supported  Sitting balance-Leahy Scale: Poor Sitting balance - Comments: strong R lateral lean with trunk VERY flexed   Standing balance support: Single extremity supported, Reliant on assistive device for balance Standing balance-Leahy Scale: Zero Standing balance  comment: max A to maintain static standing with HHA and pt with R lateral lean into this PTA, trunk flexed to almost 90 degress throughout with pt unable to raise                            Cognition Arousal/Alertness: Awake/alert Behavior During Therapy: Flat affect Overall Cognitive Status: History of cognitive impairments - at baseline                                 General Comments: advanced dementia, oriented to self only, follows simple commands with increased time        Exercises      General Comments General comments (skin integrity, edema, etc.): VSS on RA, RN present throughout session      Pertinent Vitals/Pain Pain Assessment Pain Assessment: Faces Faces Pain Scale: No hurt Pain Intervention(s): Monitored during session    Home Living                          Prior Function            PT Goals (current goals can now be found in the care plan section) Acute Rehab PT Goals PT Goal Formulation: With family Time For Goal Achievement: 05/27/22 Progress towards PT goals: Not progressing toward goals - comment    Frequency    Min 3X/week      PT Plan      Co-evaluation              AM-PAC PT "6 Clicks" Mobility   Outcome Measure  Help needed turning from your back to your side while in a flat bed without using bedrails?: A Lot Help needed moving from lying on your back to sitting on the side of a flat bed without using bedrails?: A Lot Help needed moving to and from a bed to a chair (including a wheelchair)?: Total Help needed standing up from a chair using your arms (e.g., wheelchair or bedside chair)?: Total Help needed to walk in hospital room?: Total Help needed climbing 3-5 steps with a railing? : Total 6 Click Score: 8    End of Session Equipment Utilized During Treatment: Gait belt Activity Tolerance: Patient tolerated treatment well;Other (comment) (limited by cognition) Patient left: in bed;with  call bell/phone within reach;with bed alarm set;with nursing/sitter in room Nurse Communication: Mobility status PT Visit Diagnosis: Other abnormalities of gait and mobility (R26.89);Muscle weakness (generalized) (M62.81)     Time: 4782-9562 PT Time Calculation (min) (ACUTE ONLY): 20 min  Charges:  $Therapeutic Activity: 8-22 mins                     Lakrista Scaduto R. PTA Acute Rehabilitation Services Office: [phone]    Catalina Antigua 05/14/2022, 2:28 PM

## 2022-05-15 DIAGNOSIS — G9341 Metabolic encephalopathy: Secondary | ICD-10-CM | POA: Diagnosis not present

## 2022-05-15 LAB — COMPREHENSIVE METABOLIC PANEL
ALT: 21 U/L (ref 0–44)
AST: 24 U/L (ref 15–41)
Albumin: 2 g/dL — ABNORMAL LOW (ref 3.5–5.0)
Alkaline Phosphatase: 67 U/L (ref 38–126)
Anion gap: 10 (ref 5–15)
BUN: 6 mg/dL — ABNORMAL LOW (ref 8–23)
CO2: 21 mmol/L — ABNORMAL LOW (ref 22–32)
Calcium: 9.6 mg/dL (ref 8.9–10.3)
Chloride: 100 mmol/L (ref 98–111)
Creatinine, Ser: 0.5 mg/dL (ref 0.44–1.00)
GFR, Estimated: >60 mL/min (ref >60)
Glucose, Bld: 122 mg/dL — ABNORMAL HIGH (ref 70–99)
Potassium: 4.5 mmol/L (ref 3.5–5.1)
Sodium: 131 mmol/L — ABNORMAL LOW (ref 135–145)
Total Bilirubin: 0.4 mg/dL (ref 0.3–1.2)
Total Protein: 5.4 g/dL — ABNORMAL LOW (ref 6.5–8.1)

## 2022-05-15 LAB — CBC
HCT: 35.2 % — ABNORMAL LOW (ref 36.0–46.0)
Hemoglobin: 11.4 g/dL — ABNORMAL LOW (ref 12.0–15.0)
MCH: 26.6 pg (ref 26.0–34.0)
MCHC: 32.4 g/dL (ref 30.0–36.0)
MCV: 82.1 fL (ref 80.0–100.0)
Platelets: 212 10*3/uL (ref 150–400)
RBC: 4.29 MIL/uL (ref 3.87–5.11)
RDW: 17 % — ABNORMAL HIGH (ref 11.5–15.5)
WBC: 10.1 10*3/uL (ref 4.0–10.5)
nRBC: 0 % (ref 0.0–0.2)

## 2022-05-15 MED ORDER — RISAQUAD PO CAPS
1.0000 | ORAL_CAPSULE | Freq: Three times a day (TID) | ORAL | Status: DC
  Administered 2022-05-15 – 2022-05-25 (×28): 1 via ORAL
  Filled 2022-05-15 (×29): qty 1

## 2022-05-15 MED ORDER — PREDNISONE 5 MG/5ML PO SOLN
40.0000 mg | Freq: Every day | ORAL | Status: DC
  Administered 2022-05-16: 40 mg via ORAL
  Filled 2022-05-15: qty 40

## 2022-05-15 MED ORDER — PREDNISONE 20 MG PO TABS: 20.0000 mg | ORAL_TABLET | Freq: Every day | ORAL | Status: DC

## 2022-05-15 MED ORDER — OPIUM 10 MG/ML (1%) PO TINC
2.0000 mL | Freq: Four times a day (QID) | ORAL | Status: DC | PRN
  Administered 2022-05-16: 20 mg via ORAL
  Filled 2022-05-15: qty 2

## 2022-05-15 MED ORDER — LOPERAMIDE HCL 1 MG/7.5ML PO SUSP: 4.0000 mg | ORAL | Status: DC | PRN

## 2022-05-15 MED ORDER — MESALAMINE 1000 MG RE SUPP
1000.0000 mg | Freq: Every day | RECTAL | Status: DC
  Administered 2022-05-21 – 2022-05-24 (×4): 1000 mg via RECTAL
  Filled 2022-05-15 (×11): qty 1

## 2022-05-15 MED ORDER — LACTINEX PO CHEW
1.0000 | CHEWABLE_TABLET | Freq: Three times a day (TID) | ORAL | Status: DC
  Filled 2022-05-15: qty 1

## 2022-05-15 NOTE — Progress Notes (Signed)
PROGRESS NOTE   Cynthia Perry  WJX:[id number] DOB: April 15, 1945 DOA: 05/09/2022 PCP: Mliss Sax, MD  Brief Narrative:   77 year old white female Known ulcerative colitis follows with [facility] gastroenterology Dr. Bosie Clos on mesalamine Hyperthyroid in the past  Severe dementia with mild delirium/agitation MoCA testing 01/2022 was 7/30 follows with [facility] neurology and is on Namenda Generalized anxiety B12 deficiency  Recent UTI treated by PCP with Bactrim resulting in InSu and diarrhea-became more aggressive at home confused and lethargic 5/7 CCM consulted secondary to refractory hypotension thought to be secondary to either hypovolemia or septic shock-started on norepinephrine/stress dosing of steroids PICC line started  Hospital-Problem based course  Undifferentiated shock Blood pressure is better patient transferred to [facility] 5/9--saline lock 5/11 Steroids as below   Ulcerative colitis Case discussed with Dr. 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 replacement of potassium 5/10 as well as IV magnesium-now both are  better Repeat labs in 48 hours  Therapy to eval today Doesn't need sitter unless aggressive D/c picc prior to d/c from hospital   DVT prophylaxis: scd Code Status: DNR confirmed Family Communication: called daughter Clydie Braun Talley--encouraged family to come and be with patient and help her eat Disposition:  Status is: Inpatient Remains inpatient appropriate because:   Patient needing likely higher level of care-PT reengaged for reassessment  Subjective:  Continues to have diarrhea requiring Flexi-Seal No pain no fever no nausea eating some  Objective: Vitals:   05/14/22 1945 05/15/22 0500 05/15/22 0537 05/15/22 1025  BP: 103/69  93/60 (!) 96/58  Pulse: (!) 109  98 91  Resp: 18  18 16   Temp: 100.1 F (37.8 C)  98.3 F (36.8 C) 97.7 F (36.5 C)  TempSrc: Oral  Axillary Oral  SpO2: 97%  98% 99%  Weight:  43.5 kg    Height:        Intake/Output Summary (Last 24 hours) at 05/15/2022 1208 Last data filed at 05/14/2022 2300 Gross per 24 hour  Intake 150 ml  Output --  Net 150 ml    Filed Weights   05/09/22 1822 05/12/22 0539 05/15/22 0500  Weight: 46.9 kg 55.1 kg 43.5 kg    Examination:  Cachectic wf some confusion No icterus no pallor Chest is clear Cachectic Abdomen soft No lower extremity edema  Data Reviewed: personally reviewed   CBC    Component Value Date/Time   WBC 10.1 05/15/2022 0114   RBC 4.29 05/15/2022 0114   HGB 11.4 (L) 05/15/2022 0114   HCT 35.2 (L) 05/15/2022 0114   PLT 212 05/15/2022 0114   MCV 82.1 05/15/2022 0114   MCH 26.6 05/15/2022 0114   MCHC 32.4 05/15/2022 0114   RDW 17.0 (H) 05/15/2022 0114  LYMPHSABS 0.4 (L) 05/10/2022 0244   MONOABS 0.7 05/10/2022 0244   EOSABS 0.0 05/10/2022 0244   BASOSABS 0.0 05/10/2022 0244      Latest Ref Rng & Units 05/15/2022    1:14 AM 05/14/2022   12:28 AM 05/13/2022    2:56 AM  CMP  Glucose 70 - 99 mg/dL 161  096  045   BUN 8 - 23 mg/dL 6  5  8    Creatinine 0.44 - 1.00 mg/dL 4.09  8.11   9.14   Sodium 135 - 145 mmol/L 131  137  136   Potassium 3.5 - 5.1 mmol/L 4.5  4.6  2.8   Chloride 98 - 111 mmol/L 100  105  103   CO2 22 - 32 mmol/L 21  27  28    Calcium 8.9 - 10.3 mg/dL 9.6  9.6  9.7   Total Protein 6.5 - 8.1 g/dL 5.4  5.5  4.7   Total Bilirubin 0.3 - 1.2 mg/dL 0.4  0.4  0.3   Alkaline Phos 38 - 126 U/L 67  61  61   AST 15 - 41 U/L 24  23  14    ALT 0 - 44 U/L 21  16  9       Radiology Studies: No results found.   Scheduled Meds:  acidophilus  1 capsule Oral TID WC   Chlorhexidine Gluconate Cloth  6 each Topical Daily   Gerhardt's butt cream  1 Application Topical BID   heparin  5,000 Units Subcutaneous BID   memantine  5 mg Oral Daily   mesalamine  2.4 g Oral Daily   multivitamin with minerals  1 tablet Oral Daily   potassium chloride  40 mEq Oral Daily   [START ON 05/30/2022] predniSONE  20 mg Oral QAC breakfast   [START ON 05/16/2022] predniSONE  40 mg Oral Q breakfast   QUEtiapine  12.5 mg Oral QHS   sodium chloride flush  10-40 mL Intracatheter Q12H   Continuous Infusions:     LOS: 6 days   Time spent: 40  Rhetta Mura, MD [facility] To contact the attending provider between 7A-7P or the covering provider during after hours 7P-7A, please log into the web site [email or web address] and access using universal [place] password for that web site. If you do not have the password, please call the hospital operator.  05/15/2022, 12:08 PM

## 2022-05-16 DIAGNOSIS — G9341 Metabolic encephalopathy: Secondary | ICD-10-CM | POA: Diagnosis not present

## 2022-05-16 LAB — LACTIC ACID, PLASMA
Lactic Acid, Venous: 2.5 mmol/L (ref 0.5–1.9)
Lactic Acid, Venous: 2.4 mmol/L (ref 0.5–1.9)

## 2022-05-16 MED ORDER — SODIUM CHLORIDE 0.9 % IV BOLUS
500.0000 mL | Freq: Once | INTRAVENOUS | Status: AC
  Administered 2022-05-16: 500 mL via INTRAVENOUS

## 2022-05-16 MED ORDER — HYDROCORTISONE SOD SUC (PF) 100 MG IJ SOLR
100.0000 mg | Freq: Three times a day (TID) | INTRAMUSCULAR | Status: AC
  Administered 2022-05-16 – 2022-05-18 (×6): 100 mg via INTRAVENOUS
  Filled 2022-05-16 (×7): qty 2

## 2022-05-16 MED ORDER — SODIUM CHLORIDE 0.9 % IV SOLN: INTRAVENOUS | Status: DC

## 2022-05-16 MED ORDER — PANCRELIPASE (LIP-PROT-AMYL) 36000-114000 UNITS PO CPEP
50000.0000 [IU] | ORAL_CAPSULE | Freq: Three times a day (TID) | ORAL | Status: DC
  Administered 2022-05-16 – 2022-05-25 (×24): 48000 [IU] via ORAL
  Filled 2022-05-16 (×29): qty 1

## 2022-05-16 MED ORDER — SODIUM CHLORIDE 0.9 % IV BOLUS: 500.0000 mL | Freq: Once | INTRAVENOUS | Status: DC

## 2022-05-16 MED ORDER — SODIUM CHLORIDE 0.9 % IV BOLUS
1000.0000 mL | Freq: Once | INTRAVENOUS | Status: AC
  Administered 2022-05-16: 1000 mL via INTRAVENOUS

## 2022-05-16 MED ORDER — ALBUMIN HUMAN 25 % IV SOLN
50.0000 g | Freq: Once | INTRAVENOUS | Status: AC
  Administered 2022-05-16: 50 g via INTRAVENOUS
  Filled 2022-05-16: qty 200

## 2022-05-16 MED ORDER — SACCHAROMYCES BOULARDII 250 MG PO CAPS
250.0000 mg | ORAL_CAPSULE | Freq: Two times a day (BID) | ORAL | Status: DC
  Administered 2022-05-16 – 2022-05-25 (×18): 250 mg via ORAL
  Filled 2022-05-16 (×17): qty 1

## 2022-05-16 NOTE — Progress Notes (Signed)
PROGRESS NOTE   Cynthia Perry  EAV:[id number] DOB: [birth year] DOA: 05/09/2022 PCP: Mliss Sax, MD  Brief Narrative:   77 year old white female Known ulcerative colitis follows with [facility] gastroenterology Dr. Bosie Clos on mesalamine Hyperthyroid in the past  Severe dementia with mild delirium/agitation MoCA testing 01/2022 was 7/30 follows with [facility] neurology and is on Namenda Generalized anxiety B12 deficiency  Recent UTI treated by PCP with Bactrim resulting in InSu and diarrhea-became more aggressive at home confused and lethargic 5/7 CCM consulted secondary to refractory hypotension thought to be secondary to either hypovolemia or septic shock-started on norepinephrine/stress dosing of steroids PICC line started 5/13 recurrent hypotension responsive to 2.5 L of crystalloid  Hospital-Problem based course  Recurrent shock but not refractory to fluids 2.5 crystalloid given today with response to MAP from below 60 to about 70 Giving albumin 50 now--saline 125 cc/H for 24 hours and then reassess Lactic acidosis secondary likely to poor perfusion do not think infectious--repeat labs in a.m. See discussion below  Ulcerative colitis Case discussed with Dr. Lorenso Quarry of GI-and I have also discussed on 5/13 with Dr. 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 replacement of potassium 5/10 as well as IV magnesium-now both are better  Therapy to eval today Doesn't need sitter unless aggressive D/c picc prior to d/c from hospital   DVT prophylaxis: scd Code Status: DNR confirmed Family Communication: Long discussion with daughter bedside and on phone-have mentioned that the patient has made a decline Have mentioned that her blood pressures are low and this could be from the volume of diarrhea she is having in addition to her poor p.o. intake Daughter understands pretty well that this may be life-limiting illness although patient has rebounded in the past from similar events when she has a flare At this time I have requested that she consider discussion with the patient's husband goals of care and how aggressive to be given the patient iis frail and has overalla  poor prognosis--will ask pallaitve to see for GOals of care I will curbside CCM this evening if patient continues to be hypotensive and for them to watch although currently the MAP is 78 and the blood pressure is 102/67 after fluid repletion etc. Disposition:  Status is: Inpatient Remains inpatient appropriate because:   Patient needing likely higher level of care-PT reengaged for reassessment  Subjective:  Large amounts of stool about 3 to 400 cc in Flexi-Seal as well as persistent hypotension when on 6 N she was quite sleepy and listless at the time. Patient reevaluated when on 5 W. more awake coherent Blood pressures seem better She is getting some  albumin  Objective: Vitals:   05/16/22 1217 05/16/22 1325 05/16/22 1423 05/16/22  1619  BP: (!) 86/52 (!) 83/53 (!) 88/60   Pulse: 76  68 80  Resp:      Temp:    (!) 97.2 F (36.2 C)  TempSrc:    Axillary  SpO2:    98%  Weight:      Height:        Intake/Output Summary (Last 24 hours) at 05/16/2022 1616 Last data filed at 05/16/2022 0630 Gross per 24 hour  Intake 120 ml  Output 350 ml  Net -230 ml    Filed Weights   05/12/22 0539 05/15/22 0500 05/16/22 0424  Weight: 55.1 kg 43.5 kg 48 kg    Examination:  Cachectic wf some confusion somewhat redirectable No icterus no pallor Chest is clear Abdomen soft nt nd no rebound--flexiseal with large stool No lower extremity edema  Data Reviewed: personally reviewed   CBC    Component Value Date/Time   WBC 10.1 05/15/2022 0114   RBC 4.29 05/15/2022 0114   HGB 11.4 (L) 05/15/2022 0114   HCT 35.2 (L) 05/15/2022 0114   PLT 212 05/15/2022 0114   MCV 82.1 05/15/2022 0114   MCH 26.6 05/15/2022 0114   MCHC 32.4 05/15/2022 0114   RDW 17.0 (H) 05/15/2022 0114   LYMPHSABS 0.4 (L) 05/10/2022 0244   MONOABS 0.7 05/10/2022 0244   EOSABS 0.0 05/10/2022 0244   BASOSABS 0.0 05/10/2022 0244      Latest Ref Rng & Units 05/15/2022    1:14 AM 05/14/2022   12:28 AM 05/13/2022    2:56 AM  CMP  Glucose 70 - 99 mg/dL 161  096  045   BUN 8 - 23 mg/dL 6  5  8    Creatinine 0.44 - 1.00 mg/dL 4.09  8.11  9.14   Sodium 135 - 145 mmol/L 131  137  136   Potassium 3.5 - 5.1 mmol/L 4.5  4.6  2.8   Chloride 98 - 111 mmol/L 100  105  103   CO2 22 - 32 mmol/L 21  27  28    Calcium 8.9 - 10.3 mg/dL 9.6  9.6  9.7   Total Protein 6.5 - 8.1 g/dL 5.4  5.5  4.7   Total Bilirubin 0.3 - 1.2 mg/dL 0.4  0.4  0.3   Alkaline Phos 38 - 126 U/L 67  61  61   AST 15 - 41 U/L 24  23  14    ALT 0 - 44 U/L 21  16  9       Radiology Studies: No results found.   Scheduled Meds:  acidophilus  1 capsule Oral TID WC   Chlorhexidine Gluconate Cloth  6 each Topical Daily   Gerhardt's butt cream  1 Application Topical BID   heparin  5,000 Units  Subcutaneous BID   hydrocortisone sod succinate (SOLU-CORTEF) inj  100 mg Intravenous Q8H   lipase/protease/amylase  48,000 Units Oral TID AC   memantine  5 mg Oral Daily   mesalamine  1,000 mg Rectal QHS   [START ON 05/30/2022] predniSONE  20 mg Oral QAC breakfast   QUEtiapine  12.5 mg Oral QHS   saccharomyces boulardii  250 mg Oral BID   sodium chloride flush  10-40 mL Intracatheter Q12H   Continuous Infusions:  sodium chloride 125 mL/hr at 05/16/22 1324   sodium chloride        LOS: 7 days   Time spent: 40  Rhetta Mura, MD  [facility] To contact the attending provider between 7A-7P or the covering provider during after hours 7P-7A, please log into the web site [email or web address] and access using universal [place] password for that web site. If you do not have the password, please call the hospital operator.  05/16/2022, 4:16 PM

## 2022-05-16 NOTE — Progress Notes (Signed)
Occupational Therapy Treatment Patient Details Name: Cynthia Perry MRN: [id number] DOB: July 31, 1945 Today's Date: 05/16/2022   History of present illness 77 y.o. female presents to [facility] hospital on 05/09/2022 with loose watery stools, AMS, hypotension. Pt with recent UTI on bactrim. PMH includes ulcerative colitis, anxiety, depression, alzheimer's disease, HTN, GAD.   OT comments  Patient seen in conjunction with PTA due to decrease in ability over the weekend. Patient is now total A for all ADLs, minimal command following, and need for total A of 2 to transition EOB and to stand. Patient rigid throughout session through all extremities, therefore able to stand statically for 10-15 seconds without external assist (PTA and OT close min guard never taking hands off of patient ) and having to manually bring hips under patient to come into standing. Patient asking to sit down (most clear sentence uttered all treatment) and immediately falling asleep once placed back in bed. OT changing recommendation as patient currently demonstrates minimal rehab potential, with palliative care consult placed. OT will defer to palliative care and family decisions for discharge plans.    Recommendations for follow up therapy are one component of a multi-disciplinary discharge planning process, led by the attending physician.  Recommendations may be updated based on patient status, additional functional criteria and insurance authorization.    Assistance Recommended at Discharge Frequent or constant Supervision/Assistance  Patient can return home with the following  Two people to help with walking and/or transfers;A lot of help with bathing/dressing/bathroom;Assistance with cooking/housework;Assistance with feeding;Direct supervision/assist for financial management;Direct supervision/assist for medications management;Help with stairs or ramp for entrance;Assist for transportation   Equipment Recommendations  None recommended  by OT    Recommendations for Other Services Other (comment) (Palliative consult)    Precautions / Restrictions Precautions Precautions: Fall Precaution Comments: rectal tube Restrictions Weight Bearing Restrictions: No       Mobility Bed Mobility Overal bed mobility: Needs Assistance Bed Mobility: Supine to Sit, Sit to Supine     Supine to sit: Total assist, +2 for physical assistance Sit to supine: Total assist, +2 for physical assistance   General bed mobility comments: Pt able to initially move RLE towards EOB, however then moving LLE away from EOB despite cues and unable to follow further commands for BLE advancement. Pt requiring total A +2 with bed pad for BLE management, trunk elevation, and scooting forward at EOB.    Transfers Overall transfer level: Needs assistance Equipment used: 2 person hand held assist Transfers: Sit to/from Stand Sit to Stand: Total assist, +2 physical assistance           General transfer comment: Pt requiring total A +2 to rise from EOB and pull hips under for upright posture. Pt unable to maintain upright posture without assist, however able to stand with trunk flexed without assist. Pt requesting to return to sitting once fatigued.     Balance Overall balance assessment: Needs assistance Sitting-balance support: Feet supported, No upper extremity supported Sitting balance-Leahy Scale: Fair Sitting balance - Comments: Sitting EOB with initial assist, but able to progress to supervision.   Standing balance support: Bilateral upper extremity supported Standing balance-Leahy Scale: Zero Standing balance comment: Pt requiring assist to maintain upright posture                           ADL either performed or assessed with clinical judgement   ADL Overall ADL's : Needs assistance/impaired     Grooming: Wash/dry hands;Wash/dry  face;Sitting;Bed level;Total assistance Grooming Details (indicate cue type and reason): total A  despite multiple attempts                 Toilet Transfer: Total assistance;+2 for physical assistance;+2 for safety/equipment Toilet Transfer Details (indicate cue type and reason): simulated EOB Toileting- Clothing Manipulation and Hygiene: Total assistance;Sit to/from stand Toileting - Clothing Manipulation Details (indicate cue type and reason): rectal tube     Functional mobility during ADLs: Maximal assistance;Cueing for sequencing;Cueing for safety;Total assistance General ADL Comments: Patient unable to follow commands consistently, minimal to no initiation, now max to total A of 2 to come into standing.    Extremity/Trunk Assessment              Vision       Perception     Praxis      Cognition Arousal/Alertness: Awake/alert Behavior During Therapy:  (Pleasantly confused) Overall Cognitive Status: History of cognitive impairments - at baseline                                 General Comments: advanced dementia and HOH with limited to no response to commands        Exercises      Shoulder Instructions       General Comments VSS on RA    Pertinent Vitals/ Pain       Pain Assessment Pain Assessment: Faces Pain Score: 0-No pain  Home Living                                          Prior Functioning/Environment              Frequency  Min 2X/week        Progress Toward Goals  OT Goals(current goals can now be found in the care plan section)  Progress towards OT goals: Not progressing toward goals - comment (not following any commands consistently)  Acute Rehab OT Goals OT Goal Formulation: Patient unable to participate in goal setting Time For Goal Achievement: 05/27/22 Potential to Achieve Goals: Poor  Plan Discharge plan needs to be updated    Co-evaluation    PT/OT/SLP Co-Evaluation/Treatment: Yes Reason for Co-Treatment: For patient/therapist safety;To address functional/ADL  transfers;Complexity of the patient's impairments (multi-system involvement) PT goals addressed during session: Mobility/safety with mobility;Strengthening/ROM OT goals addressed during session: ADL's and self-care;Strengthening/ROM      AM-PAC OT "6 Clicks" Daily Activity     Outcome Measure   Help from another person eating meals?: A Lot Help from another person taking care of personal grooming?: Total Help from another person toileting, which includes using toliet, bedpan, or urinal?: Total Help from another person bathing (including washing, rinsing, drying)?: Total Help from another person to put on and taking off regular upper body clothing?: Total Help from another person to put on and taking off regular lower body clothing?: Total 6 Click Score: 7    End of Session Equipment Utilized During Treatment: Gait belt  OT Visit Diagnosis: Unsteadiness on feet (R26.81);Muscle weakness (generalized) (M62.81);Other symptoms and signs involving cognitive function   Activity Tolerance Patient limited by fatigue   Patient Left in bed;with call bell/phone within reach;with bed alarm set   Nurse Communication Mobility status        Time: 1610-9604 OT Time Calculation (min): 27 min  Charges: OT General Charges $OT Visit: 1 Visit OT Treatments $Self Care/Home Management : 8-22 mins  Pollyann Glen E. Madelaine Whipple, OTR/L Acute Rehabilitation Services [phone]   Cherlyn Cushing 05/16/2022, 1:07 PM

## 2022-05-16 NOTE — Progress Notes (Signed)
OT Cancellation Note  Patient Details Name: Cynthia Perry MRN: [id number] DOB: 1945-12-01   Cancelled Treatment:    Reason Eval/Treat Not Completed: Medical issues which prohibited therapy Patients blood pressure currently 75/47 in supine, with RN finding manual cuff to assess pressure. OT to follow back when patient is more medically stable.   Pollyann Glen E. Lareta Bruneau, OTR/L Acute Rehabilitation Services [phone]   Cherlyn Cushing 05/16/2022, 9:21 AM

## 2022-05-16 NOTE — Progress Notes (Addendum)
Physical Therapy Treatment Patient Details Name: Cynthia Perry MRN: [id number] DOB: May 12, 1945 Today's Date: 05/16/2022   History of Present Illness 77 y.o. female presents to [facility] hospital on 05/09/2022 with loose watery stools, AMS, hypotension. Pt with recent UTI on bactrim. PMH includes ulcerative colitis, anxiety, depression, alzheimer's disease, HTN, GAD.    PT Comments    Pt continuing to demonstrate regression in mobility from previous session. Pt pleasantly confused throughout session with limited ability to respond to commands. Pt repeating instructions at times with varied accuracy and gazing past therapists. Pt requiring up to total A +2 for all mobility. Pt's extremities appearing rigid throughout session and only relaxing intermittently with cues. Pt able to sit EOB for ~10 mins initially with min A to maintain balance and progressing to supervision. Attempting to stand at EOB with total A +2 and pt able to support with BLE, but unable to maintain upright posture without assist. After discussion with supervising PT Cynthia Perry, discharge recommendations have been updated to reflect pt's change in cognition. Pt continues to benefit from PT services to progress toward functional mobility goals.     Recommendations for follow up therapy are one component of a multi-disciplinary discharge planning process, led by the attending physician.  Recommendations may be updated based on patient status, additional functional criteria and insurance authorization.  Follow Up Recommendations  Can patient physically be transported by private vehicle: No    Assistance Recommended at Discharge Frequent or constant Supervision/Assistance  Patient can return home with the following A lot of help with bathing/dressing/bathroom;Assistance with cooking/housework;Assistance with feeding;Direct supervision/assist for medications management;Direct supervision/assist for financial management;Assist for  transportation;Help with stairs or ramp for entrance;Two people to help with walking and/or transfers   Equipment Recommendations  None recommended by PT    Recommendations for Other Services Other (comment) (Pallative care consult)     Precautions / Restrictions Precautions Precautions: Fall Precaution Comments: rectal tube Restrictions Weight Bearing Restrictions: No     Mobility  Bed Mobility Overal bed mobility: Needs Assistance Bed Mobility: Supine to Sit, Sit to Supine     Supine to sit: Total assist, +2 for physical assistance Sit to supine: Total assist, +2 for physical assistance   General bed mobility comments: Pt able to initially move RLE towards EOB, however then moving LLE away from EOB despite cues and unable to follow further commands for BLE advancement. Pt requiring total A +2 with bed pad for BLE management, trunk elevation, and scooting forward at EOB.    Transfers Overall transfer level: Needs assistance Equipment used: 2 person hand held assist Transfers: Sit to/from Stand Sit to Stand: Total assist, +2 physical assistance           General transfer comment: Pt requiring total A +2 to rise from EOB and pull hips under for upright posture. Pt unable to maintain upright posture without assist, however able to stand with trunk flexed without assist. Pt requesting to return to sitting once fatigued.    Ambulation/Gait               General Gait Details: unable to progress       Balance Overall balance assessment: Needs assistance Sitting-balance support: Feet supported, No upper extremity supported Sitting balance-Leahy Scale: Fair Sitting balance - Comments: Sitting EOB with initial assist, but able to progress to supervision.   Standing balance support: Bilateral upper extremity supported Standing balance-Leahy Scale: Zero Standing balance comment: Pt requiring assist to maintain upright posture  Cognition Arousal/Alertness: Awake/alert Behavior During Therapy:  (Pleasantly confused) Overall Cognitive Status: History of cognitive impairments - at baseline                                 General Comments: advanced dementia and HOH with limited to no response to commands        Exercises General Exercises - Lower Extremity Long Arc Quad: AAROM, Seated, Both, 5 reps    General Comments General comments (skin integrity, edema, etc.): VSS on RA      Pertinent Vitals/Pain Pain Assessment Pain Assessment: Faces Faces Pain Scale: No hurt     PT Goals (current goals can now be found in the care plan section) Acute Rehab PT Goals Patient Stated Goal: to resolve colitis and return home PT Goal Formulation: With family Time For Goal Achievement: 05/27/22 Potential to Achieve Goals: Fair Progress towards PT goals: Not progressing toward goals - comment (Pt limited by cognition)    Frequency    Min 3X/week      PT Plan Discharge plan needs to be updated    Co-evaluation PT/OT/SLP Co-Evaluation/Treatment: Yes Reason for Co-Treatment: For patient/therapist safety;To address functional/ADL transfers;Complexity of the patient's impairments (multi-system involvement) PT goals addressed during session: Mobility/safety with mobility;Strengthening/ROM OT goals addressed during session: ADL's and self-care;Strengthening/ROM      AM-PAC PT "6 Clicks" Mobility   Outcome Measure  Help needed turning from your back to your side while in a flat bed without using bedrails?: A Lot Help needed moving from lying on your back to sitting on the side of a flat bed without using bedrails?: Total Help needed moving to and from a bed to a chair (including a wheelchair)?: Total Help needed standing up from a chair using your arms (e.g., wheelchair or bedside chair)?: Total Help needed to walk in hospital room?: Total Help needed climbing 3-5 steps with a railing? : Total 6  Click Score: 7    End of Session Equipment Utilized During Treatment: Gait belt Activity Tolerance: Patient tolerated treatment well;Other (comment) (limited by cognition) Patient left: in bed;with call bell/phone within reach;with bed alarm set Nurse Communication: Mobility status PT Visit Diagnosis: Other abnormalities of gait and mobility (R26.89);Muscle weakness (generalized) (M62.81)     Time: 1610-9604 PT Time Calculation (min) (ACUTE ONLY): 26 min  Charges:  $Therapeutic Activity: 8-22 mins                     Johny Shock, PTA Acute Rehabilitation Services Secure Chat Preferred  [phone]    Johny Shock 05/16/2022, 1:00 PM

## 2022-05-17 DIAGNOSIS — E43 Unspecified severe protein-calorie malnutrition: Secondary | ICD-10-CM

## 2022-05-17 DIAGNOSIS — K5130 Ulcerative (chronic) rectosigmoiditis without complications: Secondary | ICD-10-CM | POA: Diagnosis not present

## 2022-05-17 DIAGNOSIS — G9341 Metabolic encephalopathy: Secondary | ICD-10-CM | POA: Diagnosis not present

## 2022-05-17 DIAGNOSIS — R197 Diarrhea, unspecified: Secondary | ICD-10-CM | POA: Diagnosis not present

## 2022-05-17 LAB — GASTROINTESTINAL PANEL BY PCR, STOOL (REPLACES STOOL CULTURE)

## 2022-05-17 LAB — COMPREHENSIVE METABOLIC PANEL
ALT: 13 U/L (ref 0–44)
AST: 13 U/L — ABNORMAL LOW (ref 15–41)
Albumin: 2.4 g/dL — ABNORMAL LOW (ref 3.5–5.0)
Alkaline Phosphatase: 45 U/L (ref 38–126)
Anion gap: 8 (ref 5–15)
BUN: 7 mg/dL — ABNORMAL LOW (ref 8–23)
CO2: 24 mmol/L (ref 22–32)
Calcium: 9.9 mg/dL (ref 8.9–10.3)
Chloride: 103 mmol/L (ref 98–111)
Creatinine, Ser: 0.54 mg/dL (ref 0.44–1.00)
GFR, Estimated: >60 mL/min (ref >60)
Glucose, Bld: 218 mg/dL — ABNORMAL HIGH (ref 70–99)
Potassium: 3.4 mmol/L — ABNORMAL LOW (ref 3.5–5.1)
Sodium: 135 mmol/L (ref 135–145)
Total Bilirubin: 0.3 mg/dL (ref 0.3–1.2)
Total Protein: 4.9 g/dL — ABNORMAL LOW (ref 6.5–8.1)

## 2022-05-17 LAB — CBC
HCT: 28.1 % — ABNORMAL LOW (ref 36.0–46.0)
Hemoglobin: 8.8 g/dL — ABNORMAL LOW (ref 12.0–15.0)
MCH: 25.7 pg — ABNORMAL LOW (ref 26.0–34.0)
MCHC: 31.3 g/dL (ref 30.0–36.0)
MCV: 82.2 fL (ref 80.0–100.0)
Platelets: 170 10*3/uL (ref 150–400)
RBC: 3.42 MIL/uL — ABNORMAL LOW (ref 3.87–5.11)
RDW: 16.6 % — ABNORMAL HIGH (ref 11.5–15.5)
WBC: 4.4 10*3/uL (ref 4.0–10.5)
nRBC: 0 % (ref 0.0–0.2)

## 2022-05-17 MED ORDER — LOPERAMIDE HCL 2 MG PO CAPS
4.0000 mg | ORAL_CAPSULE | Freq: Two times a day (BID) | ORAL | Status: DC
  Administered 2022-05-17 – 2022-05-18 (×3): 4 mg via ORAL
  Filled 2022-05-17 (×3): qty 2

## 2022-05-17 MED ORDER — POTASSIUM CHLORIDE 20 MEQ PO PACK
40.0000 meq | PACK | ORAL | Status: AC
  Administered 2022-05-17: 40 meq via ORAL
  Filled 2022-05-17: qty 2

## 2022-05-17 MED ORDER — HALOPERIDOL LACTATE 5 MG/ML IJ SOLN
2.0000 mg | Freq: Once | INTRAMUSCULAR | Status: AC
  Filled 2022-05-17: qty 1

## 2022-05-17 MED ORDER — BANATROL TF EN LIQD
60.0000 mL | Freq: Two times a day (BID) | ENTERAL | Status: DC
  Administered 2022-05-17 – 2022-05-25 (×17): 60 mL via ORAL
  Filled 2022-05-17 (×14): qty 60

## 2022-05-17 MED ORDER — HALOPERIDOL LACTATE 5 MG/ML IJ SOLN
INTRAMUSCULAR | Status: AC
  Administered 2022-05-17: 2 mg via INTRAVENOUS
  Filled 2022-05-17: qty 1

## 2022-05-17 NOTE — Progress Notes (Signed)
PROGRESS NOTE        PATIENT DETAILS Name: Cynthia Perry Age: 77 y.o. Sex: female Date of Birth: 03/31/1945 Admit Date: 05/09/2022 Admitting Physician Steffanie Dunn, DO ZOX:[id number], Talmadge Coventry, MD  Brief Summary: Patient is a 77 y.o.  female with history of ulcerative colitis-on mesalamine, advanced dementia-history of recent UTI treated with Bactrim-presented to the hospital on 5/6 with diarrhea/weakness/aggressive behavior-she was subsequently admitted to the hospitalist service.  Hospital course complicated by hypotension requiring pressors and transferred to the ICU.  See below for further details.  Significant events: 5/06>> admit to [facility] 5/07>> transferred to ICU for hypotension-low-dose pressors/stress dose steroids 5/09>> transfer to [facility] 5/13>> hypotensive-responded to IV fluid bolus-transfer to progressive care.  Significant studies: 5/06>> CXR: No active disease 5/06>> CT head: No acute intracranial abnormality.  3.8 soft tissue lesion-left pharyngeal space. 5/06>> CT abdomen/pelvis: Left hemicolon colitis  Significant microbiology data: 5/06>> stool C. difficile: Negative 5/06>> GI pathogen panel: Negative 5/06>> urine culture: E. coli 5/06>> blood culture: Negative  Procedures: None  Consults: PCCM GI  Subjective: Lying comfortably in bed-denies any chest pain or shortness of breath.  Objective: Vitals: Blood pressure 104/67, pulse 75, temperature 98 F (36.7 C), temperature source Axillary, resp. rate 14, height 5\' 3"  (1.6 m), weight 55 kg, SpO2 98 %.   Exam: Gen Exam:Alert awake-not in any distress HEENT:atraumatic, normocephalic Chest: B/L clear to auscultation anteriorly CVS:S1S2 regular Abdomen:soft non tender, non distended Extremities:no edema Neurology: Non focal Skin: no rash  Pertinent Labs/Radiology:    Latest Ref Rng & Units 05/17/2022    4:59 AM 05/15/2022    1:14 AM 05/14/2022   12:28 AM  CBC  WBC 4.0 -  10.5 K/uL 4.4  10.1  7.2   Hemoglobin 12.0 - 15.0 g/dL 8.8  40.9  81.1   Hematocrit 36.0 - 46.0 % 28.1  35.2  35.1   Platelets 150 - 400 K/uL 170  212  222     Lab Results  Component Value Date   NA 135 05/17/2022   K 3.4 (L) 05/17/2022   CL 103 05/17/2022   CO2 24 05/17/2022     Assessment/Plan: Hypovolemic shock due to diarrhea Reoccurred on 5/13-responded to IVF Continue IVF-but decrease rate Stress dose steroids See below regarding plans to control diarrhea  Diarrhea-likely UC flare Prior stool studies negative Mesalamine suppository IV steroids Creon As needed Imodium/tincture of opium for now-probably could switch to scheduled Imodium once repeat stool studies are negative. Await formal GI evaluation Limited options given advanced dementia  Asymptomatic bacteriuria Urine culture positive for E. coli-UA done on same day-not suggestive of UTI.   Given dementia-history is limited but clinical features are not suggestive of UTI.  Acute metabolic encephalopathy superimposed on advanced dementia Worsening encephalopathy-in the setting of acute hospitalization/high-dose steroids/UC flare Treat underlying UC flare Continue Namenda-low-dose Seroquel Maintain delirium precautions  Hypokalemia GI loss Replete/recheck  Normocytic anemia Secondary to acute illness No evidence of GI bleed/blood loss Follow CBC  3.8 soft tissue lesion-left pharyngeal space Seen incidentally on CT head Discussed with daughter on 5/14-we have decided to not pursue any further workup-as she would not be a candidate for surgical excision/chemo/radiation if this indeed turned out to be malignant.  Furthermore she is asymptomatic-and this is obviously a incidental finding.    Palliative care DNR Goals of care are for gentle  medical treatment-hopefully we can find a way to slow her diarrhea/control colitis-however family aware that if despite our best efforts-she continues to  decline/-hypotension etc.-we may need to consider hospice care.   Nutrition Status: Nutrition Problem: Severe Malnutrition Etiology: social / environmental circumstances (inability to self-feed) Signs/Symptoms: severe fat depletion, severe muscle depletion Interventions: Refer to RD note for recommendations  BMI/ Estimated body mass index is 21.48 kg/m as calculated from the following:   Height as of this encounter: 5\' 3"  (1.6 m).   Weight as of this encounter: 55 kg.   Code status:   Code Status: DNR   DVT Prophylaxis: heparin injection 5,000 Units Start: 05/10/22 1045 SCDs Start: 05/10/22 1610   Family Communication: Daughter-[phone]-update over the phone 5/14.   Disposition Plan: Status is: Inpatient Remains inpatient appropriate because: Severity of illness   Planned Discharge Destination:Home health   Diet: Diet Order             Diet regular Room service appropriate? Yes; Fluid consistency: Thin  Diet effective now                     Antimicrobial agents: Anti-infectives (From admission, onward)    Start     Dose/Rate Route Frequency Ordered Stop   05/10/22 2200  cefTRIAXone (ROCEPHIN) 2 g in sodium chloride 0.9 % 100 mL IVPB  Status:  Discontinued        2 g 200 mL/hr over 30 Minutes Intravenous Every 24 hours 05/10/22 0712 05/11/22 0758   05/10/22 1000  cefTRIAXone (ROCEPHIN) 1 g in sodium chloride 0.9 % 100 mL IVPB  Status:  Discontinued        1 g 200 mL/hr over 30 Minutes Intravenous Every 24 hours 05/10/22 0000 05/10/22 0340   05/10/22 1000  metroNIDAZOLE (FLAGYL) IVPB 500 mg  Status:  Discontinued        500 mg 100 mL/hr over 60 Minutes Intravenous 2 times daily 05/10/22 0700 05/11/22 0758   05/10/22 0700  cefTRIAXone (ROCEPHIN) 1 g in sodium chloride 0.9 % 100 mL IVPB  Status:  Discontinued        1 g 200 mL/hr over 30 Minutes Intravenous  Once 05/10/22 0700 05/10/22 0754   05/10/22 0430  piperacillin-tazobactam (ZOSYN) IVPB 3.375  g  Status:  Discontinued        3.375 g 12.5 mL/hr over 240 Minutes Intravenous Every 8 hours 05/10/22 0429 05/10/22 0604   05/09/22 1945  cefTRIAXone (ROCEPHIN) 1 g in sodium chloride 0.9 % 100 mL IVPB        1 g 200 mL/hr over 30 Minutes Intravenous  Once 05/09/22 1935 05/09/22 2145        MEDICATIONS: Scheduled Meds:  acidophilus  1 capsule Oral TID WC   Chlorhexidine Gluconate Cloth  6 each Topical Daily   Gerhardt's butt cream  1 Application Topical BID   heparin  5,000 Units Subcutaneous BID   hydrocortisone sod succinate (SOLU-CORTEF) inj  100 mg Intravenous Q8H   lipase/protease/amylase  48,000 Units Oral TID AC   memantine  5 mg Oral Daily   mesalamine  1,000 mg Rectal QHS   QUEtiapine  12.5 mg Oral QHS   saccharomyces boulardii  250 mg Oral BID   sodium chloride flush  10-40 mL Intracatheter Q12H   Continuous Infusions:  sodium chloride 125 mL/hr at 05/17/22 0600   sodium chloride Stopped (05/16/22 1558)   PRN Meds:.acetaminophen **OR** acetaminophen, loperamide HCl, Opium, mouth rinse, sodium chloride flush  I have personally reviewed following labs and imaging studies  LABORATORY DATA: CBC: Recent Labs  Lab 05/11/22 0332 05/13/22 0256 05/14/22 0028 05/15/22 0114 05/17/22 0459  WBC 7.4 6.8 7.2 10.1 4.4  HGB 10.2* 10.0* 11.2* 11.4* 8.8*  HCT 30.6* 30.4* 35.1* 35.2* 28.1*  MCV 80.5 80.4 80.9 82.1 82.2  PLT 277 241 222 212 170    Basic Metabolic Panel: Recent Labs  Lab 05/11/22 0332 05/13/22 0256 05/13/22 1129 05/14/22 0028 05/15/22 0114 05/17/22 0459  NA 137 136  --  137 131* 135  K 4.0 2.8*  --  4.6 4.5 3.4*  CL 107 103  --  105 100 103  CO2 25 28  --  27 21* 24  GLUCOSE 196* 169*  --  101* 122* 218*  BUN 9 8  --  5* 6* 7*  CREATININE 0.60 0.61  --  0.53 0.50 0.54  CALCIUM 9.7 9.7  --  9.6 9.6 9.9  MG 2.3  --  1.5* 2.1  --   --   PHOS 2.3*  --   --   --   --   --     GFR: Estimated Creatinine Clearance: 47.9 mL/min (by C-G formula  based on SCr of 0.54 mg/dL).  Liver Function Tests: Recent Labs  Lab 05/11/22 0332 05/13/22 0256 05/14/22 0028 05/15/22 0114 05/17/22 0459  AST 12* 14* 23 24 13*  ALT 8 9 16 21 13   ALKPHOS 52 61 61 67 45  BILITOT 0.5 0.3 0.4 0.4 0.3  PROT 5.0* 4.7* 5.5* 5.4* 4.9*  ALBUMIN 2.0* 1.9* 2.2* 2.0* 2.4*   No results for input(s): "LIPASE", "AMYLASE" in the last 168 hours. No results for input(s): "AMMONIA" in the last 168 hours.  Coagulation Profile: No results for input(s): "INR", "PROTIME" in the last 168 hours.  Cardiac Enzymes: No results for input(s): "CKTOTAL", "CKMB", "CKMBINDEX", "TROPONINI" in the last 168 hours.  BNP (last 3 results) No results for input(s): "PROBNP" in the last 8760 hours.  Lipid Profile: No results for input(s): "CHOL", "HDL", "LDLCALC", "TRIG", "CHOLHDL", "LDLDIRECT" in the last 72 hours.  Thyroid Function Tests: No results for input(s): "TSH", "T4TOTAL", "FREET4", "T3FREE", "THYROIDAB" in the last 72 hours.  Anemia Panel: No results for input(s): "VITAMINB12", "FOLATE", "FERRITIN", "TIBC", "IRON", "RETICCTPCT" in the last 72 hours.  Urine analysis:    Component Value Date/Time   COLORURINE YELLOW 05/09/2022 1845   APPEARANCEUR HAZY (A) 05/09/2022 1845   LABSPEC 1.012 05/09/2022 1845   PHURINE 6.0 05/09/2022 1845   GLUCOSEU NEGATIVE 05/09/2022 1845   GLUCOSEU NEGATIVE 07/23/2020 1006   HGBUR NEGATIVE 05/09/2022 1845   BILIRUBINUR NEGATIVE 05/09/2022 1845   BILIRUBINUR neg 03/28/2022 1405   KETONESUR NEGATIVE 05/09/2022 1845   PROTEINUR NEGATIVE 05/09/2022 1845   UROBILINOGEN 0.2 03/28/2022 1405   UROBILINOGEN 0.2 07/23/2020 1006   NITRITE POSITIVE (A) 05/09/2022 1845   LEUKOCYTESUR TRACE (A) 05/09/2022 1845    Sepsis Labs: Lactic Acid, Venous    Component Value Date/Time   LATICACIDVEN 2.4 (HH) 05/16/2022 1401    MICROBIOLOGY: Recent Results (from the past 240 hour(s))  Gastrointestinal Panel by PCR , Stool     Status: None    Collection Time: 05/09/22  6:36 PM   Specimen: Stool  Result Value Ref Range Status   Campylobacter species NOT DETECTED NOT DETECTED Final   Plesimonas shigelloides NOT DETECTED NOT DETECTED Final   Salmonella species NOT DETECTED NOT DETECTED Final   Yersinia enterocolitica NOT DETECTED NOT DETECTED Final  Vibrio species NOT DETECTED NOT DETECTED Final   Vibrio cholerae NOT DETECTED NOT DETECTED Final   Enteroaggregative E coli (EAEC) NOT DETECTED NOT DETECTED Final   Enteropathogenic E coli (EPEC) NOT DETECTED NOT DETECTED Final   Enterotoxigenic E coli (ETEC) NOT DETECTED NOT DETECTED Final   Shiga like toxin producing E coli (STEC) NOT DETECTED NOT DETECTED Final   Shigella/Enteroinvasive E coli (EIEC) NOT DETECTED NOT DETECTED Final   Cryptosporidium NOT DETECTED NOT DETECTED Final   Cyclospora cayetanensis NOT DETECTED NOT DETECTED Final   Entamoeba histolytica NOT DETECTED NOT DETECTED Final   Giardia lamblia NOT DETECTED NOT DETECTED Final   Adenovirus F40/41 NOT DETECTED NOT DETECTED Final   Astrovirus NOT DETECTED NOT DETECTED Final   Norovirus GI/GII NOT DETECTED NOT DETECTED Final   Rotavirus A NOT DETECTED NOT DETECTED Final   Sapovirus (I, II, IV, and V) NOT DETECTED NOT DETECTED Final    Comment: Performed at [facility], [address] [place], [place] [address]  C Difficile Quick Screen w PCR reflex     Status: None   Collection Time: 05/09/22  6:36 PM   Specimen: STOOL  Result Value Ref Range Status   C Diff antigen NEGATIVE NEGATIVE Final   C Diff toxin NEGATIVE NEGATIVE Final   C Diff interpretation No C. difficile detected.  Final    Comment: Performed at [facility], [address], [place], [place] [address]  Blood culture (routine x 2)     Status: None   Collection Time: 05/09/22  6:39 PM   Specimen: BLOOD  Result Value Ref Range Status   Specimen Description BLOOD BLOOD LEFT FOREARM  Final   Special Requests   Final    BOTTLES DRAWN  AEROBIC AND ANAEROBIC Blood Culture adequate volume   Culture   Final    NO GROWTH 5 DAYS Performed at [facility], [address] [place], [place] [address]    Report Status 05/14/2022 FINAL  Final  Blood culture (routine x 2)     Status: None   Collection Time: 05/09/22  6:45 PM   Specimen: BLOOD  Result Value Ref Range Status   Specimen Description BLOOD BLOOD RIGHT FOREARM  Final   Special Requests   Final    BOTTLES DRAWN AEROBIC AND ANAEROBIC Blood Culture adequate volume   Culture   Final    NO GROWTH 5 DAYS Performed at [facility], [address] [place], [place] [address]    Report Status 05/14/2022 FINAL  Final  Urine Culture     Status: Abnormal   Collection Time: 05/09/22  7:36 PM   Specimen: Urine, Catheterized  Result Value Ref Range Status   Specimen Description URINE, CATHETERIZED  Final   Special Requests   Final    NONE Performed at [facility], [address] [place], [place] [address]    Culture >=100,000 COLONIES/mL ESCHERICHIA COLI (A)  Final   Report Status 05/12/2022 FINAL  Final   Organism ID, Bacteria ESCHERICHIA COLI (A)  Final      Susceptibility   Escherichia coli - MIC*    AMPICILLIN 4 SENSITIVE Sensitive     CEFAZOLIN <=4 SENSITIVE Sensitive     CEFEPIME <=0.12 SENSITIVE Sensitive     CEFTRIAXONE <=0.25 SENSITIVE Sensitive     CIPROFLOXACIN <=0.25 SENSITIVE Sensitive     GENTAMICIN <=1 SENSITIVE Sensitive     IMIPENEM <=0.25 SENSITIVE Sensitive     NITROFURANTOIN <=16  SENSITIVE Sensitive     TRIMETH/SULFA <=20 SENSITIVE Sensitive     AMPICILLIN/SULBACTAM <=2 SENSITIVE Sensitive     PIP/TAZO <=4 SENSITIVE Sensitive     * >=100,000 COLONIES/mL ESCHERICHIA COLI  MRSA Next Gen by PCR, Nasal     Status: None   Collection Time: 05/10/22  8:54 AM   Specimen: Nasal Mucosa; Nasal Swab  Result Value Ref Range Status   MRSA by PCR Next Gen NOT DETECTED NOT DETECTED Final    Comment: (NOTE) The GeneXpert MRSA Assay (FDA  approved for NASAL specimens only), is one component of a comprehensive MRSA colonization surveillance program. It is not intended to diagnose MRSA infection nor to guide or monitor treatment for MRSA infections. Test performance is not FDA approved in patients less than 35 years old. Performed at [facility], [address] [place], [place] [address]     RADIOLOGY STUDIES/RESULTS: No results found.   LOS: 8 days   Jeoffrey Massed, MD  [facility]    To contact the attending provider between 7A-7P or the covering provider during after hours 7P-7A, please log into the web site [email or web address] and access using universal [place] password for that web site. If you do not have the password, please call the hospital operator.  05/17/2022, 9:05 AM

## 2022-05-17 NOTE — TOC Initial Note (Signed)
Transition of Care [facility]) - Initial/Assessment Note    Patient Details  Name: Cynthia Perry MRN: [id number] Date of Birth: 1945-04-24  [facility] CM/SW Contact:    Lawerance Sabal, RN Phone Number: 05/17/2022, 2:54 PM  Clinical Narrative:                  Patient admitted from home, increased confusion, UC Flare, diarrhea w hypovolemic shock. Has grey/brown liquid stool per flexiseal without gross blood. Diarrhea refractory to multiple therapies.  Prognosis guarded. TOC continues  to follow daily in progression rounds for opportunity to assist in DC planning as treatment plan and DC date determine  Expected Discharge Plan:  (TBD) Barriers to Discharge: Continued Medical Work up   Patient Goals and CMS Choice            Expected Discharge Plan and Services                                              Prior Living Arrangements/Services                       Activities of Daily Living Home Assistive Devices/Equipment: Environmental consultant (specify type) ADL Screening (condition at time of admission) Patient's cognitive ability adequate to safely complete daily activities?: No Is the patient deaf or have difficulty hearing?: No Does the patient have difficulty seeing, even when wearing glasses/contacts?: No Does the patient have difficulty concentrating, remembering, or making decisions?: Yes Patient able to express need for assistance with ADLs?: Yes Does the patient have difficulty dressing or bathing?: Yes Independently performs ADLs?: No Communication: Independent Dressing (OT): Dependent Is this a change from baseline?: Pre-admission baseline Grooming: Dependent Is this a change from baseline?: Pre-admission baseline Feeding: Dependent Is this a change from baseline?: Pre-admission baseline Bathing: Dependent Is this a change from baseline?: Pre-admission baseline Toileting: Dependent Is this a change from baseline?: Pre-admission  baseline In/Out Bed: Dependent Is this a change from baseline?: Pre-admission baseline Walks in Home: Dependent Is this a change from baseline?: Pre-admission baseline Does the patient have difficulty walking or climbing stairs?: Yes Weakness of Legs: Both Weakness of Arms/Hands: Both  Permission Sought/Granted                  Emotional Assessment              Admission diagnosis:  Colitis [K52.9] Shock (HCC) [R57.9] Encephalopathy [G93.40] Failure to thrive in adult [R62.7] Acute metabolic encephalopathy [G93.41] Patient Active Problem List   Diagnosis Date Noted   Shock (HCC) 05/10/2022   Acute metabolic encephalopathy 05/09/2022   Acute cystitis 05/09/2022   Diarrhea 05/09/2022   Moderate dementia (HCC) 09/08/2021   UTI symptoms 09/08/2021   Hypoalbuminemia 06/02/2021   Hyperthyroidism 05/31/2021   Hypokalemia, hypomagnesemia, hypophosphatemia, hyponatremia 05/30/2021   Hypophosphatemia 05/30/2021   Ulcerative colitis without complications (HCC) 05/30/2021   Bandemia 05/30/2021   Delirium 05/29/2021   Hypokalemia 05/29/2021   Hypomagnesemia 05/29/2021   Dehydration 05/29/2021   Leukocytosis 05/29/2021   Protein-calorie malnutrition, severe (HCC) 05/29/2021   Weight loss 05/10/2021   Xerostomia 05/10/2021   Major neurocognitive disorder due to Alzheimer's disease 12/30/2020   Left upper quadrant abdominal mass    Generalized abdominal pain    Hypercalcemia    Left sided ulcerative colitis    Tachycardia    Hypotension due  to drugs    Generalized anxiety disorder    History of ASCVD    Vitamin B12 deficiency    Debilitated    PCP:  Mliss Sax, MD Pharmacy:   [facility] [phone] - [place], [place] - [address] AT [address] [place] [address] Phone: [phone] Fax: [phone]     Social Determinants of Health (SDOH) Social History: SDOH Screenings   Food Insecurity:  Patient Unable To Answer (05/10/2022)  Transportation Needs: Patient Unable To Answer (05/10/2022)  Utilities: Patient Unable To Answer (05/10/2022)  Depression (PHQ2-9): Low Risk  (03/28/2022)  Tobacco Use: Low Risk  (05/09/2022)   SDOH Interventions: Food Insecurity Interventions: Intervention Not Indicated   Readmission Risk Interventions     No data to display

## 2022-05-17 NOTE — Consult Note (Signed)
[facility] Gastroenterology Consultation Note  Referring Provider: No ref. provider found Primary Care Physician:  Mliss Sax, MD Primary Gastroenterologist:  Dr. Charlott Rakes  Reason for Consultation:  diarrhea  HPI: Cynthia Perry is a 77 y.o. female history of left-sided ulcerative colitis, followed by Dr. Bosie Clos.  Admitted for diarrhea and shock.  Colitis on CT scan.  Similar admission May last year as well.  She is demented and can provide no history.  Has grey/brown liquid stool per flexiseal without gross blood.  Diarrhea refractory to multiple therapies.  Last colonoscopy 2022 showed left-sided ulcerative colitis (with biopsies).   Past Medical History:  Diagnosis Date   Abnormal urinalysis 07/23/2020   Debilitated    Elevated cholesterol    Generalized abdominal pain    Generalized anxiety disorder    History of ASCVD    Hypercalcemia    Hypercholesteremia    Hypotension due to drugs    Kidney stones    Left sided ulcerative colitis    Left upper quadrant abdominal mass    Major neurocognitive disorder due to Alzheimer's disease 12/30/2020   Tachycardia    Vitamin B12 deficiency     Past Surgical History:  Procedure Laterality Date   ABDOMINAL HYSTERECTOMY      Prior to Admission medications   Medication Sig Start Date End Date Taking? Authorizing Provider  acetaminophen (TYLENOL) 325 MG tablet Take 650 mg by mouth every 6 (six) hours as needed for moderate pain.   Yes [provider]  memantine (NAMENDA) 5 MG tablet Take 1 tablet (5 mg total) by mouth at bedtime. 01/05/22  Yes Marcos Eke, PA-C  mesalamine (APRISO) 0.375 g 24 hr capsule Take 1,500 mg by mouth daily.   Yes [provider]  feeding supplement (ENSURE ENLIVE / ENSURE PLUS) LIQD Take 237 mLs by mouth 2 (two) times daily between meals. Patient not taking: Reported on 03/28/2022 06/02/21   Marguerita Merles Latif, DO  HYDROcodone-acetaminophen (NORCO/VICODIN) 5-325 MG tablet  Take 1 tablet by mouth every 6 (six) hours as needed for severe pain. Patient not taking: Reported on 03/28/2022 06/02/21   Marguerita Merles Latif, DO  Multiple Vitamin (MULTIVITAMIN WITH MINERALS) TABS tablet Take 1 tablet by mouth daily. Patient not taking: Reported on 03/28/2022 06/03/21   Marguerita Merles Latif, DO  Nystatin (GERHARDT'S BUTT CREAM) CREA Apply 1 Application topically 2 (two) times daily. Patient not taking: Reported on 05/09/2022 09/08/21   Mliss Sax, MD  QUEtiapine (SEROQUEL) 25 MG tablet Take 0.5 tablets (12.5 mg total) by mouth daily. 06/03/21 09/08/21  Marguerita Merles Latif, DO  QUEtiapine (SEROQUEL) 25 MG tablet Take 1 tablet (25 mg total) by mouth at bedtime. Patient not taking: Reported on 03/28/2022 06/02/21   Merlene Laughter, DO    Current Facility-Administered Medications  Medication Dose Route Frequency Provider Last Rate Last Admin   0.9 %  sodium chloride infusion   Intravenous Continuous Maretta Bees, MD 125 mL/hr at 05/17/22 0600 Infusion Verify at 05/17/22 0600   acetaminophen (TYLENOL) tablet 650 mg  650 mg Oral Q6H PRN Rhetta Mura, MD   650 mg at 05/10/22 0981   Or   acetaminophen (TYLENOL) suppository 650 mg  650 mg Rectal Q6H PRN Rhetta Mura, MD       acidophilus (RISAQUAD) capsule 1 capsule  1 capsule Oral TID WC Rhetta Mura, MD   1 capsule at 05/17/22 0844   Chlorhexidine Gluconate Cloth 2 % PADS 6 each  6 each Topical Daily  Rhetta Mura, MD   6 each at 05/17/22 0144   fiber supplement (BANATROL TF) liquid 60 mL  60 mL Oral BID Maretta Bees, MD       Gerhardt's butt cream 1 Application  1 Application Topical BID Rhetta Mura, MD   1 Application at 05/17/22 0844   heparin injection 5,000 Units  5,000 Units Subcutaneous BID Rhetta Mura, MD   5,000 Units at 05/17/22 0844   hydrocortisone sodium succinate (SOLU-CORTEF) 100 MG injection 100 mg  100 mg Intravenous Q8H Rhetta Mura, MD   100 mg  at 05/17/22 0402   lipase/protease/amylase (CREON) capsule 48,000 Units  48,000 Units Oral TID Molli Barrows, MD   48,000 Units at 05/17/22 0843   loperamide HCl (IMODIUM) 1 MG/7.5ML suspension 4 mg  4 mg Oral PRN Rhetta Mura, MD       memantine (NAMENDA) tablet 5 mg  5 mg Oral Daily Rhetta Mura, MD   5 mg at 05/17/22 0844   mesalamine (CANASA) suppository 1,000 mg  1,000 mg Rectal QHS Rhetta Mura, MD       Opium 10 MG/ML (1%) tincture 20 mg  2 mL Oral Q6H PRN Rhetta Mura, MD   20 mg at 05/16/22 2250   Oral care mouth rinse  15 mL Mouth Rinse PRN Rhetta Mura, MD       potassium chloride (KLOR-CON) packet 40 mEq  40 mEq Oral Q4H Ghimire, Shanker M, MD       QUEtiapine (SEROQUEL) tablet 12.5 mg  12.5 mg Oral QHS Rhetta Mura, MD   12.5 mg at 05/16/22 2127   saccharomyces boulardii (FLORASTOR) capsule 250 mg  250 mg Oral BID Rhetta Mura, MD   250 mg at 05/17/22 0844   sodium chloride 0.9 % bolus 500 mL  500 mL Intravenous Once Rhetta Mura, MD   Stopped at 05/16/22 1558   sodium chloride flush (NS) 0.9 % injection 10-40 mL  10-40 mL Intracatheter Q12H Rhetta Mura, MD   10 mL at 05/17/22 0844   sodium chloride flush (NS) 0.9 % injection 10-40 mL  10-40 mL Intracatheter PRN Rhetta Mura, MD   10 mL at 05/10/22 1328    Allergies as of 05/09/2022   (No Known Allergies)    Family History  Problem Relation Age of Onset   Kidney disease Mother     Social History   Socioeconomic History   Marital status: Married    Spouse name: Not on file   Number of children: Not on file   Years of education: 12   Highest education level: High school graduate  Occupational History   Occupation: Retired    Comment: typist  Tobacco Use   Smoking status: Never   Smokeless tobacco: Never  Vaping Use   Vaping Use: Never used  Substance and Sexual Activity   Alcohol use: No   Drug use: Never   Sexual activity:  Never  Other Topics Concern   Not on file  Social History Narrative   Left handed    Lives with family    Occasionally caffeine   retired   Chief Executive Officer Determinants of Health   Financial Resource Strain: Not on file  Food Insecurity: Patient Unable To Answer (05/10/2022)   Hunger Vital Sign    Worried About Running Out of Food in the Last Year: Patient unable to answer    Ran Out of Food in the Last Year: Patient unable to answer  Transportation Needs: Patient Unable To Answer (05/10/2022)  PRAPARE - Administrator, Civil Service (Medical): Patient unable to answer    Lack of Transportation (Non-Medical): Patient unable to answer  Physical Activity: Not on file  Stress: Not on file  Social Connections: Not on file  Intimate Partner Violence: Patient Unable To Answer (05/10/2022)   Humiliation, Afraid, Rape, and Kick questionnaire    Fear of Current or Ex-Partner: Patient unable to answer    Emotionally Abused: Patient unable to answer    Physically Abused: Patient unable to answer    Sexually Abused: Patient unable to answer    Review of Systems: Unable to obtain due to dementia  Physical Exam: Vital signs in last 24 hours: Temp:  [97.2 F (36.2 C)-98.2 F (36.8 C)] 98 F (36.7 C) (05/14 0824) Pulse Rate:  [68-100] 75 (05/14 0500) Resp:  [14-18] 14 (05/14 0500) BP: (83-104)/(52-67) 104/67 (05/14 0500) SpO2:  [98 %-100 %] 98 % (05/14 0824) Weight:  [55 kg] 55 kg (05/14 0500) Last BM Date : 05/16/22 General:   Intermittently awake, cachectic, minimal communication, dementia Head:  bitemporal wasting Eyes:  Sclera clear, no icterus.   Conjunctiva pale Ears:  Normal auditory acuity. Nose:  No deformity, discharge,  or lesions. Mouth:  No deformity or lesions.  Oropharynx pale and dry Neck:  Supple; no masses or thyromegaly. Lungs:  Clear throughout to auscultation.   No wheezes, crackles, or rhonchi. No acute distress. Heart:  Regular rate and rhythm; no murmurs,  clicks, rubs,  or gallops. Abdomen:  Soft, nontender and nondistended. No masses, hepatosplenomegaly or hernias noted. Normal bowel sounds, without guarding, and without rebound.     Msk:  Thin, frail, but Symmetrical without gross deformities. Pulses:  Normal pulses noted. Extremities:  Without clubbing or edema. Neurologic:  Demented Skin:  Pale, Intact without significant lesions or rashes. Psych:  Demented   Lab Results: Recent Labs    05/15/22 0114 05/17/22 0459  WBC 10.1 4.4  HGB 11.4* 8.8*  HCT 35.2* 28.1*  PLT 212 170   BMET Recent Labs    05/15/22 0114 05/17/22 0459  NA 131* 135  K 4.5 3.4*  CL 100 103  CO2 21* 24  GLUCOSE 122* 218*  BUN 6* 7*  CREATININE 0.50 0.54  CALCIUM 9.6 9.9   LFT Recent Labs    05/17/22 0459  PROT 4.9*  ALBUMIN 2.4*  AST 13*  ALT 13  ALKPHOS 45  BILITOT 0.3   PT/INR No results for input(s): "LABPROT", "INR" in the last 72 hours.  Studies/Results: No results found.  Impression:   Diarrhea complicated by hypovolemic shock.  Colitis on CT scan.  Prior history ulcerative colitis on colonoscopy most recently 2022 by Dr. Bosie Clos.  Suspect ulcerative colitis flare, could have some compounded ischemic colitis.  C. Diff negative, repeat GI pathogen panel negative. Dementia.  Plan:   Patient in no condition for sigmoidoscopy, colonoscopy or other invasive work-up. We just started florastor and pancreatic enzymes yesterday; would see how this does.  Ok to add loperamide prn or scheduled as needed. Volume repletion, IVF as needed. Diet as tolerated. Difficult situation and overall prognosis guarded. [facility] will follow.   LOS: 8 days   Cortney Beissel M  05/17/2022, 10:57 AM  Cell [phone] If no answer or after 5 PM call [phone]

## 2022-05-18 DIAGNOSIS — R197 Diarrhea, unspecified: Secondary | ICD-10-CM | POA: Diagnosis not present

## 2022-05-18 DIAGNOSIS — G9341 Metabolic encephalopathy: Secondary | ICD-10-CM | POA: Diagnosis not present

## 2022-05-18 DIAGNOSIS — E43 Unspecified severe protein-calorie malnutrition: Secondary | ICD-10-CM | POA: Diagnosis not present

## 2022-05-18 DIAGNOSIS — K5130 Ulcerative (chronic) rectosigmoiditis without complications: Secondary | ICD-10-CM | POA: Diagnosis not present

## 2022-05-18 LAB — COMPREHENSIVE METABOLIC PANEL
ALT: 14 U/L (ref 0–44)
AST: 11 U/L — ABNORMAL LOW (ref 15–41)
Albumin: 2.7 g/dL — ABNORMAL LOW (ref 3.5–5.0)
Alkaline Phosphatase: 60 U/L (ref 38–126)
Anion gap: 7 (ref 5–15)
BUN: 7 mg/dL — ABNORMAL LOW (ref 8–23)
CO2: 25 mmol/L (ref 22–32)
Calcium: 11.1 mg/dL — ABNORMAL HIGH (ref 8.9–10.3)
Chloride: 104 mmol/L (ref 98–111)
Creatinine, Ser: 0.55 mg/dL (ref 0.44–1.00)
GFR, Estimated: >60 mL/min (ref >60)
Glucose, Bld: 186 mg/dL — ABNORMAL HIGH (ref 70–99)
Potassium: 3.7 mmol/L (ref 3.5–5.1)
Sodium: 136 mmol/L (ref 135–145)
Total Bilirubin: 0.3 mg/dL (ref 0.3–1.2)
Total Protein: 5.7 g/dL — ABNORMAL LOW (ref 6.5–8.1)

## 2022-05-18 LAB — CBC
HCT: 34 % — ABNORMAL LOW (ref 36.0–46.0)
Hemoglobin: 10.8 g/dL — ABNORMAL LOW (ref 12.0–15.0)
MCH: 25.8 pg — ABNORMAL LOW (ref 26.0–34.0)
MCHC: 31.8 g/dL (ref 30.0–36.0)
MCV: 81.3 fL (ref 80.0–100.0)
Platelets: 285 10*3/uL (ref 150–400)
RBC: 4.18 MIL/uL (ref 3.87–5.11)
RDW: 16.9 % — ABNORMAL HIGH (ref 11.5–15.5)
WBC: 6.3 10*3/uL (ref 4.0–10.5)
nRBC: 0 % (ref 0.0–0.2)

## 2022-05-18 LAB — MAGNESIUM: Magnesium: 2.1 mg/dL (ref 1.7–2.4)

## 2022-05-18 MED ORDER — LOPERAMIDE HCL 2 MG PO CAPS
4.0000 mg | ORAL_CAPSULE | Freq: Three times a day (TID) | ORAL | Status: DC
  Administered 2022-05-18 – 2022-05-21 (×11): 4 mg via ORAL
  Filled 2022-05-18 (×11): qty 2

## 2022-05-18 MED ORDER — PREDNISONE 20 MG PO TABS
40.0000 mg | ORAL_TABLET | Freq: Every day | ORAL | Status: DC
  Administered 2022-05-18 – 2022-05-23 (×6): 40 mg via ORAL
  Filled 2022-05-18 (×6): qty 2

## 2022-05-18 NOTE — Progress Notes (Signed)
Physical Therapy Treatment Patient Details Name: Cynthia Perry MRN: [id number] DOB: Sep 18, 1945 Today's Date: 05/18/2022   History of Present Illness 77 y.o. female presents to [facility] hospital on 05/09/2022 with loose watery stools, AMS, hypotension. Pt with recent UTI on bactrim. PMH includes ulcerative colitis, anxiety, depression, alzheimer's disease, HTN, GAD.       PT Comments    Pt tolerated today's session well, but remains limited by cognition. Pt able to minimally participate and intermittently following commands. With initial sit at EOB pt with increased trunk flexion, improving posture and balance with pt's hands propped on back of straight legged chair. Attempted to get pt to reach outside of BOS but pt difficulty following commands able to perform once but unable to consistently follow. Pt participated in AAROM shoulder flexion bilaterally to encourage upright posture and use of core for balance. Pt participating in 3 sit<>stand trials, initiating the last trial and accepting weight through BLE, but unable to achieve full upright posture. Pt required totalA to return to supine and for repositioning. Acute PT will continue to follow up with pt as appropriate to maintain mobility, discharge recommendations remain appropriate.     Recommendations for follow up therapy are one component of a multi-disciplinary discharge planning process, led by the attending physician.  Recommendations may be updated based on patient status, additional functional criteria and insurance authorization.  Follow Up Recommendations  Can patient physically be transported by private vehicle: No    Assistance Recommended at Discharge Frequent or constant Supervision/Assistance  Patient can return home with the following A lot of help with bathing/dressing/bathroom;Assistance with cooking/housework;Assistance with feeding;Direct supervision/assist for medications management;Direct supervision/assist for financial  management;Assist for transportation;Help with stairs or ramp for entrance;Two people to help with walking and/or transfers   Equipment Recommendations  None recommended by PT    Recommendations for Other Services Other (comment) (palliative care consult)     Precautions / Restrictions Precautions Precautions: Fall Precaution Comments: rectal tube Restrictions Weight Bearing Restrictions: No     Mobility  Bed Mobility Overal bed mobility: Needs Assistance Bed Mobility: Supine to Sit, Sit to Supine     Supine to sit: Max assist, HOB elevated Sit to supine: HOB elevated, Total assist   General bed mobility comments: pt long sitting in bed, pivoting to EOB with maxA for BLE management and pt assist minimally with UE but requiring trunk support. TotalA required to return to supine    Transfers Overall transfer level: Needs assistance Equipment used: 1 person hand held assist Transfers: Sit to/from Stand Sit to Stand: Max assist           General transfer comment: HHA to stand x3 trials with maxA, pt mildly initiating and able to accept weight through BLE but stands with hip and trunk flexion, not fully obtaining upright posture and unable to step in place at this time    Ambulation/Gait               General Gait Details: unable today   Stairs             Wheelchair Mobility    Modified Rankin (Stroke Patients Only)       Balance Overall balance assessment: Needs assistance Sitting-balance support: Bilateral upper extremity supported, Feet supported Sitting balance-Leahy Scale: Fair Sitting balance - Comments: sitting EOB with increased trunk flexion initially, provided straight leg chair in front of pt and able to sit more upright with hands propped on the back of the chair for support  Standing balance support: Bilateral upper extremity supported Standing balance-Leahy Scale: Zero Standing balance comment: maxA to maintain static standing  balance with BUE support                            Cognition Arousal/Alertness: Awake/alert Behavior During Therapy: Restless Overall Cognitive Status: History of cognitive impairments - at baseline                                 General Comments: advanced dementia, unable to answer any questions appropriately, repeating "Please don't say any funny jokes" throughout session and even before session started as pt could be heard from the hallway. Pt was able to read badge, intermittently stating "physical therapy".        Exercises General Exercises - Upper Extremity Shoulder Flexion: AAROM, Both, 10 reps, Seated    General Comments General comments (skin integrity, edema, etc.): VSS on room air      Pertinent Vitals/Pain Pain Assessment Pain Assessment: Faces Faces Pain Scale: No hurt    Home Living                          Prior Function            PT Goals (current goals can now be found in the care plan section) Acute Rehab PT Goals Patient Stated Goal: to resolve colitis and return home PT Goal Formulation: With family Time For Goal Achievement: 05/27/22 Potential to Achieve Goals: Fair Progress towards PT goals: Not progressing toward goals - comment (pt with improved participation but remains limited by cognition)    Frequency    Min 2X/week      PT Plan Frequency needs to be updated    Co-evaluation              AM-PAC PT "6 Clicks" Mobility   Outcome Measure  Help needed turning from your back to your side while in a flat bed without using bedrails?: A Lot Help needed moving from lying on your back to sitting on the side of a flat bed without using bedrails?: A Lot Help needed moving to and from a bed to a chair (including a wheelchair)?: Total Help needed standing up from a chair using your arms (e.g., wheelchair or bedside chair)?: A Lot Help needed to walk in hospital room?: Total Help needed climbing 3-5  steps with a railing? : Total 6 Click Score: 9    End of Session Equipment Utilized During Treatment: Gait belt Activity Tolerance: Patient tolerated treatment well Patient left: in bed;with call bell/phone within reach;with bed alarm set Nurse Communication: Mobility status PT Visit Diagnosis: Other abnormalities of gait and mobility (R26.89);Muscle weakness (generalized) (M62.81)     Time: 1610-9604 PT Time Calculation (min) (ACUTE ONLY): 15 min  Charges:  $Therapeutic Activity: 8-22 mins                     Lindalou Hose, PT DPT Acute Rehabilitation Services Office [phone]    Leonie Man 05/18/2022, 3:29 PM

## 2022-05-18 NOTE — Progress Notes (Signed)
PROGRESS NOTE        PATIENT DETAILS Name: Cynthia Perry Age: 77 y.o. Sex: female Date of Birth: August 02, 1945 Admit Date: 05/09/2022 Admitting Physician Steffanie Dunn, DO ZOX:[id number], Talmadge Coventry, MD  Brief Summary: Patient is a 77 y.o.  female with history of ulcerative colitis-on mesalamine, advanced dementia-history of recent UTI treated with Bactrim-presented to the hospital on 5/6 with diarrhea/weakness/aggressive behavior-she was subsequently admitted to the hospitalist service.  Hospital course complicated by hypotension requiring pressors and transferred to the ICU.  See below for further details.  Significant events: 5/06>> admit to [facility] 5/07>> transferred to ICU for hypotension-low-dose pressors/stress dose steroids 5/09>> transfer to [facility] 5/13>> hypotensive-responded to IV fluid bolus-transfer to progressive care.  Significant studies: 5/06>> CXR: No active disease 5/06>> CT head: No acute intracranial abnormality.  3.8 soft tissue lesion-left pharyngeal space. 5/06>> CT abdomen/pelvis: Left hemicolon colitis  Significant microbiology data: 5/06>> stool C. difficile: Negative 5/06>> GI pathogen panel: Negative 5/06>> urine culture: E. coli 5/06>> blood culture: Negative 5/13>> GI pathogen panel: Negative  Procedures: None  Consults: PCCM GI  Subjective: No major issues overnight-remains very confused.  Pulling out her telemetry leads.  Objective: Vitals: Blood pressure (!) 153/91, pulse (!) 109, temperature 98.3 F (36.8 C), temperature source Axillary, resp. rate 20, height 5\' 3"  (1.6 m), weight 57.4 kg, SpO2 100 %.   Exam: Gen Exam: Confused but-not in any distress HEENT:atraumatic, normocephalic Chest: B/L clear to auscultation anteriorly CVS:S1S2 regular Abdomen:soft non tender, non distended Extremities:no edema Neurology: Non focal Skin: no rash  Pertinent Labs/Radiology:    Latest Ref Rng & Units 05/18/2022    7:34 AM  05/17/2022    4:59 AM 05/15/2022    1:14 AM  CBC  WBC 4.0 - 10.5 K/uL 6.3  4.4  10.1   Hemoglobin 12.0 - 15.0 g/dL 40.9  8.8  81.1   Hematocrit 36.0 - 46.0 % 34.0  28.1  35.2   Platelets 150 - 400 K/uL 285  170  212     Lab Results  Component Value Date   NA 136 05/18/2022   K 3.7 05/18/2022   CL 104 05/18/2022   CO2 25 05/18/2022     Assessment/Plan: Hypovolemic shock due to diarrhea Reoccurred on 5/13-responded to IVF BP stable overnight Continue to treat underlying UC flare Decrease IVF to 50 cc an hour Completed stress dose steroids  Diarrhea-likely UC flare Stool studies negative Mesalamine suppository No longer on IV steroids-stress dose-will discuss with GI to see if we can contemplate short course of oral prednisone Continue Creon Continue scheduled Imodium-changed to 3 times daily dosing Discussed with nursing staff-strict monitoring of stool output Given advanced dementia-limited options-will not tolerate any endoscopic evaluation.  Asymptomatic bacteriuria Urine culture positive for E. coli-UA done on same day-not suggestive of UTI.   Given dementia-history is limited but clinical features are not suggestive of UTI.  Acute metabolic encephalopathy superimposed on advanced dementia Worsening encephalopathy-in the setting of acute hospitalization/high-dose steroids/UC flare Treat underlying UC flare Continue Namenda-low-dose Seroquel Maintain delirium precautions  Hypokalemia GI loss Replete/recheck  Normocytic anemia Secondary to acute illness No evidence of GI bleed/blood loss Follow CBC  3.8 soft tissue lesion-left pharyngeal space Seen incidentally on CT head Discussed with daughter on 5/14-we have decided to not pursue any further workup-as she would not be a candidate for surgical excision/chemo/radiation  if this indeed turned out to be malignant.  Furthermore she is asymptomatic-and this is obviously a incidental finding.    Palliative  care DNR Goals of care are for gentle medical treatment-hopefully we can find a way to slow her diarrhea/control colitis-however family aware that if despite our best efforts-she continues to decline/-hypotension etc.-we may need to consider hospice care.   Nutrition Status: Nutrition Problem: Severe Malnutrition Etiology: social / environmental circumstances (inability to self-feed) Signs/Symptoms: severe fat depletion, severe muscle depletion Interventions: Refer to RD note for recommendations  BMI/ Estimated body mass index is 22.42 kg/m as calculated from the following:   Height as of this encounter: 5\' 3"  (1.6 m).   Weight as of this encounter: 57.4 kg.   Code status:   Code Status: DNR   DVT Prophylaxis: heparin injection 5,000 Units Start: 05/10/22 1045 SCDs Start: 05/10/22 1610   Family Communication: Daughter-Karen-[phone]-update over the phone 5/15.   Disposition Plan: Status is: Inpatient Remains inpatient appropriate because: Severity of illness   Planned Discharge Destination:Home health   Diet: Diet Order             Diet regular Room service appropriate? No; Fluid consistency: Thin  Diet effective now                     Antimicrobial agents: Anti-infectives (From admission, onward)    Start     Dose/Rate Route Frequency Ordered Stop   05/10/22 2200  cefTRIAXone (ROCEPHIN) 2 g in sodium chloride 0.9 % 100 mL IVPB  Status:  Discontinued        2 g 200 mL/hr over 30 Minutes Intravenous Every 24 hours 05/10/22 0712 05/11/22 0758   05/10/22 1000  cefTRIAXone (ROCEPHIN) 1 g in sodium chloride 0.9 % 100 mL IVPB  Status:  Discontinued        1 g 200 mL/hr over 30 Minutes Intravenous Every 24 hours 05/10/22 0000 05/10/22 0340   05/10/22 1000  metroNIDAZOLE (FLAGYL) IVPB 500 mg  Status:  Discontinued        500 mg 100 mL/hr over 60 Minutes Intravenous 2 times daily 05/10/22 0700 05/11/22 0758   05/10/22 0700  cefTRIAXone (ROCEPHIN) 1 g in sodium  chloride 0.9 % 100 mL IVPB  Status:  Discontinued        1 g 200 mL/hr over 30 Minutes Intravenous  Once 05/10/22 0700 05/10/22 0754   05/10/22 0430  piperacillin-tazobactam (ZOSYN) IVPB 3.375 g  Status:  Discontinued        3.375 g 12.5 mL/hr over 240 Minutes Intravenous Every 8 hours 05/10/22 0429 05/10/22 0604   05/09/22 1945  cefTRIAXone (ROCEPHIN) 1 g in sodium chloride 0.9 % 100 mL IVPB        1 g 200 mL/hr over 30 Minutes Intravenous  Once 05/09/22 1935 05/09/22 2145        MEDICATIONS: Scheduled Meds:  acidophilus  1 capsule Oral TID WC   Chlorhexidine Gluconate Cloth  6 each Topical Daily   fiber supplement (BANATROL TF)  60 mL Oral BID   Gerhardt's butt cream  1 Application Topical BID   heparin  5,000 Units Subcutaneous BID   lipase/protease/amylase  48,000 Units Oral TID AC   loperamide  4 mg Oral BID   memantine  5 mg Oral Daily   mesalamine  1,000 mg Rectal QHS   QUEtiapine  12.5 mg Oral QHS   saccharomyces boulardii  250 mg Oral BID   sodium chloride flush  10-40 mL Intracatheter Q12H   Continuous Infusions:  sodium chloride 75 mL/hr at 05/17/22 1230   sodium chloride Stopped (05/16/22 1558)   PRN Meds:.acetaminophen **OR** acetaminophen, loperamide HCl, Opium, mouth rinse, sodium chloride flush   I have personally reviewed following labs and imaging studies  LABORATORY DATA: CBC: Recent Labs  Lab 05/13/22 0256 05/14/22 0028 05/15/22 0114 05/17/22 0459 05/18/22 0734  WBC 6.8 7.2 10.1 4.4 6.3  HGB 10.0* 11.2* 11.4* 8.8* 10.8*  HCT 30.4* 35.1* 35.2* 28.1* 34.0*  MCV 80.4 80.9 82.1 82.2 81.3  PLT 241 222 212 170 285     Basic Metabolic Panel: Recent Labs  Lab 05/13/22 0256 05/13/22 1129 05/14/22 0028 05/15/22 0114 05/17/22 0459 05/18/22 0734  NA 136  --  137 131* 135 136  K 2.8*  --  4.6 4.5 3.4* 3.7  CL 103  --  105 100 103 104  CO2 28  --  27 21* 24 25  GLUCOSE 169*  --  101* 122* 218* 186*  BUN 8  --  5* 6* 7* 7*  CREATININE 0.61   --  0.53 0.50 0.54 0.55  CALCIUM 9.7  --  9.6 9.6 9.9 11.1*  MG  --  1.5* 2.1  --   --  2.1     GFR: Estimated Creatinine Clearance: 47.9 mL/min (by C-G formula based on SCr of 0.55 mg/dL).  Liver Function Tests: Recent Labs  Lab 05/13/22 0256 05/14/22 0028 05/15/22 0114 05/17/22 0459 05/18/22 0734  AST 14* 23 24 13* 11*  ALT 9 16 21 13 14   ALKPHOS 61 61 67 45 60  BILITOT 0.3 0.4 0.4 0.3 0.3  PROT 4.7* 5.5* 5.4* 4.9* 5.7*  ALBUMIN 1.9* 2.2* 2.0* 2.4* 2.7*    No results for input(s): "LIPASE", "AMYLASE" in the last 168 hours. No results for input(s): "AMMONIA" in the last 168 hours.  Coagulation Profile: No results for input(s): "INR", "PROTIME" in the last 168 hours.  Cardiac Enzymes: No results for input(s): "CKTOTAL", "CKMB", "CKMBINDEX", "TROPONINI" in the last 168 hours.  BNP (last 3 results) No results for input(s): "PROBNP" in the last 8760 hours.  Lipid Profile: No results for input(s): "CHOL", "HDL", "LDLCALC", "TRIG", "CHOLHDL", "LDLDIRECT" in the last 72 hours.  Thyroid Function Tests: No results for input(s): "TSH", "T4TOTAL", "FREET4", "T3FREE", "THYROIDAB" in the last 72 hours.  Anemia Panel: No results for input(s): "VITAMINB12", "FOLATE", "FERRITIN", "TIBC", "IRON", "RETICCTPCT" in the last 72 hours.  Urine analysis:    Component Value Date/Time   COLORURINE YELLOW 05/09/2022 1845   APPEARANCEUR HAZY (A) 05/09/2022 1845   LABSPEC 1.012 05/09/2022 1845   PHURINE 6.0 05/09/2022 1845   GLUCOSEU NEGATIVE 05/09/2022 1845   GLUCOSEU NEGATIVE 07/23/2020 1006   HGBUR NEGATIVE 05/09/2022 1845   BILIRUBINUR NEGATIVE 05/09/2022 1845   BILIRUBINUR neg 03/28/2022 1405   KETONESUR NEGATIVE 05/09/2022 1845   PROTEINUR NEGATIVE 05/09/2022 1845   UROBILINOGEN 0.2 03/28/2022 1405   UROBILINOGEN 0.2 07/23/2020 1006   NITRITE POSITIVE (A) 05/09/2022 1845   LEUKOCYTESUR TRACE (A) 05/09/2022 1845    Sepsis Labs: Lactic Acid, Venous    Component Value  Date/Time   LATICACIDVEN 2.4 (HH) 05/16/2022 1401    MICROBIOLOGY: Recent Results (from the past 240 hour(s))  Gastrointestinal Panel by PCR , Stool     Status: None   Collection Time: 05/09/22  6:36 PM   Specimen: Stool  Result Value Ref Range Status   Campylobacter species NOT DETECTED NOT DETECTED Final   Plesimonas shigelloides  NOT DETECTED NOT DETECTED Final   Salmonella species NOT DETECTED NOT DETECTED Final   Yersinia enterocolitica NOT DETECTED NOT DETECTED Final   Vibrio species NOT DETECTED NOT DETECTED Final   Vibrio cholerae NOT DETECTED NOT DETECTED Final   Enteroaggregative E coli (EAEC) NOT DETECTED NOT DETECTED Final   Enteropathogenic E coli (EPEC) NOT DETECTED NOT DETECTED Final   Enterotoxigenic E coli (ETEC) NOT DETECTED NOT DETECTED Final   Shiga like toxin producing E coli (STEC) NOT DETECTED NOT DETECTED Final   Shigella/Enteroinvasive E coli (EIEC) NOT DETECTED NOT DETECTED Final   Cryptosporidium NOT DETECTED NOT DETECTED Final   Cyclospora cayetanensis NOT DETECTED NOT DETECTED Final   Entamoeba histolytica NOT DETECTED NOT DETECTED Final   Giardia lamblia NOT DETECTED NOT DETECTED Final   Adenovirus F40/41 NOT DETECTED NOT DETECTED Final   Astrovirus NOT DETECTED NOT DETECTED Final   Norovirus GI/GII NOT DETECTED NOT DETECTED Final   Rotavirus A NOT DETECTED NOT DETECTED Final   Sapovirus (I, II, IV, and V) NOT DETECTED NOT DETECTED Final    Comment: Performed at [facility], [address] [place], [place] [address]  C Difficile Quick Screen w PCR reflex     Status: None   Collection Time: 05/09/22  6:36 PM   Specimen: STOOL  Result Value Ref Range Status   C Diff antigen NEGATIVE NEGATIVE Final   C Diff toxin NEGATIVE NEGATIVE Final   C Diff interpretation No C. difficile detected.  Final    Comment: Performed at [facility], [address] [place], [place] [address]  Blood culture (routine x 2)     Status: None   Collection  Time: 05/09/22  6:39 PM   Specimen: BLOOD  Result Value Ref Range Status   Specimen Description BLOOD BLOOD LEFT FOREARM  Final   Special Requests   Final    BOTTLES DRAWN AEROBIC AND ANAEROBIC Blood Culture adequate volume   Culture   Final    NO GROWTH 5 DAYS Performed at [facility], [address] [place], [place] [address]    Report Status 05/14/2022 FINAL  Final  Blood culture (routine x 2)     Status: None   Collection Time: 05/09/22  6:45 PM   Specimen: BLOOD  Result Value Ref Range Status   Specimen Description BLOOD BLOOD RIGHT FOREARM  Final   Special Requests   Final    BOTTLES DRAWN AEROBIC AND ANAEROBIC Blood Culture adequate volume   Culture   Final    NO GROWTH 5 DAYS Performed at [facility], [address], [place], [place] [address]    Report Status 05/14/2022 FINAL  Final  Urine Culture     Status: Abnormal   Collection Time: 05/09/22  7:36 PM   Specimen: Urine, Catheterized  Result Value Ref Range Status   Specimen Description URINE, CATHETERIZED  Final   Special Requests   Final    NONE Performed at [facility], [address], [place], [place] [address]    Culture >=100,000 COLONIES/mL ESCHERICHIA COLI (A)  Final   Report Status 05/12/2022 FINAL  Final   Organism ID, Bacteria ESCHERICHIA COLI (A)  Final      Susceptibility   Escherichia coli - MIC*    AMPICILLIN 4 SENSITIVE Sensitive     CEFAZOLIN <=4 SENSITIVE Sensitive     CEFEPIME <=0.12 SENSITIVE Sensitive     CEFTRIAXONE <=0.25 SENSITIVE Sensitive     CIPROFLOXACIN <=  0.25 SENSITIVE Sensitive     GENTAMICIN <=1 SENSITIVE Sensitive     IMIPENEM <=0.25 SENSITIVE Sensitive     NITROFURANTOIN <=16 SENSITIVE Sensitive     TRIMETH/SULFA <=20 SENSITIVE Sensitive     AMPICILLIN/SULBACTAM <=2 SENSITIVE Sensitive     PIP/TAZO <=4 SENSITIVE Sensitive     * >=100,000 COLONIES/mL ESCHERICHIA COLI  MRSA Next Gen by PCR, Nasal     Status: None   Collection Time: 05/10/22  8:54 AM    Specimen: Nasal Mucosa; Nasal Swab  Result Value Ref Range Status   MRSA by PCR Next Gen NOT DETECTED NOT DETECTED Final    Comment: (NOTE) The GeneXpert MRSA Assay (FDA approved for NASAL specimens only), is one component of a comprehensive MRSA colonization surveillance program. It is not intended to diagnose MRSA infection nor to guide or monitor treatment for MRSA infections. Test performance is not FDA approved in patients less than 39 years old. Performed at [facility], [address] [place], [place] [address]   Gastrointestinal Panel by PCR , Stool     Status: None   Collection Time: 05/16/22  4:23 PM   Specimen: Stool  Result Value Ref Range Status   Campylobacter species NOT DETECTED NOT DETECTED Final   Plesimonas shigelloides NOT DETECTED NOT DETECTED Final   Salmonella species NOT DETECTED NOT DETECTED Final   Yersinia enterocolitica NOT DETECTED NOT DETECTED Final   Vibrio species NOT DETECTED NOT DETECTED Final   Vibrio cholerae NOT DETECTED NOT DETECTED Final   Enteroaggregative E coli (EAEC) NOT DETECTED NOT DETECTED Final   Enteropathogenic E coli (EPEC) NOT DETECTED NOT DETECTED Final   Enterotoxigenic E coli (ETEC) NOT DETECTED NOT DETECTED Final   Shiga like toxin producing E coli (STEC) NOT DETECTED NOT DETECTED Final   Shigella/Enteroinvasive E coli (EIEC) NOT DETECTED NOT DETECTED Final   Cryptosporidium NOT DETECTED NOT DETECTED Final   Cyclospora cayetanensis NOT DETECTED NOT DETECTED Final   Entamoeba histolytica NOT DETECTED NOT DETECTED Final   Giardia lamblia NOT DETECTED NOT DETECTED Final   Adenovirus F40/41 NOT DETECTED NOT DETECTED Final   Astrovirus NOT DETECTED NOT DETECTED Final   Norovirus GI/GII NOT DETECTED NOT DETECTED Final   Rotavirus A NOT DETECTED NOT DETECTED Final   Sapovirus (I, II, IV, and V) NOT DETECTED NOT DETECTED Final    Comment: Performed at [facility], [address], [place], [place] [address]     RADIOLOGY STUDIES/RESULTS: No results found.   LOS: 9 days   Jeoffrey Massed, MD  [facility]    To contact the attending provider between 7A-7P or the covering provider during after hours 7P-7A, please log into the web site [email or web address] and access using universal [place] password for that web site. If you do not have the password, please call the hospital operator.  05/18/2022, 11:20 AM

## 2022-05-19 DIAGNOSIS — K5130 Ulcerative (chronic) rectosigmoiditis without complications: Secondary | ICD-10-CM | POA: Diagnosis not present

## 2022-05-19 DIAGNOSIS — E43 Unspecified severe protein-calorie malnutrition: Secondary | ICD-10-CM | POA: Diagnosis not present

## 2022-05-19 DIAGNOSIS — G9341 Metabolic encephalopathy: Secondary | ICD-10-CM | POA: Diagnosis not present

## 2022-05-19 DIAGNOSIS — R197 Diarrhea, unspecified: Secondary | ICD-10-CM | POA: Diagnosis not present

## 2022-05-19 LAB — COMPREHENSIVE METABOLIC PANEL
ALT: 18 U/L (ref 0–44)
AST: 18 U/L (ref 15–41)
Albumin: 2.6 g/dL — ABNORMAL LOW (ref 3.5–5.0)
Alkaline Phosphatase: 65 U/L (ref 38–126)
Anion gap: 9 (ref 5–15)
BUN: 8 mg/dL (ref 8–23)
CO2: 26 mmol/L (ref 22–32)
Calcium: 10.8 mg/dL — ABNORMAL HIGH (ref 8.9–10.3)
Chloride: 99 mmol/L (ref 98–111)
Creatinine, Ser: 0.6 mg/dL (ref 0.44–1.00)
GFR, Estimated: >60 mL/min (ref >60)
Glucose, Bld: 139 mg/dL — ABNORMAL HIGH (ref 70–99)
Potassium: 3.5 mmol/L (ref 3.5–5.1)
Sodium: 134 mmol/L — ABNORMAL LOW (ref 135–145)
Total Bilirubin: 0.3 mg/dL (ref 0.3–1.2)
Total Protein: 5.8 g/dL — ABNORMAL LOW (ref 6.5–8.1)

## 2022-05-19 LAB — CBC
HCT: 34.7 % — ABNORMAL LOW (ref 36.0–46.0)
Hemoglobin: 11.5 g/dL — ABNORMAL LOW (ref 12.0–15.0)
MCH: 26.1 pg (ref 26.0–34.0)
MCHC: 33.1 g/dL (ref 30.0–36.0)
MCV: 78.9 fL — ABNORMAL LOW (ref 80.0–100.0)
Platelets: 328 10*3/uL (ref 150–400)
RBC: 4.4 MIL/uL (ref 3.87–5.11)
RDW: 16.7 % — ABNORMAL HIGH (ref 11.5–15.5)
WBC: 10.6 10*3/uL — ABNORMAL HIGH (ref 4.0–10.5)
nRBC: 0 % (ref 0.0–0.2)

## 2022-05-19 MED ORDER — POTASSIUM CHLORIDE 20 MEQ PO PACK
40.0000 meq | PACK | Freq: Once | ORAL | Status: AC
  Administered 2022-05-19: 40 meq via ORAL
  Filled 2022-05-19: qty 2

## 2022-05-19 MED ORDER — ENSURE ENLIVE PO LIQD
237.0000 mL | Freq: Three times a day (TID) | ORAL | Status: DC
  Administered 2022-05-19 – 2022-05-25 (×17): 237 mL via ORAL

## 2022-05-19 NOTE — Progress Notes (Signed)
Subjective: No complaints  Objective: Vital signs in last 24 hours: Temp:  [97.7 F (36.5 C)-97.9 F (36.6 C)] 97.7 F (36.5 C) (05/16 0759) Pulse Rate:  [82-106] 82 (05/16 0759) Resp:  [16-20] 16 (05/16 0759) BP: (98-156)/(62-105) 98/62 (05/16 0759) SpO2:  [95 %-98 %] 95 % (05/16 0759) Weight:  [56.9 kg] 56.9 kg (05/16 0357) Weight change: -0.5 kg Last BM Date : 05/19/22  PE: GEN:  Elderly, frail-appearing, more alert and interactive but with dementia unable to carry on coherent conversation   Lab Results: CBC    Component Value Date/Time   WBC 10.6 (H) 05/19/2022 0405   RBC 4.40 05/19/2022 0405   HGB 11.5 (L) 05/19/2022 0405   HCT 34.7 (L) 05/19/2022 0405   PLT 328 05/19/2022 0405   MCV 78.9 (L) 05/19/2022 0405   MCH 26.1 05/19/2022 0405   MCHC 33.1 05/19/2022 0405   RDW 16.7 (H) 05/19/2022 0405   LYMPHSABS 0.4 (L) 05/10/2022 0244   MONOABS 0.7 05/10/2022 0244   EOSABS 0.0 05/10/2022 0244   BASOSABS 0.0 05/10/2022 0244  CMP     Component Value Date/Time   NA 134 (L) 05/19/2022 0405   K 3.5 05/19/2022 0405   CL 99 05/19/2022 0405   CO2 26 05/19/2022 0405   GLUCOSE 139 (H) 05/19/2022 0405   BUN 8 05/19/2022 0405   CREATININE 0.60 05/19/2022 0405   CALCIUM 10.8 (H) 05/19/2022 0405   PROT 5.8 (L) 05/19/2022 0405   ALBUMIN 2.6 (L) 05/19/2022 0405   AST 18 05/19/2022 0405   ALT 18 05/19/2022 0405   ALKPHOS 65 05/19/2022 0405   BILITOT 0.3 05/19/2022 0405   GFRNONAA >60 05/19/2022 0405   GFRAA >60 02/01/2017 2030   Assessment:   Diarrhea complicated by hypovolemic shock.  Colitis on CT scan.  Prior history ulcerative colitis on colonoscopy most recently 2022 by Dr. Bosie Clos.  Suspect ulcerative colitis flare, could have some compounded ischemic colitis.  C. Diff negative, repeat GI pathogen panel negative. Dementia.  Plan:   Continue probiotics and pancreatic enzymes with prn loperamide. If worsening diarrhea, could consider octreotide. Diet as  tolerated. No plans for sigmoidoscopy, colonoscopy or other invasive GI work-up. [facility] will sign-of; please call with questions; thank you for the consultation; patient can follow-up with [place] on prn-basis.   Cynthia Perry 05/19/2022, 11:10 AM   Cell [phone] If no answer or after 5 PM call [phone]

## 2022-05-19 NOTE — Progress Notes (Signed)
Occupational Therapy Treatment Patient Details Name: SUZZANNE PISTORIO MRN: [id number] DOB: September 10, 1945 Today's Date: 05/19/2022   History of present illness 77 y.o. female presents to [facility] hospital on 05/09/2022 with loose watery stools, AMS, hypotension. Pt with recent UTI on bactrim. PMH includes ulcerative colitis, anxiety, depression, alzheimer's disease, HTN, GAD.   OT comments  Pt requiring max to total assist for bed mobility. Sat EOB x 10 minutes with fair balance, flexed posture and B UE propping. Returned to supine with HOB up, pt able to bring cup with straw to mouth to drink when cup placed in her hands, but does not initiate or continue activity. Hand over hand/total assist for combing hair. Goals downgraded.    Recommendations for follow up therapy are one component of a multi-disciplinary discharge planning process, led by the attending physician.  Recommendations may be updated based on patient status, additional functional criteria and insurance authorization.    Assistance Recommended at Discharge Frequent or constant Supervision/Assistance  Patient can return home with the following  Two people to help with walking and/or transfers;A lot of help with bathing/dressing/bathroom;Assistance with cooking/housework;Assistance with feeding;Direct supervision/assist for financial management;Direct supervision/assist for medications management;Help with stairs or ramp for entrance;Assist for transportation   Equipment Recommendations  None recommended by OT    Recommendations for Other Services      Precautions / Restrictions Precautions Precautions: Fall Precaution Comments: rectal tube Restrictions Weight Bearing Restrictions: No       Mobility Bed Mobility Overal bed mobility: Needs Assistance Bed Mobility: Supine to Sit, Sit to Supine     Supine to sit: Max assist, HOB elevated Sit to supine: HOB elevated, Total assist   General bed mobility comments: HOB up, max assist  to pivot to EOB with bed pad, min assist to raise trunk, total assist to guide trunk and for LEs back into be    Transfers                   General transfer comment: did not attempt     Balance Overall balance assessment: Needs assistance Sitting-balance support: Bilateral upper extremity supported, Feet supported Sitting balance-Leahy Scale: Fair Sitting balance - Comments: flexed posture, cannot participate in ADLs and maintain sitting balance simultaneously                                   ADL either performed or assessed with clinical judgement   ADL Overall ADL's : Needs assistance/impaired Eating/Feeding: Moderate assistance;Bed level   Grooming: Brushing hair;Bed level;Total assistance                                 General ADL Comments: minimal initiation of basic grooming tasks and self feeding    Extremity/Trunk Assessment              Vision       Perception     Praxis      Cognition Arousal/Alertness: Awake/alert Behavior During Therapy:  (fidgety) Overall Cognitive Status: History of cognitive impairments - at baseline                                 General Comments: advanced dementia        Exercises      Shoulder Instructions  General Comments      Pertinent Vitals/ Pain       Pain Assessment Pain Assessment: Faces Faces Pain Scale: No hurt  Home Living                                          Prior Functioning/Environment              Frequency  Min 2X/week        Progress Toward Goals  OT Goals(current goals can now be found in the care plan section)  Progress towards OT goals: Not progressing toward goals - comment  Acute Rehab OT Goals OT Goal Formulation: Patient unable to participate in goal setting Time For Goal Achievement: 05/27/22 Potential to Achieve Goals: Poor ADL Goals Pt Will Perform Grooming: with min assist;sitting Pt  Will Perform Upper Body Dressing: with mod assist;sitting Pt Will Transfer to Toilet: with max assist;squat pivot transfer Pt Will Perform Toileting - Clothing Manipulation and hygiene: with mod assist;sitting/lateral leans  Plan Discharge plan remains appropriate    Co-evaluation                 AM-PAC OT "6 Clicks" Daily Activity     Outcome Measure   Help from another person eating meals?: A Lot Help from another person taking care of personal grooming?: Total Help from another person toileting, which includes using toliet, bedpan, or urinal?: Total Help from another person bathing (including washing, rinsing, drying)?: Total Help from another person to put on and taking off regular upper body clothing?: Total Help from another person to put on and taking off regular lower body clothing?: Total 6 Click Score: 7    End of Session    OT Visit Diagnosis: Unsteadiness on feet (R26.81);Muscle weakness (generalized) (M62.81);Other symptoms and signs involving cognitive function   Activity Tolerance     Patient Left in bed;with call bell/phone within reach;with bed alarm set   Nurse Communication          Time: 1210-1230 OT Time Calculation (min): 20 min  Charges: OT General Charges $OT Visit: 1 Visit OT Treatments $Self Care/Home Management : 8-22 mins  Berna Spare, OTR/L Acute Rehabilitation Services Office: [phone]   Evern Bio 05/19/2022, 1:30 PM

## 2022-05-19 NOTE — Progress Notes (Signed)
PROGRESS NOTE        PATIENT DETAILS Name: Cynthia Perry Age: 77 y.o. Sex: female Date of Birth: [birth year] Admit Date: 05/09/2022 Admitting Physician Steffanie Dunn, DO ZOX:[id number], Talmadge Coventry, MD  Brief Summary: Patient is a 77 y.o.  female with history of ulcerative colitis-on mesalamine, advanced dementia-history of recent UTI treated with Bactrim-presented to the hospital on 5/6 with diarrhea/weakness/aggressive behavior-she was subsequently admitted to the hospitalist service.  Hospital course complicated by hypotension requiring pressors and transferred to the ICU.  See below for further details.  Significant events: 5/06>> admit to [facility] 5/07>> transferred to ICU for hypotension-low-dose pressors/stress dose steroids 5/09>> transfer to [facility] 5/13>> hypotensive-responded to IV fluid bolus-transfer to progressive care.  Significant studies: 5/06>> CXR: No active disease 5/06>> CT head: No acute intracranial abnormality.  3.8 soft tissue lesion-left pharyngeal space. 5/06>> CT abdomen/pelvis: Left hemicolon colitis  Significant microbiology data: 5/06>> stool C. difficile: Negative 5/06>> GI pathogen panel: Negative 5/06>> urine culture: E. coli 5/06>> blood culture: Negative 5/13>> GI pathogen panel: Negative  Procedures: None  Consults: PCCM GI  Subjective: No major issues overnight-around 575 cc of stool output in the past 24 hours.  Objective: Vitals: Blood pressure 98/62, pulse 82, temperature 97.7 F (36.5 C), temperature source Axillary, resp. rate 16, height 5\' 3"  (1.6 m), weight 56.9 kg, SpO2 95 %.   Exam: Gen Exam: Confused but not in any distress. HEENT:atraumatic, normocephalic Chest: B/L clear to auscultation anteriorly CVS:S1S2 regular Abdomen:soft non tender, non distended Extremities:no edema Neurology: Difficult exam but moving all 4 extremities.   Skin: no rash  Pertinent Labs/Radiology:    Latest Ref Rng & Units  05/19/2022    4:05 AM 05/18/2022    7:34 AM 05/17/2022    4:59 AM  CBC  WBC 4.0 - 10.5 K/uL 10.6  6.3  4.4   Hemoglobin 12.0 - 15.0 g/dL 40.9  81.1  8.8   Hematocrit 36.0 - 46.0 % 34.7  34.0  28.1   Platelets 150 - 400 K/uL 328  285  170     Lab Results  Component Value Date   NA 134 (L) 05/19/2022   K 3.5 05/19/2022   CL 99 05/19/2022   CO2 26 05/19/2022     Assessment/Plan: Hypovolemic shock due to diarrhea Reoccurred on 5/13-responded to IVF-BP now stable Continue to treat underlying UC flare Continue gentle hydration Completed stress dose steroids  Diarrhea-likely UC flare Stool studies negative Diarrhea seems to be slowing down Mesalamine suppository Oral prednisone-will plan a slow taper Continue Creon Continue scheduled Imodium Discussed with nursing staff-strict monitoring of stool output Given advanced dementia-limited options-will not tolerate any endoscopic evaluation. Discussed with Dr. Ula Lingo MD-if no response to above measures-will require octreotide.  Asymptomatic bacteriuria Urine culture positive for E. coli-UA done on same day-not suggestive of UTI.   Given dementia-history is limited but clinical features are not suggestive of UTI.  Acute metabolic encephalopathy superimposed on advanced dementia Worsening encephalopathy-in the setting of acute hospitalization/high-dose steroids/UC flare Treat underlying UC flare Continue Namenda-low-dose Seroquel Maintain delirium precautions  Hypokalemia GI loss Repleted  Normocytic anemia Secondary to acute illness No evidence of GI bleed/blood loss Follow CBC  3.8 soft tissue lesion-left pharyngeal space Seen incidentally on CT head Discussed with daughter on 5/14-we have decided to not pursue any further workup-as she would not be a candidate  for surgical excision/chemo/radiation if this indeed turned out to be malignant.  Furthermore she is asymptomatic-and this is obviously a incidental finding.     Palliative care DNR Goals of care are for gentle medical treatment-hopefully we can find a way to slow her diarrhea/control colitis-however family aware that if despite our best efforts-she continues to decline/-hypotension etc.-we may need to consider hospice care.   Nutrition Status: Nutrition Problem: Severe Malnutrition Etiology: social / environmental circumstances (inability to self-feed) Signs/Symptoms: severe fat depletion, severe muscle depletion Interventions: Refer to RD note for recommendations  BMI/ Estimated body mass index is 22.22 kg/m as calculated from the following:   Height as of this encounter: 5\' 3"  (1.6 m).   Weight as of this encounter: 56.9 kg.   Code status:   Code Status: DNR   DVT Prophylaxis: heparin injection 5,000 Units Start: 05/10/22 1045 SCDs Start: 05/10/22 4540   Family Communication: Daughter-Karen-[phone]-update over the phone 5/16.   Disposition Plan: Status is: Inpatient Remains inpatient appropriate because: Severity of illness   Planned Discharge Destination:Home health   Diet: Diet Order             Diet regular Room service appropriate? No; Fluid consistency: Thin  Diet effective now                     Antimicrobial agents: Anti-infectives (From admission, onward)    Start     Dose/Rate Route Frequency Ordered Stop   05/10/22 2200  cefTRIAXone (ROCEPHIN) 2 g in sodium chloride 0.9 % 100 mL IVPB  Status:  Discontinued        2 g 200 mL/hr over 30 Minutes Intravenous Every 24 hours 05/10/22 0712 05/11/22 0758   05/10/22 1000  cefTRIAXone (ROCEPHIN) 1 g in sodium chloride 0.9 % 100 mL IVPB  Status:  Discontinued        1 g 200 mL/hr over 30 Minutes Intravenous Every 24 hours 05/10/22 0000 05/10/22 0340   05/10/22 1000  metroNIDAZOLE (FLAGYL) IVPB 500 mg  Status:  Discontinued        500 mg 100 mL/hr over 60 Minutes Intravenous 2 times daily 05/10/22 0700 05/11/22 0758   05/10/22 0700  cefTRIAXone (ROCEPHIN)  1 g in sodium chloride 0.9 % 100 mL IVPB  Status:  Discontinued        1 g 200 mL/hr over 30 Minutes Intravenous  Once 05/10/22 0700 05/10/22 0754   05/10/22 0430  piperacillin-tazobactam (ZOSYN) IVPB 3.375 g  Status:  Discontinued        3.375 g 12.5 mL/hr over 240 Minutes Intravenous Every 8 hours 05/10/22 0429 05/10/22 0604   05/09/22 1945  cefTRIAXone (ROCEPHIN) 1 g in sodium chloride 0.9 % 100 mL IVPB        1 g 200 mL/hr over 30 Minutes Intravenous  Once 05/09/22 1935 05/09/22 2145        MEDICATIONS: Scheduled Meds:  acidophilus  1 capsule Oral TID WC   Chlorhexidine Gluconate Cloth  6 each Topical Daily   fiber supplement (BANATROL TF)  60 mL Oral BID   Gerhardt's butt cream  1 Application Topical BID   heparin  5,000 Units Subcutaneous BID   lipase/protease/amylase  48,000 Units Oral TID AC   loperamide  4 mg Oral TID   memantine  5 mg Oral Daily   mesalamine  1,000 mg Rectal QHS   predniSONE  40 mg Oral Q breakfast   QUEtiapine  12.5 mg Oral QHS   saccharomyces  boulardii  250 mg Oral BID   sodium chloride flush  10-40 mL Intracatheter Q12H   Continuous Infusions:  sodium chloride 50 mL/hr at 05/18/22 1146   sodium chloride Stopped (05/16/22 1558)   PRN Meds:.acetaminophen **OR** acetaminophen, loperamide HCl, Opium, mouth rinse, sodium chloride flush   I have personally reviewed following labs and imaging studies  LABORATORY DATA: CBC: Recent Labs  Lab 05/14/22 0028 05/15/22 0114 05/17/22 0459 05/18/22 0734 05/19/22 0405  WBC 7.2 10.1 4.4 6.3 10.6*  HGB 11.2* 11.4* 8.8* 10.8* 11.5*  HCT 35.1* 35.2* 28.1* 34.0* 34.7*  MCV 80.9 82.1 82.2 81.3 78.9*  PLT 222 212 170 285 328     Basic Metabolic Panel: Recent Labs  Lab 05/13/22 1129 05/14/22 0028 05/15/22 0114 05/17/22 0459 05/18/22 0734 05/19/22 0405  NA  --  137 131* 135 136 134*  K  --  4.6 4.5 3.4* 3.7 3.5  CL  --  105 100 103 104 99  CO2  --  27 21* 24 25 26   GLUCOSE  --  101* 122* 218*  186* 139*  BUN  --  5* 6* 7* 7* 8  CREATININE  --  0.53 0.50 0.54 0.55 0.60  CALCIUM  --  9.6 9.6 9.9 11.1* 10.8*  MG 1.5* 2.1  --   --  2.1  --      GFR: Estimated Creatinine Clearance: 47.9 mL/min (by C-G formula based on SCr of 0.6 mg/dL).  Liver Function Tests: Recent Labs  Lab 05/14/22 0028 05/15/22 0114 05/17/22 0459 05/18/22 0734 05/19/22 0405  AST 23 24 13* 11* 18  ALT 16 21 13 14 18   ALKPHOS 61 67 45 60 65  BILITOT 0.4 0.4 0.3 0.3 0.3  PROT 5.5* 5.4* 4.9* 5.7* 5.8*  ALBUMIN 2.2* 2.0* 2.4* 2.7* 2.6*    No results for input(s): "LIPASE", "AMYLASE" in the last 168 hours. No results for input(s): "AMMONIA" in the last 168 hours.  Coagulation Profile: No results for input(s): "INR", "PROTIME" in the last 168 hours.  Cardiac Enzymes: No results for input(s): "CKTOTAL", "CKMB", "CKMBINDEX", "TROPONINI" in the last 168 hours.  BNP (last 3 results) No results for input(s): "PROBNP" in the last 8760 hours.  Lipid Profile: No results for input(s): "CHOL", "HDL", "LDLCALC", "TRIG", "CHOLHDL", "LDLDIRECT" in the last 72 hours.  Thyroid Function Tests: No results for input(s): "TSH", "T4TOTAL", "FREET4", "T3FREE", "THYROIDAB" in the last 72 hours.  Anemia Panel: No results for input(s): "VITAMINB12", "FOLATE", "FERRITIN", "TIBC", "IRON", "RETICCTPCT" in the last 72 hours.  Urine analysis:    Component Value Date/Time   COLORURINE YELLOW 05/09/2022 1845   APPEARANCEUR HAZY (A) 05/09/2022 1845   LABSPEC 1.012 05/09/2022 1845   PHURINE 6.0 05/09/2022 1845   GLUCOSEU NEGATIVE 05/09/2022 1845   GLUCOSEU NEGATIVE 07/23/2020 1006   HGBUR NEGATIVE 05/09/2022 1845   BILIRUBINUR NEGATIVE 05/09/2022 1845   BILIRUBINUR neg 03/28/2022 1405   KETONESUR NEGATIVE 05/09/2022 1845   PROTEINUR NEGATIVE 05/09/2022 1845   UROBILINOGEN 0.2 03/28/2022 1405   UROBILINOGEN 0.2 07/23/2020 1006   NITRITE POSITIVE (A) 05/09/2022 1845   LEUKOCYTESUR TRACE (A) 05/09/2022 1845     Sepsis Labs: Lactic Acid, Venous    Component Value Date/Time   LATICACIDVEN 2.4 (HH) 05/16/2022 1401    MICROBIOLOGY: Recent Results (from the past 240 hour(s))  Gastrointestinal Panel by PCR , Stool     Status: None   Collection Time: 05/09/22  6:36 PM   Specimen: Stool  Result Value Ref Range Status  Campylobacter species NOT DETECTED NOT DETECTED Final   Plesimonas shigelloides NOT DETECTED NOT DETECTED Final   Salmonella species NOT DETECTED NOT DETECTED Final   Yersinia enterocolitica NOT DETECTED NOT DETECTED Final   Vibrio species NOT DETECTED NOT DETECTED Final   Vibrio cholerae NOT DETECTED NOT DETECTED Final   Enteroaggregative E coli (EAEC) NOT DETECTED NOT DETECTED Final   Enteropathogenic E coli (EPEC) NOT DETECTED NOT DETECTED Final   Enterotoxigenic E coli (ETEC) NOT DETECTED NOT DETECTED Final   Shiga like toxin producing E coli (STEC) NOT DETECTED NOT DETECTED Final   Shigella/Enteroinvasive E coli (EIEC) NOT DETECTED NOT DETECTED Final   Cryptosporidium NOT DETECTED NOT DETECTED Final   Cyclospora cayetanensis NOT DETECTED NOT DETECTED Final   Entamoeba histolytica NOT DETECTED NOT DETECTED Final   Giardia lamblia NOT DETECTED NOT DETECTED Final   Adenovirus F40/41 NOT DETECTED NOT DETECTED Final   Astrovirus NOT DETECTED NOT DETECTED Final   Norovirus GI/GII NOT DETECTED NOT DETECTED Final   Rotavirus A NOT DETECTED NOT DETECTED Final   Sapovirus (I, II, IV, and V) NOT DETECTED NOT DETECTED Final    Comment: Performed at [facility], [address] [place], [place] [address]  C Difficile Quick Screen w PCR reflex     Status: None   Collection Time: 05/09/22  6:36 PM   Specimen: STOOL  Result Value Ref Range Status   C Diff antigen NEGATIVE NEGATIVE Final   C Diff toxin NEGATIVE NEGATIVE Final   C Diff interpretation No C. difficile detected.  Final    Comment: Performed at [facility], [address], [place], [place] [address]   Blood culture (routine x 2)     Status: None   Collection Time: 05/09/22  6:39 PM   Specimen: BLOOD  Result Value Ref Range Status   Specimen Description BLOOD BLOOD LEFT FOREARM  Final   Special Requests   Final    BOTTLES DRAWN AEROBIC AND ANAEROBIC Blood Culture adequate volume   Culture   Final    NO GROWTH 5 DAYS Performed at [facility], [address], [place], [place] [address]    Report Status 05/14/2022 FINAL  Final  Blood culture (routine x 2)     Status: None   Collection Time: 05/09/22  6:45 PM   Specimen: BLOOD  Result Value Ref Range Status   Specimen Description BLOOD BLOOD RIGHT FOREARM  Final   Special Requests   Final    BOTTLES DRAWN AEROBIC AND ANAEROBIC Blood Culture adequate volume   Culture   Final    NO GROWTH 5 DAYS Performed at [facility], [address], [place], [place] [address]    Report Status 05/14/2022 FINAL  Final  Urine Culture     Status: Abnormal   Collection Time: 05/09/22  7:36 PM   Specimen: Urine, Catheterized  Result Value Ref Range Status   Specimen Description URINE, CATHETERIZED  Final   Special Requests   Final    NONE Performed at [facility], [address], [place], [place] [address]    Culture >=100,000 COLONIES/mL ESCHERICHIA COLI (A)  Final   Report Status 05/12/2022 FINAL  Final   Organism ID, Bacteria ESCHERICHIA COLI (A)  Final      Susceptibility   Escherichia coli - MIC*    AMPICILLIN 4 SENSITIVE Sensitive     CEFAZOLIN <=4 SENSITIVE Sensitive     CEFEPIME <=0.12 SENSITIVE Sensitive  CEFTRIAXONE <=0.25 SENSITIVE Sensitive     CIPROFLOXACIN <=0.25 SENSITIVE Sensitive     GENTAMICIN <=1 SENSITIVE Sensitive     IMIPENEM <=0.25 SENSITIVE Sensitive     NITROFURANTOIN <=16 SENSITIVE Sensitive     TRIMETH/SULFA <=20 SENSITIVE Sensitive     AMPICILLIN/SULBACTAM <=2 SENSITIVE Sensitive     PIP/TAZO <=4 SENSITIVE Sensitive     * >=100,000 COLONIES/mL ESCHERICHIA COLI  MRSA Next Gen by PCR,  Nasal     Status: None   Collection Time: 05/10/22  8:54 AM   Specimen: Nasal Mucosa; Nasal Swab  Result Value Ref Range Status   MRSA by PCR Next Gen NOT DETECTED NOT DETECTED Final    Comment: (NOTE) The GeneXpert MRSA Assay (FDA approved for NASAL specimens only), is one component of a comprehensive MRSA colonization surveillance program. It is not intended to diagnose MRSA infection nor to guide or monitor treatment for MRSA infections. Test performance is not FDA approved in patients less than 31 years old. Performed at [facility], [address] [place], [place] [address]   Gastrointestinal Panel by PCR , Stool     Status: None   Collection Time: 05/16/22  4:23 PM   Specimen: Stool  Result Value Ref Range Status   Campylobacter species NOT DETECTED NOT DETECTED Final   Plesimonas shigelloides NOT DETECTED NOT DETECTED Final   Salmonella species NOT DETECTED NOT DETECTED Final   Yersinia enterocolitica NOT DETECTED NOT DETECTED Final   Vibrio species NOT DETECTED NOT DETECTED Final   Vibrio cholerae NOT DETECTED NOT DETECTED Final   Enteroaggregative E coli (EAEC) NOT DETECTED NOT DETECTED Final   Enteropathogenic E coli (EPEC) NOT DETECTED NOT DETECTED Final   Enterotoxigenic E coli (ETEC) NOT DETECTED NOT DETECTED Final   Shiga like toxin producing E coli (STEC) NOT DETECTED NOT DETECTED Final   Shigella/Enteroinvasive E coli (EIEC) NOT DETECTED NOT DETECTED Final   Cryptosporidium NOT DETECTED NOT DETECTED Final   Cyclospora cayetanensis NOT DETECTED NOT DETECTED Final   Entamoeba histolytica NOT DETECTED NOT DETECTED Final   Giardia lamblia NOT DETECTED NOT DETECTED Final   Adenovirus F40/41 NOT DETECTED NOT DETECTED Final   Astrovirus NOT DETECTED NOT DETECTED Final   Norovirus GI/GII NOT DETECTED NOT DETECTED Final   Rotavirus A NOT DETECTED NOT DETECTED Final   Sapovirus (I, II, IV, and V) NOT DETECTED NOT DETECTED Final    Comment: Performed at [facility], [address] [place], [place] [address]    RADIOLOGY STUDIES/RESULTS: No results found.   LOS: 10 days   Jeoffrey Massed, MD  [facility]    To contact the attending provider between 7A-7P or the covering provider during after hours 7P-7A, please log into the web site [email or web address] and access using universal [place] password for that web site. If you do not have the password, please call the hospital operator.  05/19/2022, 11:03 AM

## 2022-05-19 NOTE — Progress Notes (Signed)
Nutrition Follow-up  DOCUMENTATION CODES:   Severe malnutrition in context of social or environmental circumstances, Underweight  INTERVENTION:  Continue Magic cup TID with meals, each supplement provides 290 kcal and 9 grams of protein Ensure Enlive po TID, each supplement provides 350 kcal and 20 grams of protein. Continue Mighty Shake TID with meals, each supplement provides 330 kcals and 9 grams of protein Feeding assist with all meals Continue Banatrol TF via PO - BID  NUTRITION DIAGNOSIS:   Severe Malnutrition related to social / environmental circumstances (inability to self-feed) as evidenced by severe fat depletion, severe muscle depletion. - Ongoing  GOAL:   Patient will meet greater than or equal to 90% of their needs - Ongoing  MONITOR:   PO intake, Supplement acceptance, Weight trends  REASON FOR ASSESSMENT:   Malnutrition Screening Tool    ASSESSMENT:   Pt with hx of ulcerative colitis, HLD, and advanced dementia presented to ED with diarrhea after being treated with antibiotics. Found to be dehydrated on admission.  Pt sitting up in bed, magic cup in front of pt. When asked if she likes them, pt responds yes. Pt unable to give RD any other information about PO intake.  Discussed with RN. RN reports that pt is eating the Magic Cup and Ensure's well, is not eating much actual food though.  Discussed in rounds, pt still having a lot of loose stools. Fecal management system in place.  Meal Intake 5/9-5/14: 10-100% x 5 meals  Medications reviewed and include: Risaquad, Creon, Imodium, Prednisone, Florastor Labs reviewed: Sodium 134, Potassium 3.5   Diet Order:   Diet Order             Diet regular Room service appropriate? No; Fluid consistency: Thin  Diet effective now                   EDUCATION NEEDS:   Not appropriate for education at this time  Skin:  Skin Assessment: Reviewed RN Assessment  Last BM:  5/16 - 575 mL via FMS  Height:    Ht Readings from Last 1 Encounters:  05/09/22 5\' 3"  (1.6 m)    Weight:   Wt Readings from Last 1 Encounters:  05/19/22 56.9 kg    Ideal Body Weight:  52.3 kg  BMI:  Body mass index is 22.22 kg/m.  Estimated Nutritional Needs:   Kcal:  1400-1600 kcal/d  Protein:  65-80g/d  Fluid:  >/=1.5L/d    Kirby Crigler RD, LDN Clinical Dietitian See Loretha Stapler for contact information.

## 2022-05-20 DIAGNOSIS — G9341 Metabolic encephalopathy: Secondary | ICD-10-CM | POA: Diagnosis not present

## 2022-05-20 DIAGNOSIS — E43 Unspecified severe protein-calorie malnutrition: Secondary | ICD-10-CM | POA: Diagnosis not present

## 2022-05-20 DIAGNOSIS — R197 Diarrhea, unspecified: Secondary | ICD-10-CM | POA: Diagnosis not present

## 2022-05-20 DIAGNOSIS — K5130 Ulcerative (chronic) rectosigmoiditis without complications: Secondary | ICD-10-CM | POA: Diagnosis not present

## 2022-05-20 LAB — BASIC METABOLIC PANEL
Anion gap: 8 (ref 5–15)
BUN: 9 mg/dL (ref 8–23)
CO2: 26 mmol/L (ref 22–32)
Calcium: 10.2 mg/dL (ref 8.9–10.3)
Chloride: 101 mmol/L (ref 98–111)
Creatinine, Ser: 0.48 mg/dL (ref 0.44–1.00)
GFR, Estimated: >60 mL/min (ref >60)
Glucose, Bld: 81 mg/dL (ref 70–99)
Potassium: 3.5 mmol/L (ref 3.5–5.1)
Sodium: 135 mmol/L (ref 135–145)

## 2022-05-20 NOTE — Progress Notes (Addendum)
PROGRESS NOTE        PATIENT DETAILS Name: Cynthia Perry Age: 77 y.o. Sex: female Date of Birth: May 24, 1945 Admit Date: 05/09/2022 Admitting Physician Steffanie Dunn, DO AOZ:[id number], Talmadge Coventry, MD  Brief Summary: Patient is a 77 y.o.  female with history of ulcerative colitis-on mesalamine, advanced dementia-history of recent UTI treated with Bactrim-presented to the hospital on 5/6 with diarrhea/weakness/aggressive behavior-she was subsequently admitted to the hospitalist service.  Hospital course complicated by hypotension requiring pressors and transferred to the ICU.  See below for further details.  Significant events: 5/06>> admit to [facility] 5/07>> transferred to ICU for hypotension-low-dose pressors/stress dose steroids 5/09>> transfer to [facility] 5/13>> hypotensive-responded to IV fluid bolus-transfer to progressive care.  Significant studies: 5/06>> CXR: No active disease 5/06>> CT head: No acute intracranial abnormality.  3.8 soft tissue lesion-left pharyngeal space. 5/06>> CT abdomen/pelvis: Left hemicolon colitis  Significant microbiology data: 5/06>> stool C. difficile: Negative 5/06>> GI pathogen panel: Negative 5/06>> urine culture: E. coli 5/06>> blood culture: Negative 5/13>> GI pathogen panel: Negative  Procedures: None  Consults: PCCM GI  Subjective: Pleasantly confused-BP stable for close to 3 days now.  No major issues overnight.  Per nursing staff-stool output is slowly decreasing.  No family at bedside.  Objective: Vitals: Blood pressure 103/61, pulse 77, temperature 98.3 F (36.8 C), temperature source Oral, resp. rate 18, height 5\' 3"  (1.6 m), weight 57.2 kg, SpO2 96 %.   Exam: Gen Exam:Alert awake-not in any distress HEENT:atraumatic, normocephalic Chest: B/L clear to auscultation anteriorly CVS:S1S2 regular Abdomen:soft non tender, non distended Extremities:no edema Neurology: Non focal Skin: no rash  Pertinent  Labs/Radiology:    Latest Ref Rng & Units 05/19/2022    4:05 AM 05/18/2022    7:34 AM 05/17/2022    4:59 AM  CBC  WBC 4.0 - 10.5 K/uL 10.6  6.3  4.4   Hemoglobin 12.0 - 15.0 g/dL 84.6  96.2  8.8   Hematocrit 36.0 - 46.0 % 34.7  34.0  28.1   Platelets 150 - 400 K/uL 328  285  170     Lab Results  Component Value Date   NA 135 05/20/2022   K 3.5 05/20/2022   CL 101 05/20/2022   CO2 26 05/20/2022     Assessment/Plan: Hypovolemic shock due to diarrhea Reoccurred on 5/13-responded to IVF-BP now stable for the past several days Continue to treat underlying UC flare Completed stress dose steroids Will switch IVF to [facility] today and see how she does.  Diarrhea-likely UC flare Stool studies negative Diarrhea seems to be slowing down Continue mesalamine suppository Oral prednisone-will plan a slow taper Continue Creon Continue scheduled Imodium Discussed with nursing staff-strict monitoring of stool output Given advanced dementia-limited options-will not tolerate any endoscopic evaluation. Discussed with Dr. Ula Lingo MD-if no response to above measures-will require octreotide.  Asymptomatic bacteriuria Urine culture positive for E. coli-UA done on same day-not suggestive of UTI.   Given dementia-history is limited but clinical features are not suggestive of UTI.  Acute metabolic encephalopathy superimposed on advanced dementia Worsening encephalopathy-in the setting of acute hospitalization/high-dose steroids/UC flare Treat underlying UC flare Continue Namenda-low-dose Seroquel Maintain delirium precautions  Hypokalemia GI loss Repleted  Normocytic anemia Secondary to acute illness No evidence of GI bleed/blood loss Follow CBC  3.8 soft tissue lesion-left pharyngeal space Seen incidentally on CT head Discussed with  daughter on 5/14-we have decided to not pursue any further workup-as she would not be a candidate for surgical excision/chemo/radiation if this indeed turned  out to be malignant.  Furthermore she is asymptomatic-and this is obviously a incidental finding.    Palliative care DNR Goals of care are for gentle medical treatment-hopefully we can find a way to slow her diarrhea/control colitis-however family aware that if despite our best efforts-she continues to decline/-hypotension etc.-we may need to consider hospice care.   Nutrition Status: Nutrition Problem: Severe Malnutrition Etiology: social / environmental circumstances (inability to self-feed) Signs/Symptoms: severe fat depletion, severe muscle depletion Interventions: Refer to RD note for recommendations  BMI/ Estimated body mass index is 22.34 kg/m as calculated from the following:   Height as of this encounter: 5\' 3"  (1.6 m).   Weight as of this encounter: 57.2 kg.   Code status:   Code Status: DNR   DVT Prophylaxis: heparin injection 5,000 Units Start: 05/10/22 1045 SCDs Start: 05/10/22 1610   Family Communication: Daughter-Karen-[phone]-update over the phone 5/17.   Disposition Plan: Status is: Inpatient Remains inpatient appropriate because: Severity of illness   Planned Discharge Destination:Home health   Diet: Diet Order             Diet regular Room service appropriate? No; Fluid consistency: Thin  Diet effective now                     Antimicrobial agents: Anti-infectives (From admission, onward)    Start     Dose/Rate Route Frequency Ordered Stop   05/10/22 2200  cefTRIAXone (ROCEPHIN) 2 g in sodium chloride 0.9 % 100 mL IVPB  Status:  Discontinued        2 g 200 mL/hr over 30 Minutes Intravenous Every 24 hours 05/10/22 0712 05/11/22 0758   05/10/22 1000  cefTRIAXone (ROCEPHIN) 1 g in sodium chloride 0.9 % 100 mL IVPB  Status:  Discontinued        1 g 200 mL/hr over 30 Minutes Intravenous Every 24 hours 05/10/22 0000 05/10/22 0340   05/10/22 1000  metroNIDAZOLE (FLAGYL) IVPB 500 mg  Status:  Discontinued        500 mg 100 mL/hr over 60  Minutes Intravenous 2 times daily 05/10/22 0700 05/11/22 0758   05/10/22 0700  cefTRIAXone (ROCEPHIN) 1 g in sodium chloride 0.9 % 100 mL IVPB  Status:  Discontinued        1 g 200 mL/hr over 30 Minutes Intravenous  Once 05/10/22 0700 05/10/22 0754   05/10/22 0430  piperacillin-tazobactam (ZOSYN) IVPB 3.375 g  Status:  Discontinued        3.375 g 12.5 mL/hr over 240 Minutes Intravenous Every 8 hours 05/10/22 0429 05/10/22 0604   05/09/22 1945  cefTRIAXone (ROCEPHIN) 1 g in sodium chloride 0.9 % 100 mL IVPB        1 g 200 mL/hr over 30 Minutes Intravenous  Once 05/09/22 1935 05/09/22 2145        MEDICATIONS: Scheduled Meds:  acidophilus  1 capsule Oral TID WC   Chlorhexidine Gluconate Cloth  6 each Topical Daily   feeding supplement  237 mL Oral TID BM   fiber supplement (BANATROL TF)  60 mL Oral BID   Gerhardt's butt cream  1 Application Topical BID   heparin  5,000 Units Subcutaneous BID   lipase/protease/amylase  48,000 Units Oral TID AC   loperamide  4 mg Oral TID   memantine  5 mg Oral Daily  mesalamine  1,000 mg Rectal QHS   predniSONE  40 mg Oral Q breakfast   QUEtiapine  12.5 mg Oral QHS   saccharomyces boulardii  250 mg Oral BID   sodium chloride flush  10-40 mL Intracatheter Q12H   Continuous Infusions:  sodium chloride 50 mL/hr at 05/20/22 0745   sodium chloride Stopped (05/16/22 1558)   PRN Meds:.acetaminophen **OR** acetaminophen, loperamide HCl, Opium, mouth rinse, sodium chloride flush   I have personally reviewed following labs and imaging studies  LABORATORY DATA: CBC: Recent Labs  Lab 05/14/22 0028 05/15/22 0114 05/17/22 0459 05/18/22 0734 05/19/22 0405  WBC 7.2 10.1 4.4 6.3 10.6*  HGB 11.2* 11.4* 8.8* 10.8* 11.5*  HCT 35.1* 35.2* 28.1* 34.0* 34.7*  MCV 80.9 82.1 82.2 81.3 78.9*  PLT 222 212 170 285 328     Basic Metabolic Panel: Recent Labs  Lab 05/13/22 1129 05/14/22 0028 05/14/22 0028 05/15/22 0114 05/17/22 0459 05/18/22 0734  05/19/22 0405 05/20/22 0627  NA  --  137   < > 131* 135 136 134* 135  K  --  4.6   < > 4.5 3.4* 3.7 3.5 3.5  CL  --  105   < > 100 103 104 99 101  CO2  --  27   < > 21* 24 25 26 26   GLUCOSE  --  101*   < > 122* 218* 186* 139* 81  BUN  --  5*   < > 6* 7* 7* 8 9  CREATININE  --  0.53   < > 0.50 0.54 0.55 0.60 0.48  CALCIUM  --  9.6   < > 9.6 9.9 11.1* 10.8* 10.2  MG 1.5* 2.1  --   --   --  2.1  --   --    < > = values in this interval not displayed.     GFR: Estimated Creatinine Clearance: 47.9 mL/min (by C-G formula based on SCr of 0.48 mg/dL).  Liver Function Tests: Recent Labs  Lab 05/14/22 0028 05/15/22 0114 05/17/22 0459 05/18/22 0734 05/19/22 0405  AST 23 24 13* 11* 18  ALT 16 21 13 14 18   ALKPHOS 61 67 45 60 65  BILITOT 0.4 0.4 0.3 0.3 0.3  PROT 5.5* 5.4* 4.9* 5.7* 5.8*  ALBUMIN 2.2* 2.0* 2.4* 2.7* 2.6*    No results for input(s): "LIPASE", "AMYLASE" in the last 168 hours. No results for input(s): "AMMONIA" in the last 168 hours.  Coagulation Profile: No results for input(s): "INR", "PROTIME" in the last 168 hours.  Cardiac Enzymes: No results for input(s): "CKTOTAL", "CKMB", "CKMBINDEX", "TROPONINI" in the last 168 hours.  BNP (last 3 results) No results for input(s): "PROBNP" in the last 8760 hours.  Lipid Profile: No results for input(s): "CHOL", "HDL", "LDLCALC", "TRIG", "CHOLHDL", "LDLDIRECT" in the last 72 hours.  Thyroid Function Tests: No results for input(s): "TSH", "T4TOTAL", "FREET4", "T3FREE", "THYROIDAB" in the last 72 hours.  Anemia Panel: No results for input(s): "VITAMINB12", "FOLATE", "FERRITIN", "TIBC", "IRON", "RETICCTPCT" in the last 72 hours.  Urine analysis:    Component Value Date/Time   COLORURINE YELLOW 05/09/2022 1845   APPEARANCEUR HAZY (A) 05/09/2022 1845   LABSPEC 1.012 05/09/2022 1845   PHURINE 6.0 05/09/2022 1845   GLUCOSEU NEGATIVE 05/09/2022 1845   GLUCOSEU NEGATIVE 07/23/2020 1006   HGBUR NEGATIVE 05/09/2022 1845    BILIRUBINUR NEGATIVE 05/09/2022 1845   BILIRUBINUR neg 03/28/2022 1405   KETONESUR NEGATIVE 05/09/2022 1845   PROTEINUR NEGATIVE 05/09/2022 1845   UROBILINOGEN  0.2 03/28/2022 1405   UROBILINOGEN 0.2 07/23/2020 1006   NITRITE POSITIVE (A) 05/09/2022 1845   LEUKOCYTESUR TRACE (A) 05/09/2022 1845    Sepsis Labs: Lactic Acid, Venous    Component Value Date/Time   LATICACIDVEN 2.4 (HH) 05/16/2022 1401    MICROBIOLOGY: Recent Results (from the past 240 hour(s))  Gastrointestinal Panel by PCR , Stool     Status: None   Collection Time: 05/16/22  4:23 PM   Specimen: Stool  Result Value Ref Range Status   Campylobacter species NOT DETECTED NOT DETECTED Final   Plesimonas shigelloides NOT DETECTED NOT DETECTED Final   Salmonella species NOT DETECTED NOT DETECTED Final   Yersinia enterocolitica NOT DETECTED NOT DETECTED Final   Vibrio species NOT DETECTED NOT DETECTED Final   Vibrio cholerae NOT DETECTED NOT DETECTED Final   Enteroaggregative E coli (EAEC) NOT DETECTED NOT DETECTED Final   Enteropathogenic E coli (EPEC) NOT DETECTED NOT DETECTED Final   Enterotoxigenic E coli (ETEC) NOT DETECTED NOT DETECTED Final   Shiga like toxin producing E coli (STEC) NOT DETECTED NOT DETECTED Final   Shigella/Enteroinvasive E coli (EIEC) NOT DETECTED NOT DETECTED Final   Cryptosporidium NOT DETECTED NOT DETECTED Final   Cyclospora cayetanensis NOT DETECTED NOT DETECTED Final   Entamoeba histolytica NOT DETECTED NOT DETECTED Final   Giardia lamblia NOT DETECTED NOT DETECTED Final   Adenovirus F40/41 NOT DETECTED NOT DETECTED Final   Astrovirus NOT DETECTED NOT DETECTED Final   Norovirus GI/GII NOT DETECTED NOT DETECTED Final   Rotavirus A NOT DETECTED NOT DETECTED Final   Sapovirus (I, II, IV, and V) NOT DETECTED NOT DETECTED Final    Comment: Performed at [facility], [address], [place], [place] [address]    RADIOLOGY STUDIES/RESULTS: No results found.   LOS: 11 days    Jeoffrey Massed, MD  [facility]    To contact the attending provider between 7A-7P or the covering provider during after hours 7P-7A, please log into the web site [email or web address] and access using universal  password for that web site. If you do not have the password, please call the hospital operator.  05/20/2022, 11:28 AM

## 2022-05-21 DIAGNOSIS — G9341 Metabolic encephalopathy: Secondary | ICD-10-CM | POA: Diagnosis not present

## 2022-05-21 LAB — BRAIN NATRIURETIC PEPTIDE: B Natriuretic Peptide: 137.4 pg/mL — ABNORMAL HIGH (ref 0.0–100.0)

## 2022-05-21 LAB — CBC WITH DIFFERENTIAL/PLATELET
Abs Immature Granulocytes: 0.02 10*3/uL (ref 0.00–0.07)
Basophils Absolute: 0 10*3/uL (ref 0.0–0.1)
Basophils Relative: 0 %
Eosinophils Absolute: 0 10*3/uL (ref 0.0–0.5)
Eosinophils Relative: 1 %
HCT: 34 % — ABNORMAL LOW (ref 36.0–46.0)
Hemoglobin: 10.9 g/dL — ABNORMAL LOW (ref 12.0–15.0)
Immature Granulocytes: 1 %
Lymphocytes Relative: 19 %
Lymphs Abs: 0.8 10*3/uL (ref 0.7–4.0)
MCH: 26.4 pg (ref 26.0–34.0)
MCHC: 32.1 g/dL (ref 30.0–36.0)
MCV: 82.3 fL (ref 80.0–100.0)
Monocytes Absolute: 0.4 10*3/uL (ref 0.1–1.0)
Monocytes Relative: 11 %
Neutro Abs: 2.8 10*3/uL (ref 1.7–7.7)
Neutrophils Relative %: 68 %
Platelets: 281 10*3/uL (ref 150–400)
RBC: 4.13 MIL/uL (ref 3.87–5.11)
RDW: 17.2 % — ABNORMAL HIGH (ref 11.5–15.5)
WBC: 4 10*3/uL (ref 4.0–10.5)
nRBC: 0 % (ref 0.0–0.2)

## 2022-05-21 LAB — BASIC METABOLIC PANEL
Anion gap: 6 (ref 5–15)
BUN: 7 mg/dL — ABNORMAL LOW (ref 8–23)
CO2: 32 mmol/L (ref 22–32)
Calcium: 10.5 mg/dL — ABNORMAL HIGH (ref 8.9–10.3)
Chloride: 98 mmol/L (ref 98–111)
Creatinine, Ser: 0.57 mg/dL (ref 0.44–1.00)
GFR, Estimated: >60 mL/min (ref >60)
Glucose, Bld: 100 mg/dL — ABNORMAL HIGH (ref 70–99)
Potassium: 3.6 mmol/L (ref 3.5–5.1)
Sodium: 136 mmol/L (ref 135–145)

## 2022-05-21 LAB — CALPROTECTIN, FECAL: Calprotectin, Fecal: 4370 ug/g — ABNORMAL HIGH (ref 0–120)

## 2022-05-21 LAB — MAGNESIUM: Magnesium: 2.2 mg/dL (ref 1.7–2.4)

## 2022-05-21 NOTE — Progress Notes (Addendum)
PROGRESS NOTE        PATIENT DETAILS Name: Cynthia Perry Age: 77 y.o. Sex: female Date of Birth: 05-27-1945 Admit Date: 05/09/2022 Admitting Physician Steffanie Dunn, DO [id number], Talmadge Coventry, MD  Brief Summary: Patient is a 77 y.o.  female with history of ulcerative colitis-on mesalamine, advanced dementia-history of recent UTI treated with Bactrim-presented to the hospital on 5/6 with diarrhea/weakness/aggressive behavior-she was subsequently admitted to the hospitalist service.  Hospital course complicated by hypotension requiring pressors and transferred to the ICU.  See below for further details.  Significant events: 5/06>> admit to [facility] 5/07>> transferred to ICU for hypotension-low-dose pressors/stress dose steroids 5/09>> transfer to [facility] 5/13>> hypotensive-responded to IV fluid bolus-transfer to progressive care.  Significant studies: 5/06>> CXR: No active disease 5/06>> CT head: No acute intracranial abnormality.  3.8 soft tissue lesion-left pharyngeal space. 5/06>> CT abdomen/pelvis: Left hemicolon colitis  Significant microbiology data: 5/06>> stool C. difficile: Negative 5/06>> GI pathogen panel: Negative 5/06>> urine culture: E. coli 5/06>> blood culture: Negative 5/13>> GI pathogen panel: Negative  Procedures: None  Consults: PCCM GI  Subjective:   Pleasantly confused-in bed in no distress, denies any headache, no chest or abdominal pain.  Objective: Vitals: Blood pressure 114/61, pulse 70, temperature 97.6 F (36.4 C), temperature source Axillary, resp. rate 18, height 5\' 3"  (1.6 m), weight 57.2 kg, SpO2 97 %.   Exam:  Awake but confused, No new F.N deficits,   [place].AT,PERRAL Supple Neck, No JVD,   Symmetrical Chest wall movement, Good air movement bilaterally, CTAB RRR,No Gallops, Rubs or new Murmurs,  +ve B.Sounds, Abd Soft, No tenderness,   No Cyanosis, Clubbing or edema    Assessment/Plan:  Hypovolemic shock due to  diarrhea Reoccurred on 5/13-responded to IVF-BP now stable for the past several days Continue to treat underlying UC flare Completed stress dose steroids Will switch IVF to [facility] today and see how she does.  Diarrhea-likely UC flare Stool studies negative Diarrhea seems to be slowing down Continue mesalamine suppository Oral prednisone-will plan a slow taper Continue Creon Continue scheduled Imodium Discussed with nursing staff-strict monitoring of stool output Given advanced dementia-limited options-will not tolerate any endoscopic evaluation. Discussed with Dr. Ula Lingo MD-if no response to above measures-will require octreotide.  Asymptomatic bacteriuria Urine culture positive for E. coli-UA done on same day-not suggestive of UTI.   Given dementia-history is limited but clinical features are not suggestive of UTI.  Acute metabolic encephalopathy superimposed on advanced dementia Worsening encephalopathy-in the setting of acute hospitalization/high-dose steroids/UC flare Treat underlying UC flare Continue Namenda-low-dose Seroquel Maintain delirium precautions  Hypokalemia GI loss Repleted  Normocytic anemia Secondary to acute illness No evidence of GI bleed/blood loss Follow CBC  3.8 soft tissue lesion-left pharyngeal space Seen incidentally on CT head Discussed with daughter on 5/14-we have decided to not pursue any further workup-as she would not be a candidate for surgical excision/chemo/radiation if this indeed turned out to be malignant.  Furthermore she is asymptomatic-and this is obviously a incidental finding.    Palliative care DNR Goals of care are for gentle medical treatment-hopefully we can find a way to slow her diarrhea/control colitis-however family aware that if despite our best efforts-she continues to decline/-hypotension etc.-we may need to consider hospice care.   Nutrition Status: Nutrition Problem: Severe Malnutrition Etiology: social /  environmental circumstances (inability to self-feed) Signs/Symptoms: severe fat depletion,  severe muscle depletion Interventions: Refer to RD note for recommendations  BMI/ Estimated body mass index is 22.34 kg/m as calculated from the following:   Height as of this encounter: 5\' 3"  (1.6 m).   Weight as of this encounter: 57.2 kg.    Code status:   Code Status: DNR    DVT Prophylaxis: heparin injection 5,000 Units Start: 05/10/22 1045 SCDs Start: 05/10/22 1610    Family Communication: Daughter-Karen-[phone]-update over the phone 05/21/22.   Disposition Plan: Status is: Inpatient Remains inpatient appropriate because: Severity of illness   Planned Discharge Destination:Home health   Diet: Diet Order             Diet regular Room service appropriate? No; Fluid consistency: Thin  Diet effective now                   MEDICATIONS: Scheduled Meds:  acidophilus  1 capsule Oral TID WC   Chlorhexidine Gluconate Cloth  6 each Topical Daily   feeding supplement  237 mL Oral TID BM   fiber supplement (BANATROL TF)  60 mL Oral BID   Gerhardt's butt cream  1 Application Topical BID   heparin  5,000 Units Subcutaneous BID   lipase/protease/amylase  48,000 Units Oral TID AC   loperamide  4 mg Oral TID   memantine  5 mg Oral Daily   mesalamine  1,000 mg Rectal QHS   predniSONE  40 mg Oral Q breakfast   QUEtiapine  12.5 mg Oral QHS   saccharomyces boulardii  250 mg Oral BID   Continuous Infusions:   PRN Meds:.acetaminophen **OR** acetaminophen, loperamide HCl, Opium, mouth rinse, sodium chloride flush   I have personally reviewed following labs and imaging studies  LABORATORY DATA:  Recent Labs  Lab 05/15/22 0114 05/17/22 0459 05/18/22 0734 05/19/22 0405 05/21/22 0330  WBC 10.1 4.4 6.3 10.6* 4.0  HGB 11.4* 8.8* 10.8* 11.5* 10.9*  HCT 35.2* 28.1* 34.0* 34.7* 34.0*  PLT 212 170 285 328 281  MCV 82.1 82.2 81.3 78.9* 82.3  MCH 26.6 25.7* 25.8* 26.1 26.4   MCHC 32.4 31.3 31.8 33.1 32.1  RDW 17.0* 16.6* 16.9* 16.7* 17.2*  LYMPHSABS  --   --   --   --  0.8  MONOABS  --   --   --   --  0.4  EOSABS  --   --   --   --  0.0  BASOSABS  --   --   --   --  0.0    Recent Labs  Lab 05/15/22 0114 05/16/22 1101 05/16/22 1401 05/17/22 0459 05/18/22 0734 05/19/22 0405 05/20/22 0627 05/21/22 0330  NA 131*  --   --  135 136 134* 135 136  K 4.5  --   --  3.4* 3.7 3.5 3.5 3.6  CL 100  --   --  103 104 99 101 98  CO2 21*  --   --  24 25 26 26  32  ANIONGAP 10  --   --  8 7 9 8 6   GLUCOSE 122*  --   --  218* 186* 139* 81 100*  BUN 6*  --   --  7* 7* 8 9 7*  CREATININE 0.50  --   --  0.54 0.55 0.60 0.48 0.57  AST 24  --   --  13* 11* 18  --   --   ALT 21  --   --  13 14 18   --   --  ALKPHOS 67  --   --  45 60 65  --   --   BILITOT 0.4  --   --  0.3 0.3 0.3  --   --   ALBUMIN 2.0*  --   --  2.4* 2.7* 2.6*  --   --   LATICACIDVEN  --  2.5* 2.4*  --   --   --   --   --   BNP  --   --   --   --   --   --   --  137.4*  MG  --   --   --   --  2.1  --   --  2.2  CALCIUM 9.6  --   --  9.9 11.1* 10.8* 10.2 10.5*    RADIOLOGY STUDIES/RESULTS: No results found.   LOS: 12 days  Signature  -    Susa Raring M.D on 05/21/2022 at 9:18 AM   -  To page go to [email or web address]

## 2022-05-22 DIAGNOSIS — G9341 Metabolic encephalopathy: Secondary | ICD-10-CM | POA: Diagnosis not present

## 2022-05-22 LAB — CBC WITH DIFFERENTIAL/PLATELET
Abs Immature Granulocytes: 0.09 10*3/uL — ABNORMAL HIGH (ref 0.00–0.07)
Basophils Absolute: 0 10*3/uL (ref 0.0–0.1)
Basophils Relative: 0 %
Eosinophils Absolute: 0 10*3/uL (ref 0.0–0.5)
Eosinophils Relative: 0 %
HCT: 34.6 % — ABNORMAL LOW (ref 36.0–46.0)
Hemoglobin: 11.2 g/dL — ABNORMAL LOW (ref 12.0–15.0)
Immature Granulocytes: 2 %
Lymphocytes Relative: 7 %
Lymphs Abs: 0.3 10*3/uL — ABNORMAL LOW (ref 0.7–4.0)
MCH: 26.2 pg (ref 26.0–34.0)
MCHC: 32.4 g/dL (ref 30.0–36.0)
MCV: 80.8 fL (ref 80.0–100.0)
Monocytes Absolute: 0.3 10*3/uL (ref 0.1–1.0)
Monocytes Relative: 8 %
Neutro Abs: 3.4 10*3/uL (ref 1.7–7.7)
Neutrophils Relative %: 83 %
Platelets: 347 10*3/uL (ref 150–400)
RBC: 4.28 MIL/uL (ref 3.87–5.11)
RDW: 17 % — ABNORMAL HIGH (ref 11.5–15.5)
WBC: 4.1 10*3/uL (ref 4.0–10.5)
nRBC: 0 % (ref 0.0–0.2)

## 2022-05-22 LAB — BASIC METABOLIC PANEL
Anion gap: 9 (ref 5–15)
BUN: 13 mg/dL (ref 8–23)
CO2: 30 mmol/L (ref 22–32)
Calcium: 10.5 mg/dL — ABNORMAL HIGH (ref 8.9–10.3)
Chloride: 95 mmol/L — ABNORMAL LOW (ref 98–111)
Creatinine, Ser: 0.58 mg/dL (ref 0.44–1.00)
GFR, Estimated: >60 mL/min (ref >60)
Glucose, Bld: 217 mg/dL — ABNORMAL HIGH (ref 70–99)
Potassium: 3.9 mmol/L (ref 3.5–5.1)
Sodium: 134 mmol/L — ABNORMAL LOW (ref 135–145)

## 2022-05-22 LAB — BRAIN NATRIURETIC PEPTIDE: B Natriuretic Peptide: 173.9 pg/mL — ABNORMAL HIGH (ref 0.0–100.0)

## 2022-05-22 LAB — MAGNESIUM: Magnesium: 2.3 mg/dL (ref 1.7–2.4)

## 2022-05-22 MED ORDER — LOPERAMIDE HCL 2 MG PO CAPS
4.0000 mg | ORAL_CAPSULE | Freq: Four times a day (QID) | ORAL | Status: DC
  Administered 2022-05-22 – 2022-05-23 (×5): 4 mg via ORAL
  Filled 2022-05-22 (×5): qty 2

## 2022-05-22 MED ORDER — OPIUM 10 MG/ML (1%) PO TINC
2.0000 mL | Freq: Three times a day (TID) | ORAL | Status: DC
  Administered 2022-05-22 – 2022-05-23 (×4): 20 mg via ORAL
  Filled 2022-05-22 (×4): qty 2

## 2022-05-22 NOTE — Progress Notes (Signed)
PROGRESS NOTE        PATIENT DETAILS Name: Cynthia Perry Age: 77 y.o. Sex: female Date of Birth: 1945-01-23 Admit Date: 05/09/2022 Admitting Physician Steffanie Dunn, DO ZOX:[id number], Talmadge Coventry, MD  Brief Summary: Patient is a 77 y.o.  female with history of ulcerative colitis-on mesalamine, advanced dementia-history of recent UTI treated with Bactrim-presented to the hospital on 5/6 with diarrhea/weakness/aggressive behavior-she was subsequently admitted to the hospitalist service.  Hospital course complicated by hypotension requiring pressors and transferred to the ICU.  See below for further details.  Significant events: 5/06>> admit to [facility] 5/07>> transferred to ICU for hypotension-low-dose pressors/stress dose steroids 5/09>> transfer to [facility] 5/13>> hypotensive-responded to IV fluid bolus-transfer to progressive care.  Significant studies: 5/06>> CXR: No active disease 5/06>> CT head: No acute intracranial abnormality.  3.8 soft tissue lesion-left pharyngeal space. 5/06>> CT abdomen/pelvis: Left hemicolon colitis  Significant microbiology data: 5/06>> stool C. difficile: Negative 5/06>> GI pathogen panel: Negative 5/06>> urine culture: E. coli 5/06>> blood culture: Negative 5/13>> GI pathogen panel: Negative  Procedures: None  Consults: PCCM GI  Subjective:   Pleasantly confused-in bed in no distress, denies any headache, no chest or abdominal pain.  Objective: Vitals: Blood pressure 133/79, pulse 80, temperature 98.2 F (36.8 C), temperature source Oral, resp. rate 16, height 5\' 3"  (1.6 m), weight 57.3 kg, SpO2 98 %.   Exam:  Awake but confused, No new F.N deficits,   [place].AT,PERRAL Supple Neck, No JVD,   Symmetrical Chest wall movement, Good air movement bilaterally, CTAB RRR,No Gallops, Rubs or new Murmurs,  +ve B.Sounds, Abd Soft, No tenderness,   No Cyanosis, Clubbing or edema    Assessment/Plan:  Hypovolemic shock due to  diarrhea Reoccurred on 5/13-responded to IVF-BP now stable for the past several days Continue to treat underlying UC flare Completed stress dose steroids IV fluids on hold since 05/20/2022 with electrolytes stable so far will be monitored closely.  Diarrhea-likely UC flare Stool studies negative Diarrhea off-and-on still significant now placed on scheduled Imodium and tincture of opium, dose adjusted on 05/22/2022, as needed Imodium also continued Continue mesalamine suppository Oral prednisone-will plan a slow taper Continue Creon Discussed with nursing staff-strict monitoring of stool output Given advanced dementia-limited options-will not tolerate any endoscopic evaluation. Discussed with Dr. Ula Lingo MD-if no response to above measures-will require octreotide.  Asymptomatic bacteriuria Urine culture positive for E. coli-UA done on same day-not suggestive of UTI.   Given dementia-history is limited but clinical features are not suggestive of UTI.  Acute metabolic encephalopathy superimposed on advanced dementia Worsening encephalopathy-in the setting of acute hospitalization/high-dose steroids/UC flare Treat underlying UC flare Continue Namenda-low-dose Seroquel Maintain delirium precautions  Hypokalemia GI loss Repleted  Normocytic anemia Secondary to acute illness No evidence of GI bleed/blood loss Follow CBC  3.8 soft tissue lesion-left pharyngeal space Seen incidentally on CT head Discussed with daughter on 5/14-we have decided to not pursue any further workup-as she would not be a candidate for surgical excision/chemo/radiation if this indeed turned out to be malignant.  Furthermore she is asymptomatic-and this is obviously a incidental finding.    Palliative care DNR Goals of care are for gentle medical treatment-hopefully we can find a way to slow her diarrhea/control colitis-however family aware that if despite our best efforts-she continues to decline/-hypotension  etc.-we may need to consider hospice care.   Nutrition  Status: Nutrition Problem: Severe Malnutrition Etiology: social / environmental circumstances (inability to self-feed) Signs/Symptoms: severe fat depletion, severe muscle depletion Interventions: Refer to RD note for recommendations  BMI/ Estimated body mass index is 22.38 kg/m as calculated from the following:   Height as of this encounter: 5\' 3"  (1.6 m).   Weight as of this encounter: 57.3 kg.    Code status:   Code Status: DNR    DVT Prophylaxis: heparin injection 5,000 Units Start: 05/10/22 1045 SCDs Start: 05/10/22 1610    Family Communication: Daughter-Karen-[phone]-update over the phone 05/21/22 message left at 8 AM, 05/22/2022 message left at 8:04 AM   Disposition Plan: Status is: Inpatient Remains inpatient appropriate because: Severity of illness   Planned Discharge Destination:Home health   Diet: Diet Order             Diet regular Room service appropriate? No; Fluid consistency: Thin  Diet effective now                   MEDICATIONS: Scheduled Meds:  acidophilus  1 capsule Oral TID WC   Chlorhexidine Gluconate Cloth  6 each Topical Daily   feeding supplement  237 mL Oral TID BM   fiber supplement (BANATROL TF)  60 mL Oral BID   Gerhardt's butt cream  1 Application Topical BID   heparin  5,000 Units Subcutaneous BID   lipase/protease/amylase  48,000 Units Oral TID AC   loperamide  4 mg Oral Q6H   memantine  5 mg Oral Daily   mesalamine  1,000 mg Rectal QHS   Opium  2 mL Oral TID   predniSONE  40 mg Oral Q breakfast   QUEtiapine  12.5 mg Oral QHS   saccharomyces boulardii  250 mg Oral BID   Continuous Infusions:   PRN Meds:.acetaminophen **OR** acetaminophen, loperamide HCl, mouth rinse, sodium chloride flush   I have personally reviewed following labs and imaging studies  LABORATORY DATA:  Recent Labs  Lab 05/17/22 0459 05/18/22 0734 05/19/22 0405 05/21/22 0330  05/22/22 0355  WBC 4.4 6.3 10.6* 4.0 4.1  HGB 8.8* 10.8* 11.5* 10.9* 11.2*  HCT 28.1* 34.0* 34.7* 34.0* 34.6*  PLT 170 285 328 281 347  MCV 82.2 81.3 78.9* 82.3 80.8  MCH 25.7* 25.8* 26.1 26.4 26.2  MCHC 31.3 31.8 33.1 32.1 32.4  RDW 16.6* 16.9* 16.7* 17.2* 17.0*  LYMPHSABS  --   --   --  0.8 0.3*  MONOABS  --   --   --  0.4 0.3  EOSABS  --   --   --  0.0 0.0  BASOSABS  --   --   --  0.0 0.0    Recent Labs  Lab 05/16/22 1101 05/16/22 1401 05/17/22 0459 05/17/22 0459 05/18/22 0734 05/19/22 0405 05/20/22 0627 05/21/22 0330 05/22/22 0355  NA  --   --  135   < > 136 134* 135 136 134*  K  --   --  3.4*   < > 3.7 3.5 3.5 3.6 3.9  CL  --   --  103   < > 104 99 101 98 95*  CO2  --   --  24   < > 25 26 26  32 30  ANIONGAP  --   --  8   < > 7 9 8 6 9   GLUCOSE  --   --  218*   < > 186* 139* 81 100* 217*  BUN  --   --  7*   < >  7* 8 9 7* 13  CREATININE  --   --  0.54   < > 0.55 0.60 0.48 0.57 0.58  AST  --   --  13*  --  11* 18  --   --   --   ALT  --   --  13  --  14 18  --   --   --   ALKPHOS  --   --  45  --  60 65  --   --   --   BILITOT  --   --  0.3  --  0.3 0.3  --   --   --   ALBUMIN  --   --  2.4*  --  2.7* 2.6*  --   --   --   LATICACIDVEN 2.5* 2.4*  --   --   --   --   --   --   --   BNP  --   --   --   --   --   --   --  137.4* 173.9*  MG  --   --   --   --  2.1  --   --  2.2 2.3  CALCIUM  --   --  9.9   < > 11.1* 10.8* 10.2 10.5* 10.5*   < > = values in this interval not displayed.    RADIOLOGY STUDIES/RESULTS: No results found.   LOS: 13 days  Signature  -    Susa Raring M.D on 05/22/2022 at 8:04 AM   -  To page go to [email or web address]

## 2022-05-22 NOTE — TOC Initial Note (Signed)
Transition of Care [facility]) - Initial/Assessment Note    Patient Details  Name: Cynthia Perry MRN: [id number] Date of Birth: October 02, 1945  [facility] CM/SW Contact:    Lawerance Sabal, RN Phone Number: 05/22/2022, 3:10 PM  Clinical Narrative:                  Sherron Monday w patient's daughter/ caregiver, Clydie Braun.  She confirms that patient is from home, lives with spouse and has significant support from two daughters and son, daily visits to prep food, meds, clean, run errands.  Home is WC accessible, spouse uses WC.  Patient has DME 3/1 and RW at home, no new DME needs verbalized or assessed.  Clydie Braun states that patient can transport with them by private car when discharged. Discussed HH support and they had [facility] last year and would like to use them again, referral accepted by Barbara Cower.       Renaldo Harrison [phone])   Expected Discharge Plan: Home w Home Health Services Barriers to Discharge: Continued Medical Work up   Patient Goals and CMS Choice Patient states their goals for this hospitalization and ongoing recovery are:: to go home CMS Medicare.gov Compare Post Acute Care list provided to:: Other (Comment Required) Choice offered to / list presented to : Adult Children      Expected Discharge Plan and Services   Discharge Planning Services: CM Consult Post Acute Care Choice: Home Health Living arrangements for the past 2 months: Single Family Home                 DME Arranged: N/A         HH Arranged: PT, OT HH Agency: [facility] ([facility]) Date HH Agency Contacted: 05/22/22 Time HH Agency Contacted: 1510 Representative spoke with at [facility] Agency: Barbara Cower  Prior Living Arrangements/Services Living arrangements for the past 2 months: Single Family Home Lives with:: Spouse   Do you feel safe going back to the place where you live?: Yes          Current home services: DME    Activities of Daily Living Home Assistive Devices/Equipment: Environmental consultant  (specify type) ADL Screening (condition at time of admission) Patient's cognitive ability adequate to safely complete daily activities?: No Is the patient deaf or have difficulty hearing?: No Does the patient have difficulty seeing, even when wearing glasses/contacts?: No Does the patient have difficulty concentrating, remembering, or making decisions?: Yes Patient able to express need for assistance with ADLs?: Yes Does the patient have difficulty dressing or bathing?: Yes Independently performs ADLs?: No Communication: Independent Dressing (OT): Dependent Is this a change from baseline?: Pre-admission baseline Grooming: Dependent Is this a change from baseline?: Pre-admission baseline Feeding: Dependent Is this a change from baseline?: Pre-admission baseline Bathing: Dependent Is this a change from baseline?: Pre-admission baseline Toileting: Dependent Is this a change from baseline?: Pre-admission baseline In/Out Bed: Dependent Is this a change from baseline?: Pre-admission baseline Walks in Home: Dependent Is this a change from baseline?: Pre-admission baseline Does the patient have difficulty walking or climbing stairs?: Yes Weakness of Legs: Both Weakness of Arms/Hands: Both  Permission Sought/Granted                  Emotional Assessment              Admission diagnosis:  Colitis [K52.9] Shock (HCC) [R57.9] Encephalopathy [G93.40] Failure to thrive in adult [R62.7] Acute metabolic encephalopathy [G93.41] Patient Active Problem List   Diagnosis Date Noted  Shock (HCC) 05/10/2022   Acute metabolic encephalopathy 05/09/2022   Acute cystitis 05/09/2022   Diarrhea 05/09/2022   Moderate dementia (HCC) 09/08/2021   UTI symptoms 09/08/2021   Hypoalbuminemia 06/02/2021   Hyperthyroidism 05/31/2021   Hypokalemia, hypomagnesemia, hypophosphatemia, hyponatremia 05/30/2021   Hypophosphatemia 05/30/2021   Ulcerative colitis without complications (HCC) 05/30/2021    Bandemia 05/30/2021   Delirium 05/29/2021   Hypokalemia 05/29/2021   Hypomagnesemia 05/29/2021   Dehydration 05/29/2021   Leukocytosis 05/29/2021   Protein-calorie malnutrition, severe (HCC) 05/29/2021   Weight loss 05/10/2021   Xerostomia 05/10/2021   Major neurocognitive disorder due to Alzheimer's disease 12/30/2020   Left upper quadrant abdominal mass    Generalized abdominal pain    Hypercalcemia    Left sided ulcerative colitis    Tachycardia    Hypotension due to drugs    Generalized anxiety disorder    History of ASCVD    Vitamin B12 deficiency    Debilitated    PCP:  Mliss Sax, MD Pharmacy:   [facility] [phone] [address], [place] - [address] AT [facility] [address] [place] [address] Phone: [phone] Fax: [phone]     Social Determinants of Health (SDOH) Social History: SDOH Screenings   Food Insecurity: Patient Unable To Answer (05/10/2022)  Transportation Needs: Patient Unable To Answer (05/10/2022)  Utilities: Patient Unable To Answer (05/10/2022)  Depression (PHQ2-9): Low Risk  (03/28/2022)  Tobacco Use: Low Risk  (05/09/2022)   SDOH Interventions: Food Insecurity Interventions: Intervention Not Indicated   Readmission Risk Interventions     No data to display

## 2022-05-23 DIAGNOSIS — G9341 Metabolic encephalopathy: Secondary | ICD-10-CM | POA: Diagnosis not present

## 2022-05-23 LAB — BASIC METABOLIC PANEL
Anion gap: 8 (ref 5–15)
BUN: 22 mg/dL (ref 8–23)
CO2: 36 mmol/L — ABNORMAL HIGH (ref 22–32)
Calcium: 11 mg/dL — ABNORMAL HIGH (ref 8.9–10.3)
Chloride: 94 mmol/L — ABNORMAL LOW (ref 98–111)
Creatinine, Ser: 0.72 mg/dL (ref 0.44–1.00)
GFR, Estimated: >60 mL/min (ref >60)
Glucose, Bld: 149 mg/dL — ABNORMAL HIGH (ref 70–99)
Potassium: 3.5 mmol/L (ref 3.5–5.1)
Sodium: 138 mmol/L (ref 135–145)

## 2022-05-23 LAB — BRAIN NATRIURETIC PEPTIDE: B Natriuretic Peptide: 116.4 pg/mL — ABNORMAL HIGH (ref 0.0–100.0)

## 2022-05-23 LAB — CBC WITH DIFFERENTIAL/PLATELET
Abs Immature Granulocytes: 0.09 10*3/uL — ABNORMAL HIGH (ref 0.00–0.07)
Basophils Absolute: 0 10*3/uL (ref 0.0–0.1)
Basophils Relative: 0 %
Eosinophils Absolute: 0 10*3/uL (ref 0.0–0.5)
Eosinophils Relative: 0 %
HCT: 31.8 % — ABNORMAL LOW (ref 36.0–46.0)
Hemoglobin: 10.1 g/dL — ABNORMAL LOW (ref 12.0–15.0)
Immature Granulocytes: 2 %
Lymphocytes Relative: 12 %
Lymphs Abs: 0.7 10*3/uL (ref 0.7–4.0)
MCH: 26.4 pg (ref 26.0–34.0)
MCHC: 31.8 g/dL (ref 30.0–36.0)
MCV: 83.2 fL (ref 80.0–100.0)
Monocytes Absolute: 0.8 10*3/uL (ref 0.1–1.0)
Monocytes Relative: 13 %
Neutro Abs: 4.5 10*3/uL (ref 1.7–7.7)
Neutrophils Relative %: 73 %
Platelets: 339 10*3/uL (ref 150–400)
RBC: 3.82 MIL/uL — ABNORMAL LOW (ref 3.87–5.11)
RDW: 17.9 % — ABNORMAL HIGH (ref 11.5–15.5)
WBC: 6.1 10*3/uL (ref 4.0–10.5)
nRBC: 0 % (ref 0.0–0.2)

## 2022-05-23 LAB — MAGNESIUM: Magnesium: 2.5 mg/dL — ABNORMAL HIGH (ref 1.7–2.4)

## 2022-05-23 MED ORDER — OPIUM 10 MG/ML (1%) PO TINC
2.0000 mL | Freq: Two times a day (BID) | ORAL | Status: DC
  Administered 2022-05-23 – 2022-05-25 (×4): 20 mg via ORAL
  Filled 2022-05-23 (×5): qty 2

## 2022-05-23 MED ORDER — LACTATED RINGERS IV BOLUS
1000.0000 mL | Freq: Once | INTRAVENOUS | Status: AC
  Administered 2022-05-23: 1000 mL via INTRAVENOUS

## 2022-05-23 MED ORDER — LOPERAMIDE HCL 2 MG PO CAPS
4.0000 mg | ORAL_CAPSULE | Freq: Three times a day (TID) | ORAL | Status: DC
  Administered 2022-05-23 (×2): 4 mg via ORAL
  Filled 2022-05-23 (×2): qty 2

## 2022-05-23 MED ORDER — POTASSIUM CHLORIDE 20 MEQ PO PACK
40.0000 meq | PACK | Freq: Once | ORAL | Status: AC
  Administered 2022-05-23: 40 meq via ORAL
  Filled 2022-05-23: qty 2

## 2022-05-23 NOTE — Progress Notes (Signed)
Occupational Therapy Treatment Patient Details Name: Cynthia Perry MRN: [id number] DOB: 1945-07-06 Today's Date: 05/23/2022   History of present illness 77 y.o. female presents to [facility] hospital on 05/09/2022 with loose watery stools, AMS, hypotension. Pt with recent UTI on bactrim. PMH includes ulcerative colitis, anxiety, depression, alzheimer's disease, HTN, GAD.   OT comments  Pt lethargic with eyes closed majority of session. Not progressing toward goals due to lethargy and cognitive deficits. When asked if she wanted to sit she responds "yes" but does not demonstrate initiation. Total A for all bed mobility and ADL this date. Pt will sit upright in bed, however falls toward R/L requiring total A to prevent fall from bed. Attempted to use music to engage pt. Dangled EOB however not attempting to stand. Hand over hand to take 2 sips from a cup. Per CM note, plan is to DC home. Pt demonstrates a decline in functional status and will likely be total care at home. Family may need a hoyer and hospital bed if level of arousal does not improve. Recommend palliative care consult.  SpO2 96; HR 94 RA   Recommendations for follow up therapy are one component of a multi-disciplinary discharge planning process, led by the attending physician.  Recommendations may be updated based on patient status, additional functional criteria and insurance authorization.    Assistance Recommended at Discharge Frequent or constant Supervision/Assistance  Patient can return home with the following  Two people to help with walking and/or transfers;Two people to help with bathing/dressing/bathroom;Assistance with feeding;Assistance with cooking/housework;Direct supervision/assist for medications management;Direct supervision/assist for financial management;Assist for transportation;Help with stairs or ramp for entrance   Equipment Recommendations  Other (comment) (may need hoyer and hospital bed)    Recommendations for Other  Services  (palliative care consult)    Precautions / Restrictions Precautions Precautions: Fall Precaution Comments: rectal tube       Mobility Bed Mobility Overal bed mobility: Needs Assistance       Supine to sit: Total assist          Transfers                   General transfer comment: did not attempt     Balance     Sitting balance-Leahy Scale: Poor Sitting balance - Comments: kyphotic; leaning L/R                                   ADL either performed or assessed with clinical judgement   ADL                                         General ADL Comments: total A all ADL this date    Extremity/Trunk Assessment Upper Extremity Assessment Upper Extremity Assessment: Generalized weakness   Lower Extremity Assessment Lower Extremity Assessment: Defer to PT evaluation        Vision       Perception     Praxis      Cognition Arousal/Alertness: Lethargic Behavior During Therapy: Flat affect, Restless Overall Cognitive Status: No family/caregiver present to determine baseline cognitive functioning                                 General Comments: not following commands; brief moments  of eyes open and speech wich was not appropriate for covnersation        Exercises      Shoulder Instructions       General Comments      Pertinent Vitals/ Pain       Pain Assessment Pain Assessment: Faces Faces Pain Scale: No hurt  Home Living                                          Prior Functioning/Environment              Frequency  Min 2X/week        Progress Toward Goals  OT Goals(current goals can now be found in the care plan section)  Progress towards OT goals: Not progressing toward goals - comment  Acute Rehab OT Goals OT Goal Formulation: Patient unable to participate in goal setting Time For Goal Achievement: 05/27/22 Potential to Achieve Goals: Poor   Plan Other (comment);Discharge plan needs to be updated (goals no longer appropriate; need updating)    Co-evaluation                 AM-PAC OT "6 Clicks" Daily Activity     Outcome Measure   Help from another person eating meals?: Total Help from another person taking care of personal grooming?: Total Help from another person toileting, which includes using toliet, bedpan, or urinal?: Total Help from another person bathing (including washing, rinsing, drying)?: Total Help from another person to put on and taking off regular upper body clothing?: Total Help from another person to put on and taking off regular lower body clothing?: Total 6 Click Score: 6    End of Session    OT Visit Diagnosis: Unsteadiness on feet (R26.81);Muscle weakness (generalized) (M62.81);Other symptoms and signs involving cognitive function   Activity Tolerance     Patient Left     Nurse Communication          Time: 8469-6295 OT Time Calculation (min): 16 min  Charges: OT General Charges $OT Visit: 1 Visit OT Treatments $Self Care/Home Management : 8-22 mins  Luisa Dago, OT/L   Acute OT Clinical Specialist Acute Rehabilitation Services Pager [phone] Office [phone]   [facility] 05/23/2022, 3:25 PM

## 2022-05-23 NOTE — Progress Notes (Signed)
PROGRESS NOTE        PATIENT DETAILS Name: Cynthia Perry Age: 77 y.o. Sex: female Date of Birth: 11-10-1945 Admit Date: 05/09/2022 Admitting Physician Steffanie Dunn, DO JWJ:[id number], Talmadge Coventry, MD  Brief Summary: Patient is a 77 y.o.  female with history of ulcerative colitis-on mesalamine, advanced dementia-history of recent UTI treated with Bactrim-presented to the hospital on 5/6 with diarrhea/weakness/aggressive behavior-she was subsequently admitted to the hospitalist service.  Hospital course complicated by hypotension requiring pressors and transferred to the ICU.  See below for further details.  Significant events: 5/06>> admit to [facility] 5/07>> transferred to ICU for hypotension-low-dose pressors/stress dose steroids 5/09>> transfer to [facility] 5/13>> hypotensive-responded to IV fluid bolus-transfer to progressive care.  Significant studies: 5/06>> CXR: No active disease 5/06>> CT head: No acute intracranial abnormality.  3.8 soft tissue lesion-left pharyngeal space. 5/06>> CT abdomen/pelvis: Left hemicolon colitis  Significant microbiology data: 5/06>> stool C. difficile: Negative 5/06>> GI pathogen panel: Negative 5/06>> urine culture: E. coli 5/06>> blood culture: Negative 5/13>> GI pathogen panel: Negative  Procedures: None  Consults: PCCM GI  Subjective:   Pleasantly confused-in bed in no distress, denies any headache, no chest or abdominal pain.  Objective: Vitals: Blood pressure (!) 97/53, pulse 92, temperature 97.9 F (36.6 C), resp. rate 17, height 5\' 3"  (1.6 m), weight 54.8 kg, SpO2 90 %.   Exam:  Awake but confused, No new F.N deficits,   [place].AT,PERRAL Supple Neck, No JVD,   Symmetrical Chest wall movement, Good air movement bilaterally, CTAB RRR,No Gallops, Rubs or new Murmurs,  +ve B.Sounds, Abd Soft, No tenderness,   No Cyanosis, Clubbing or edema    Assessment/Plan:  Hypovolemic shock due to diarrhea Reoccurred on  5/13-responded to IVF-BP now stable for the past several days Continue to treat underlying UC flare Completed stress dose steroids IV fluids on hold since 05/20/2022 with electrolytes stable so far will be monitored closely.  Diarrhea-likely UC flare Stool studies negative Diarrhea off-and-on still significant now placed on scheduled Imodium and tincture of opium, dose adjusted on 05/22/2022, as needed Imodium also continued Continue mesalamine suppository Oral prednisone-will plan a slow taper Continue Creon Discussed with nursing staff-strict monitoring of stool output Given advanced dementia-limited options-will not tolerate any endoscopic evaluation. Discussed with Dr. Ula Lingo MD-if no response to above measures-will require octreotide.  Asymptomatic bacteriuria Urine culture positive for E. coli-UA done on same day-not suggestive of UTI.   Given dementia-history is limited but clinical features are not suggestive of UTI.  Acute metabolic encephalopathy superimposed on advanced dementia Worsening encephalopathy-in the setting of acute hospitalization/high-dose steroids/UC flare Treat underlying UC flare Continue Namenda-low-dose Seroquel Maintain delirium precautions  Hypokalemia GI loss Repleted  Normocytic anemia Secondary to acute illness No evidence of GI bleed/blood loss Follow CBC  3.8 soft tissue lesion-left pharyngeal space Seen incidentally on CT head Discussed with daughter on 5/14-we have decided to not pursue any further workup-as she would not be a candidate for surgical excision/chemo/radiation if this indeed turned out to be malignant.  Furthermore she is asymptomatic-and this is obviously a incidental finding.    Palliative care DNR Goals of care are for gentle medical treatment-hopefully we can find a way to slow her diarrhea/control colitis-however family aware that if despite our best efforts-she continues to decline/-hypotension etc.-we may need to  consider hospice care.   Nutrition Status: Nutrition  Problem: Severe Malnutrition Etiology: social / environmental circumstances (inability to self-feed) Signs/Symptoms: severe fat depletion, severe muscle depletion Interventions: Refer to RD note for recommendations  BMI/ Estimated body mass index is 21.4 kg/m as calculated from the following:   Height as of this encounter: 5\' 3"  (1.6 m).   Weight as of this encounter: 54.8 kg.    Code status:   Code Status: DNR    DVT Prophylaxis: heparin injection 5,000 Units Start: 05/10/22 1045 SCDs Start: 05/10/22 1610    Family Communication: Daughter-Karen-[phone]-update over the phone 05/21/22 message left at 8 AM, 05/22/2022 message left at 8:04 AM, 05/23/2022   Disposition Plan: Status is: Inpatient Remains inpatient appropriate because: Severity of illness   Planned Discharge Destination:Home health   Diet: Diet Order             Diet regular Room service appropriate? No; Fluid consistency: Thin  Diet effective now                   MEDICATIONS: Scheduled Meds:  acidophilus  1 capsule Oral TID WC   feeding supplement  237 mL Oral TID BM   fiber supplement (BANATROL TF)  60 mL Oral BID   Gerhardt's butt cream  1 Application Topical BID   heparin  5,000 Units Subcutaneous BID   lipase/protease/amylase  48,000 Units Oral TID AC   loperamide  4 mg Oral Q6H   memantine  5 mg Oral Daily   mesalamine  1,000 mg Rectal QHS   Opium  2 mL Oral TID   predniSONE  40 mg Oral Q breakfast   QUEtiapine  12.5 mg Oral QHS   saccharomyces boulardii  250 mg Oral BID   Continuous Infusions:   PRN Meds:.acetaminophen **OR** acetaminophen, loperamide HCl, mouth rinse, sodium chloride flush   I have personally reviewed following labs and imaging studies  LABORATORY DATA:  Recent Labs  Lab 05/18/22 0734 05/19/22 0405 05/21/22 0330 05/22/22 0355 05/23/22 0444  WBC 6.3 10.6* 4.0 4.1 6.1  HGB 10.8* 11.5* 10.9* 11.2*  10.1*  HCT 34.0* 34.7* 34.0* 34.6* 31.8*  PLT 285 328 281 347 339  MCV 81.3 78.9* 82.3 80.8 83.2  MCH 25.8* 26.1 26.4 26.2 26.4  MCHC 31.8 33.1 32.1 32.4 31.8  RDW 16.9* 16.7* 17.2* 17.0* 17.9*  LYMPHSABS  --   --  0.8 0.3* 0.7  MONOABS  --   --  0.4 0.3 0.8  EOSABS  --   --  0.0 0.0 0.0  BASOSABS  --   --  0.0 0.0 0.0    Recent Labs  Lab 05/16/22 1401 05/17/22 0459 05/17/22 0459 05/18/22 0734 05/19/22 0405 05/20/22 0627 05/21/22 0330 05/22/22 0355 05/23/22 0444  NA  --  135   < > 136 134* 135 136 134* 138  K  --  3.4*   < > 3.7 3.5 3.5 3.6 3.9 3.5  CL  --  103   < > 104 99 101 98 95* 94*  CO2  --  24   < > 25 26 26  32 30 36*  ANIONGAP  --  8   < > 7 9 8 6 9 8   GLUCOSE  --  218*   < > 186* 139* 81 100* 217* 149*  BUN  --  7*   < > 7* 8 9 7* 13 22  CREATININE  --  0.54   < > 0.55 0.60 0.48 0.57 0.58 0.72  AST  --  13*  --  11* 18  --   --   --   --   ALT  --  13  --  14 18  --   --   --   --   ALKPHOS  --  45  --  60 65  --   --   --   --   BILITOT  --  0.3  --  0.3 0.3  --   --   --   --   ALBUMIN  --  2.4*  --  2.7* 2.6*  --   --   --   --   LATICACIDVEN 2.4*  --   --   --   --   --   --   --   --   BNP  --   --   --   --   --   --  137.4* 173.9* 116.4*  MG  --   --   --  2.1  --   --  2.2 2.3 2.5*  CALCIUM  --  9.9   < > 11.1* 10.8* 10.2 10.5* 10.5* 11.0*   < > = values in this interval not displayed.    RADIOLOGY STUDIES/RESULTS: No results found.   LOS: 14 days  Signature  -    Susa Raring M.D on 05/23/2022 at 11:13 AM   -  To page go to [email or web address]

## 2022-05-24 DIAGNOSIS — G9341 Metabolic encephalopathy: Secondary | ICD-10-CM | POA: Diagnosis not present

## 2022-05-24 LAB — CBC WITH DIFFERENTIAL/PLATELET
Abs Immature Granulocytes: 0.1 10*3/uL — ABNORMAL HIGH (ref 0.00–0.07)
Basophils Absolute: 0.1 10*3/uL (ref 0.0–0.1)
Basophils Relative: 0 %
Eosinophils Absolute: 0 10*3/uL (ref 0.0–0.5)
Eosinophils Relative: 0 %
HCT: 34.7 % — ABNORMAL LOW (ref 36.0–46.0)
Hemoglobin: 10.9 g/dL — ABNORMAL LOW (ref 12.0–15.0)
Immature Granulocytes: 1 %
Lymphocytes Relative: 3 %
Lymphs Abs: 0.5 10*3/uL — ABNORMAL LOW (ref 0.7–4.0)
MCH: 26.4 pg (ref 26.0–34.0)
MCHC: 31.4 g/dL (ref 30.0–36.0)
MCV: 84 fL (ref 80.0–100.0)
Monocytes Absolute: 0.7 10*3/uL (ref 0.1–1.0)
Monocytes Relative: 5 %
Neutro Abs: 13 10*3/uL — ABNORMAL HIGH (ref 1.7–7.7)
Neutrophils Relative %: 91 %
Platelets: 332 10*3/uL (ref 150–400)
RBC: 4.13 MIL/uL (ref 3.87–5.11)
RDW: 18.6 % — ABNORMAL HIGH (ref 11.5–15.5)
WBC: 14.3 10*3/uL — ABNORMAL HIGH (ref 4.0–10.5)
nRBC: 0 % (ref 0.0–0.2)

## 2022-05-24 LAB — BASIC METABOLIC PANEL
Anion gap: 9 (ref 5–15)
BUN: 21 mg/dL (ref 8–23)
CO2: 33 mmol/L — ABNORMAL HIGH (ref 22–32)
Calcium: 11 mg/dL — ABNORMAL HIGH (ref 8.9–10.3)
Chloride: 99 mmol/L (ref 98–111)
Creatinine, Ser: 0.73 mg/dL (ref 0.44–1.00)
GFR, Estimated: >60 mL/min (ref >60)
Glucose, Bld: 131 mg/dL — ABNORMAL HIGH (ref 70–99)
Potassium: 4.1 mmol/L (ref 3.5–5.1)
Sodium: 141 mmol/L (ref 135–145)

## 2022-05-24 LAB — BRAIN NATRIURETIC PEPTIDE: B Natriuretic Peptide: 96.7 pg/mL (ref 0.0–100.0)

## 2022-05-24 LAB — MAGNESIUM: Magnesium: 2.2 mg/dL (ref 1.7–2.4)

## 2022-05-24 MED ORDER — PREDNISONE 5 MG PO TABS
30.0000 mg | ORAL_TABLET | Freq: Every day | ORAL | Status: DC
  Administered 2022-05-25: 30 mg via ORAL
  Filled 2022-05-24 (×2): qty 2

## 2022-05-24 MED ORDER — LOPERAMIDE HCL 2 MG PO CAPS
2.0000 mg | ORAL_CAPSULE | Freq: Three times a day (TID) | ORAL | Status: DC
  Administered 2022-05-24 – 2022-05-25 (×3): 2 mg via ORAL
  Filled 2022-05-24 (×3): qty 1

## 2022-05-24 NOTE — TOC Progression Note (Signed)
Transition of Care [facility]) - Progression Note    Patient Details  Name: Cynthia Perry MRN: [id number] Date of Birth: [birth year]  Transition [facility]) CM/SW Contact  Gordy Clement, RN Phone Number: 05/24/2022, 10:58 AM  Clinical Narrative:     CM spoke with Daughter, Clydie Braun regarding their desire for Hospice care in the home for patient.Patient lives at home with her Husband. He is in a wheelchair a lot of the day. Daughters are able to go and see patient daily to assist with meds, dressing etc, but cannot be there 24/7 Initially SNF was considered but patient would have to pay out of pocket and that is not affordable. Daughter would rather her Mother be able to return to her familiar surroundings.  [facility] has been contacted and will reach out to speak with Daughter about services.   TOC will continue to follow patient for any additional discharge needs         Expected Discharge Plan: Home w Home Health Services Barriers to Discharge: Continued Medical Work up  Expected Discharge Plan and Services   Discharge Planning Services: CM Consult Post Acute Care Choice: Home Health Living arrangements for the past 2 months: Single Family Home                 DME Arranged: N/A         HH Arranged: PT, OT HH Agency: [facility] Date HH Agency Contacted: 05/22/22 Time HH Agency Contacted: 1510 Representative spoke with at [facility] Agency: Barbara Cower   Social Determinants of Health (SDOH) Interventions SDOH Screenings   Food Insecurity: Patient Unable To Answer (05/10/2022)  Transportation Needs: Patient Unable To Answer (05/10/2022)  Utilities: Patient Unable To Answer (05/10/2022)  Depression (PHQ2-9): Low Risk  (03/28/2022)  Tobacco Use: Low Risk  (05/09/2022)    Readmission Risk Interventions     No data to display

## 2022-05-24 NOTE — Progress Notes (Signed)
PROGRESS NOTE        PATIENT DETAILS Name: Cynthia Perry Age: 77 y.o. Sex: female Date of Birth: Jul 08, 1945 Admit Date: 05/09/2022 Admitting Physician Steffanie Dunn, DO WUJ:[id number], Talmadge Coventry, MD  Brief Summary: Patient is a 77 y.o.  female with history of ulcerative colitis-on mesalamine, advanced dementia-history of recent UTI treated with Bactrim-presented to the hospital on 5/6 with diarrhea/weakness/aggressive behavior-she was subsequently admitted to the hospitalist service.  Hospital course complicated by hypotension requiring pressors and transferred to the ICU.  See below for further details.  Significant events: 5/06>> admit to [facility] 5/07>> transferred to ICU for hypotension-low-dose pressors/stress dose steroids 5/09>> transfer to [facility] 5/13>> hypotensive-responded to IV fluid bolus-transfer to progressive care.  Significant studies: 5/06>> CXR: No active disease 5/06>> CT head: No acute intracranial abnormality.  3.8 soft tissue lesion-left pharyngeal space. 5/06>> CT abdomen/pelvis: Left hemicolon colitis  Significant microbiology data: 5/06>> stool C. difficile: Negative 5/06>> GI pathogen panel: Negative 5/06>> urine culture: E. coli 5/06>> blood culture: Negative 5/13>> GI pathogen panel: Negative  Procedures: None  Consults: PCCM GI  Subjective:   Pleasantly confused-in bed in no distress, denies any headache, no chest or abdominal pain.  Objective: Vitals: Blood pressure (!) 130/57, pulse 100, temperature 98 F (36.7 C), temperature source Axillary, resp. rate 18, height 5\' 3"  (1.6 m), weight 54.8 kg, SpO2 94 %.   Exam:  Awake but confused, No new F.N deficits,   [place].AT,PERRAL Supple Neck, No JVD,   Symmetrical Chest wall movement, Good air movement bilaterally, CTAB RRR,No Gallops, Rubs or new Murmurs,  +ve B.Sounds, Abd Soft, No tenderness,   No Cyanosis, Clubbing or edema    Assessment/Plan:  Hypovolemic shock due  to diarrhea from UC flare, improved after mesalamine suppository, Creon, oral steroids and antidiarrheal medications both scheduled and as needed.  V fluids on hold since 05/20/2022.  Unfortunately long-term quality of life is not good, family understanding and now leaning towards possible hospice.   Diarrhea-likely UC flare Stool studies negative Diarrhea off-and-on still significant now placed on scheduled Imodium and tincture of opium, dose adjusted on 05/22/2022, as needed Imodium also continued Continue mesalamine suppository Oral prednisone-will plan a slow taper Continue Creon Discussed with nursing staff-strict monitoring of stool output Given advanced dementia-limited options-will not tolerate any endoscopic evaluation. Discussed with Dr. Ula Lingo MD-if no response to above measures-will require octreotide.  Asymptomatic bacteriuria Urine culture positive for E. coli-UA done on same day-not suggestive of UTI.   Given dementia-history is limited but clinical features are not suggestive of UTI.  Acute metabolic encephalopathy superimposed on advanced dementia Worsening encephalopathy-in the setting of acute hospitalization/high-dose steroids/UC flare Treat underlying UC flare Continue Namenda-low-dose Seroquel Maintain delirium precautions  Hypokalemia GI loss Repleted  Normocytic anemia Secondary to acute illness No evidence of GI bleed/blood loss Follow CBC  3.8 soft tissue lesion-left pharyngeal space Seen incidentally on CT head Discussed with daughter on 5/14-we have decided to not pursue any further workup-as she would not be a candidate for surgical excision/chemo/radiation if this indeed turned out to be malignant.  Furthermore she is asymptomatic-and this is obviously a incidental finding.    Palliative care DNR Goals of care are for gentle medical treatment-hopefully we can find a way to slow her diarrhea/control colitis-however family aware that if despite our  best efforts-she continues to decline/-hypotension etc.-we may need  to consider hospice care.   Nutrition Status: Nutrition Problem: Severe Malnutrition Etiology: social / environmental circumstances (inability to self-feed) Signs/Symptoms: severe fat depletion, severe muscle depletion Interventions: Refer to RD note for recommendations  BMI/ Estimated body mass index is 21.4 kg/m as calculated from the following:   Height as of this encounter: 5\' 3"  (1.6 m).   Weight as of this encounter: 54.8 kg.    Code status:   Code Status: DNR    DVT Prophylaxis: heparin injection 5,000 Units Start: 05/10/22 1045 SCDs Start: 05/10/22 6295    Family Communication: Daughter-Karen-[phone]-update over the phone 05/21/22 message left at 8 AM, 05/22/2022 message left at 8:04 AM, 05/23/2022 updated in detail, family now leaning more towards hospice.  Disposition Plan: Status is: Inpatient Remains inpatient appropriate because: Severity of illness   Planned Discharge Destination:Home health   Diet: Diet Order             Diet regular Room service appropriate? No; Fluid consistency: Thin  Diet effective now                   MEDICATIONS: Scheduled Meds:  acidophilus  1 capsule Oral TID WC   feeding supplement  237 mL Oral TID BM   fiber supplement (BANATROL TF)  60 mL Oral BID   Gerhardt's butt cream  1 Application Topical BID   heparin  5,000 Units Subcutaneous BID   lipase/protease/amylase  48,000 Units Oral TID AC   loperamide  2 mg Oral TID   memantine  5 mg Oral Daily   mesalamine  1,000 mg Rectal QHS   Opium  2 mL Oral BID   [START ON 05/25/2022] predniSONE  30 mg Oral Q breakfast   QUEtiapine  12.5 mg Oral QHS   saccharomyces boulardii  250 mg Oral BID   Continuous Infusions:   PRN Meds:.acetaminophen **OR** acetaminophen, loperamide HCl, mouth rinse, sodium chloride flush   I have personally reviewed following labs and imaging studies  LABORATORY  DATA:  Recent Labs  Lab 05/19/22 0405 05/21/22 0330 05/22/22 0355 05/23/22 0444 05/24/22 0525  WBC 10.6* 4.0 4.1 6.1 14.3*  HGB 11.5* 10.9* 11.2* 10.1* 10.9*  HCT 34.7* 34.0* 34.6* 31.8* 34.7*  PLT 328 281 347 339 332  MCV 78.9* 82.3 80.8 83.2 84.0  MCH 26.1 26.4 26.2 26.4 26.4  MCHC 33.1 32.1 32.4 31.8 31.4  RDW 16.7* 17.2* 17.0* 17.9* 18.6*  LYMPHSABS  --  0.8 0.3* 0.7 0.5*  MONOABS  --  0.4 0.3 0.8 0.7  EOSABS  --  0.0 0.0 0.0 0.0  BASOSABS  --  0.0 0.0 0.0 0.1    Recent Labs  Lab 05/18/22 0734 05/19/22 0405 05/20/22 0627 05/21/22 0330 05/22/22 0355 05/23/22 0444 05/24/22 0525  NA 136 134* 135 136 134* 138 141  K 3.7 3.5 3.5 3.6 3.9 3.5 4.1  CL 104 99 101 98 95* 94* 99  CO2 25 26 26  32 30 36* 33*  ANIONGAP 7 9 8 6 9 8 9   GLUCOSE 186* 139* 81 100* 217* 149* 131*  BUN 7* 8 9 7* 13 22 21   CREATININE 0.55 0.60 0.48 0.57 0.58 0.72 0.73  AST 11* 18  --   --   --   --   --   ALT 14 18  --   --   --   --   --   ALKPHOS 60 65  --   --   --   --   --  BILITOT 0.3 0.3  --   --   --   --   --   ALBUMIN 2.7* 2.6*  --   --   --   --   --   BNP  --   --   --  137.4* 173.9* 116.4* 96.7  MG 2.1  --   --  2.2 2.3 2.5* 2.2  CALCIUM 11.1* 10.8* 10.2 10.5* 10.5* 11.0* 11.0*    RADIOLOGY STUDIES/RESULTS: No results found.   LOS: 15 days  Signature  -    Susa Raring M.D on 05/24/2022 at 8:49 AM   -  To page go to [email or web address]

## 2022-05-24 NOTE — Progress Notes (Signed)
Physical Therapy Treatment Patient Details Name: Cynthia Perry MRN: [id number] DOB: [birth year] Today's Date: 05/24/2022   History of Present Illness 77 y.o. female presents to [facility] hospital on 05/09/2022 with loose watery stools, AMS, hypotension. Pt with recent UTI on bactrim. PMH includes ulcerative colitis, anxiety, depression, alzheimer's disease, HTN, GAD.    PT Comments    Pt confused (pt with known advanced dementia), restless, unintelligible speech, in ability to follow commands, and crying out "don't do this to me." Pt unable to follow commands or actively participate in therapy. Pt very kyphotic, hold elbows in flexed position with clenched fists. Pt totalA for transfer to EOB and attempt to stand. Pt continuing to have regression in mobility. Aware of discussions regarding transition of care to hospice. Recommend Palliative consult to help with goals of care. Acute PT to continue to follow until directed otherwise by family or medical team.    Recommendations for follow up therapy are one component of a multi-disciplinary discharge planning process, led by the attending physician.  Recommendations may be updated based on patient status, additional functional criteria and insurance authorization.  Follow Up Recommendations  Can patient physically be transported by private vehicle: No    Assistance Recommended at Discharge Frequent or constant Supervision/Assistance  Patient can return home with the following A lot of help with bathing/dressing/bathroom;Assistance with cooking/housework;Assistance with feeding;Direct supervision/assist for medications management;Direct supervision/assist for financial management;Assist for transportation;Help with stairs or ramp for entrance;Two people to help with walking and/or transfers   Equipment Recommendations  None recommended by PT    Recommendations for Other Services Other (comment) (palliative care consult)     Precautions / Restrictions  Precautions Precautions: Fall Precaution Comments: advance dementia Restrictions Weight Bearing Restrictions: No     Mobility  Bed Mobility Overal bed mobility: Needs Assistance Bed Mobility: Supine to Sit, Sit to Supine     Supine to sit: Total assist Sit to supine: Total assist   General bed mobility comments: HOB elevated, totalA to achieve EOB sitting, no initiation of task asked, totalA to return to supine safely. Upon return to supine, HOB elevated to 30 deg and pt repeatedly used abdominal muscles to bring self to long sit not to command, pt holds head in excessive cervical extension despite support of pillows    Transfers Overall transfer level: Needs assistance Equipment used: 2 person hand held assist (face to face transfer with bed pad) Transfers: Sit to/from Stand Sit to Stand: Total assist           General transfer comment: able to clear bottom however pt with hip and knee flexion and unable to achieve full upright posture, pt on balls of feet    Ambulation/Gait               General Gait Details: unable to attempt today   Stairs             Wheelchair Mobility    Modified Rankin (Stroke Patients Only)       Balance Overall balance assessment: Needs assistance Sitting-balance support: Bilateral upper extremity supported, Feet supported Sitting balance-Leahy Scale: Poor Sitting balance - Comments: kyphotic; leaning L/R, holding hands in closed fist position requiring maxA from PT to open hand and place in support position, pt holding legs in extension requiring maxtactile cues to bend at knees   Standing balance support: Bilateral upper extremity supported Standing balance-Leahy Scale: Zero Standing balance comment: dependent on external assist, unable to achieve full upright posture  Cognition Arousal/Alertness: Lethargic Behavior During Therapy: Flat affect, Restless Overall Cognitive Status:  No family/caregiver present to determine baseline cognitive functioning                                 General Comments: not following commands; brief moments of eyes open and speech wich was not appropriate for covnersation, very difficult to understand. When pt asked if her name was Cynthia Perry pt didn't respond, when pt asked if her name was Cynthia Perry she stated yes and then repeated [facility]        Exercises      General Comments General comments (skin integrity, edema, etc.): VSS, pt with pressure indentations on inside of R foot from being on SCD cord      Pertinent Vitals/Pain Pain Assessment Pain Assessment: Faces Faces Pain Scale: Hurts a little bit Pain Location: pt grimacing and groaning upon arrival stating "don't do this to me"    Home Living                          Prior Function            PT Goals (current goals can now be found in the care plan section) Acute Rehab PT Goals PT Goal Formulation: With family Time For Goal Achievement: 05/27/22 Potential to Achieve Goals: Fair Progress towards PT goals: Not progressing toward goals - comment    Frequency    Min 2X/week      PT Plan Frequency needs to be updated    Co-evaluation              AM-PAC PT "6 Clicks" Mobility   Outcome Measure  Help needed turning from your back to your side while in a flat bed without using bedrails?: Total Help needed moving from lying on your back to sitting on the side of a flat bed without using bedrails?: Total Help needed moving to and from a bed to a chair (including a wheelchair)?: Total Help needed standing up from a chair using your arms (e.g., wheelchair or bedside chair)?: Total Help needed to walk in hospital room?: Total Help needed climbing 3-5 steps with a railing? : Total 6 Click Score: 6    End of Session   Activity Tolerance: Patient limited by lethargy Patient left: in bed;with call bell/phone within reach;with bed alarm  set Nurse Communication: Mobility status PT Visit Diagnosis: Other abnormalities of gait and mobility (R26.89);Muscle weakness (generalized) (M62.81)     Time: 1610-9604 PT Time Calculation (min) (ACUTE ONLY): 21 min  Charges:  $Therapeutic Activity: 8-22 mins                     Lewis Shock, PT, DPT Acute Rehabilitation Services Secure chat preferred Office #: [phone]    Iona Hansen 05/24/2022, 9:18 AM

## 2022-05-25 ENCOUNTER — Other Ambulatory Visit (HOSPITAL_COMMUNITY): Payer: Self-pay

## 2022-05-25 ENCOUNTER — Telehealth (HOSPITAL_COMMUNITY): Payer: Self-pay | Admitting: Pharmacy Technician

## 2022-05-25 DIAGNOSIS — G9341 Metabolic encephalopathy: Secondary | ICD-10-CM | POA: Diagnosis not present

## 2022-05-25 MED ORDER — OPIUM 10 MG/ML (1%) PO TINC: 2.0000 mL | Freq: Two times a day (BID) | ORAL | 0 refills | Status: DC

## 2022-05-25 MED ORDER — QUETIAPINE FUMARATE 25 MG PO TABS
25.0000 mg | ORAL_TABLET | Freq: Every day | ORAL | 0 refills | Status: DC
  Filled 2022-05-25: qty 30, 30d supply, fill #0

## 2022-05-25 MED ORDER — PREDNISONE 10 MG PO TABS
10.0000 mg | ORAL_TABLET | Freq: Every day | ORAL | 0 refills | Status: DC
  Filled 2022-05-25: qty 30, 30d supply, fill #0

## 2022-05-25 MED ORDER — LOPERAMIDE HCL 2 MG PO CAPS
4.0000 mg | ORAL_CAPSULE | Freq: Four times a day (QID) | ORAL | 0 refills | Status: DC | PRN
  Filled 2022-05-25: qty 30, 4d supply, fill #0

## 2022-05-25 MED ORDER — SACCHAROMYCES BOULARDII 250 MG PO CAPS
250.0000 mg | ORAL_CAPSULE | Freq: Two times a day (BID) | ORAL | 0 refills | Status: DC
  Filled 2022-05-25: qty 30, 15d supply, fill #0

## 2022-05-25 MED ORDER — OPIUM 10 MG/ML (1%) PO TINC
2.0000 mL | Freq: Two times a day (BID) | ORAL | 0 refills | Status: DC
  Filled 2022-05-25: qty 118, 30d supply, fill #0

## 2022-05-25 MED ORDER — MESALAMINE 1000 MG RE SUPP
1000.0000 mg | Freq: Every day | RECTAL | 0 refills | Status: DC
  Filled 2022-05-25: qty 12, 12d supply, fill #0

## 2022-05-25 NOTE — Discharge Summary (Signed)
Cynthia Perry ZOX:[id number] DOB: [birth year] DOA: 05/09/2022  PCP: Mliss Sax, MD  Admit date: 05/09/2022  Discharge date: 05/25/2022  Admitted From: Home   Disposition:  Home with Hospice   Recommendations for Outpatient Follow-up:   Follow up with PCP in 1-2 weeks  PCP Please obtain BMP/CBC, 2 view CXR in 1week,  (see Discharge instructions)   PCP Please follow up on the following pending results:    Home Health: None   Equipment/Devices: as below  Consultations: None  Discharge Condition: Stable    CODE STATUS: Full    Diet Recommendation: Soft diet with feeding assistance and aspiration precautions, for comfort only.   Chief Complaint  Patient presents with   Failure To Thrive     Brief history of present illness from the day of admission and additional interim summary    77 y.o.  female with history of ulcerative colitis-on mesalamine, advanced dementia-history of recent UTI treated with Bactrim-presented to the hospital on 5/6 with diarrhea/weakness/aggressive behavior-she was subsequently admitted to the hospitalist service.  Hospital course complicated by hypotension requiring pressors and transferred to the ICU.  See below for further details.   Significant events: 5/06>> admit to [facility] 5/07>> transferred to ICU for hypotension-low-dose pressors/stress dose steroids 5/09>> transfer to [facility] 5/13>> hypotensive-responded to IV fluid bolus-transfer to progressive care.   Significant studies: 5/06>> CXR: No active disease 5/06>> CT head: No acute intracranial abnormality.  3.8 soft tissue lesion-left pharyngeal space. 5/06>> CT abdomen/pelvis: Left hemicolon colitis   Significant microbiology data: 5/06>> stool C. difficile: Negative 5/06>> GI pathogen panel: Negative 5/06>> urine culture:  E. coli 5/06>> blood culture: Negative 5/13>> GI pathogen panel: Negative   Procedures: None   Consults: PCCM GI                                                                 Hospital Course   Hypovolemic shock due to diarrhea from UC flare, improved after mesalamine suppository, Creon, oral steroids and antidiarrheal medications both scheduled and as needed.  V fluids on hold since 05/20/2022.  Unfortunately long-term quality of life is not good, family is quite understanding and wants to take her home with home hospice.  Goal of care now is comfort.     Diarrhea-likely UC flare Stool studies negative Diarrhea resolved after she was placed on scheduled Imodium and tincture of opium, will be discharged on scheduled interop DM and as needed Imodium along with probiotic, goal of care is comfort, hospice team to monitor.  Also on scheduled 10 mg of prednisone along with her mesalamine suppository and home ulcerative colitis medication.  Goal of care is now comfort and symptom management.  Case was discussed with GI team during this hospitalization.     Asymptomatic bacteriuria Urine culture positive for E.  coli-UA done on same day-not suggestive of UTI.   Given dementia-history is limited but clinical features are not suggestive of UTI.   Acute metabolic encephalopathy superimposed on advanced dementia Worsening encephalopathy-in the setting of acute hospitalization/high-dose steroids/UC flare Continue Namenda-low-dose Seroquel Maintain delirium precautions   Hypokalemia GI loss Repleted   Normocytic anemia Secondary to acute illness No evidence of GI bleed/blood loss Follow CBC   3.8 soft tissue lesion-left pharyngeal space Seen incidentally on CT head Discussed with daughter on 5/14-we have decided to not pursue any further workup-as she would not be a candidate for surgical excision/chemo/radiation if this indeed turned out to be malignant.  Furthermore she is  asymptomatic-and this is obviously a incidental finding.  Main goal of care now is comfort.    Discharge diagnosis     Principal Problem:   Acute metabolic encephalopathy Active Problems:   Ulcerative colitis without complications (HCC)   Protein-calorie malnutrition, severe (HCC)   Moderate dementia (HCC)   Acute cystitis   Diarrhea   Shock [facility])    Discharge instructions    Discharge Instructions     Discharge instructions   Complete by: As directed    Disposition.  Residential hospice Condition.  Guarded CODE STATUS.  DNR Activity.  With assistance as tolerated, full fall precautions. Diet.  Soft with feeding assistance and aspiration precautions. Goal of care.  Comfort.       Discharge Medications   Allergies as of 05/25/2022   No Known Allergies      Medication List     STOP taking these medications    feeding supplement Liqd   Gerhardt's butt cream Crea   HYDROcodone-acetaminophen 5-325 MG tablet Commonly known as: NORCO/VICODIN   multivitamin with minerals Tabs tablet       TAKE these medications    acetaminophen 325 MG tablet Commonly known as: TYLENOL Take 650 mg by mouth every 6 (six) hours as needed for moderate pain.   loperamide 2 MG capsule Commonly known as: IMODIUM Take 2 capsules (4 mg total) by mouth every 6 (six) hours as needed for diarrhea or loose stools.   memantine 5 MG tablet Commonly known as: NAMENDA Take 1 tablet (5 mg total) by mouth at bedtime.   mesalamine 0.375 g 24 hr capsule Commonly known as: APRISO Take 1,500 mg by mouth daily. What changed: Another medication with the same name was added. Make sure you understand how and when to take each.   mesalamine 1000 MG suppository Commonly known as: CANASA Place 1 suppository (1,000 mg total) rectally at bedtime. What changed: You were already taking a medication with the same name, and this prescription was added. Make sure you understand how and when to take  each.   Opium 10 MG/ML (1%) Tinc Take 2 mLs (20 mg total) by mouth 2 (two) times daily.   predniSONE 10 MG tablet Commonly known as: DELTASONE Take 1 tablet (10 mg total) by mouth daily with breakfast. Start taking on: May 26, 2022   QUEtiapine 25 MG tablet Commonly known as: SEROQUEL Take 1 tablet (25 mg total) by mouth at bedtime. What changed: Another medication with the same name was removed. Continue taking this medication, and follow the directions you see here.   saccharomyces boulardii 250 MG capsule Commonly known as: FLORASTOR Take 1 capsule (250 mg total) by mouth 2 (two) times daily.               Durable Medical Equipment  (From admission, onward)  Start     Ordered   05/25/22 0854  For home use only DME Hospital bed  Once       Question Answer Comment  Length of Need 6 Months   Patient has (list medical condition): septic emia;severe malnutrition; non ambulatory   The above medical condition requires: Patient requires the ability to reposition frequently   Head must be elevated greater than: 30 degrees   Bed type Semi-electric   Support Surface: Gel Overlay      05/25/22 0858             Follow-up Information     [facility], [facility] Follow up.   Why: they will call you to set up a time for them to come out to your house for therapies Contact information: [address] [place] [address] [phone]         Mliss Sax, MD. Schedule an appointment as soon as possible for a visit in 1 week(s).   Specialty: Family Medicine Contact information: [address] [place] [address] [phone]         Charlott Rakes, MD. Schedule an appointment as soon as possible for a visit in 1 week(s).   Specialty: Gastroenterology Contact information: [address] [place] [address] [phone]                 Major procedures and Radiology Reports  - PLEASE review detailed and final reports thoroughly  -      [place] EKG SITE RITE  Result Date: 05/10/2022 If Site Rite image not attached, placement could not be confirmed due to current cardiac rhythm.  CT Head Wo Contrast  Result Date: 05/09/2022 CLINICAL DATA:  Mental status change, unknown cause Memory loss Failure to thrive. Confusion. EXAM: CT HEAD WITHOUT CONTRAST TECHNIQUE: Contiguous axial images were obtained from the base of the skull through the vertex without intravenous contrast. RADIATION DOSE REDUCTION: This exam was performed according to the departmental dose-optimization program which includes automated exposure control, adjustment of the mA and/or kV according to patient size and/or use of iterative reconstruction technique. COMPARISON:  Head CT 08/14/2020 FINDINGS: Brain: No acute intracranial hemorrhage. No evidence of acute ischemia. Generalized atrophy. Mild periventricular chronic small vessel ischemic change. No hydrocephalus. No midline shift or mass lesion/mass effect. No subdural or extra-axial collection. Vascular: No hyperdense vessel or unexpected calcification. Skull: No fracture or focal lesion. Sinuses/Orbits: Partial opacification of the left mastoid air cells. Bilateral cataract resection. There is a 3.8 cm soft tissue lesion in the left pharyngeal space, for example series 3, image 4. This is only partially included in the field of view, but does appear to have some mild mass effect. Other: None. IMPRESSION: 1. No acute intracranial abnormality. 2. Generalized atrophy and chronic small vessel ischemic change. 3. A 3.8 cm soft tissue lesion in the left pharyngeal space is only partially included in the field of view, but does appear to have some mild mass effect. Recommend further evaluation with contrast-enhanced neck CT. This retrospectively with likely present on August 2022 head CT without gross interval change from that exam, suggesting a nonaggressive process. Given  patient was administered IV contrast for concurrent abdominal CT, recommended exam is non emergent, and could be performed on an elective basis. IV contrast is highly recommended for the neck CT evaluation. Electronically Signed   By: Narda Rutherford M.D.   On: 05/09/2022 20:18   CT ABDOMEN PELVIS W CONTRAST  Result Date: 05/09/2022 CLINICAL DATA:  Acute abdominal pain. More confused than normal. Failure to thrive EXAM: CT ABDOMEN AND PELVIS WITH CONTRAST TECHNIQUE: Multidetector CT imaging of the abdomen and pelvis was performed using the standard protocol following bolus administration of intravenous contrast. RADIATION DOSE REDUCTION: This exam was performed according to the departmental dose-optimization program which includes automated exposure control, adjustment of the mA and/or kV according to patient size and/or use of iterative reconstruction technique. CONTRAST:  75mL OMNIPAQUE IOHEXOL 350 MG/ML SOLN COMPARISON:  CT 05/30/2021 and older. FINDINGS: Lower chest: There is some linear opacity at the left lung base likely scar or atelectasis. No pleural effusion. Small pericardial effusion. The heart nonenlarged. Coronary artery calcifications are seen. Hepatobiliary: No focal liver abnormality is seen. No gallstones, gallbladder wall thickening, or biliary dilatation. Pancreas: Unremarkable. No pancreatic ductal dilatation or surrounding inflammatory changes. Spleen: Normal in size without focal abnormality. Adrenals/Urinary Tract: Adrenal glands are preserved. Punctate nonobstructing left-sided renal stone. Bilateral Bosniak 1 and 2 cysts. No specific imaging follow-up. One of the larger foci are seen upper pole left kidney measuring 17 mm and Hounsfield units of 15. This also is unchanged from previous. Preserved contours of the urinary bladder. Stomach/Bowel: The large bowel is nondilated. There is a long segment along the left side of the colon of wall thickening, stranding and mucosal  hyperenhancement this involves portions of the distal transverse colon, descending colon, rectosigmoid colon. Please correlate for colitis. The right side of the colon is nondilated with some stool. Cecum resides in the right hemipelvis. Appendix is poorly seen today. No pericecal stranding or fluid. The stomach is nondilated. Small bowel is nondilated. Vascular/Lymphatic: Aortic atherosclerosis. No enlarged abdominal or pelvic lymph nodes. Reproductive: Status post hysterectomy. No adnexal masses. Other: Patient is tilted in the scanner to the left. There is streak artifact as the arms were scanned across the abdomen. No free air or fluid collections. Musculoskeletal: Scattered degenerative changes along the spine. Trace anterolisthesis of L4 on L5. IMPRESSION: Long segment left hemicolon colitis. Stranding and mucosal hyperenhancement. No obstruction, free air or free fluid. Please correlate with clinical history. Electronically Signed   By: Karen Kays M.D.   On: 05/09/2022 20:16   DG Chest Portable 1 View  Result Date: 05/09/2022 CLINICAL DATA:  Shortness of breath EXAM: PORTABLE CHEST 1 VIEW COMPARISON:  Chest x-ray 05/29/2021 FINDINGS: The heart size and mediastinal contours are within normal limits. Both lungs are clear. The visualized skeletal structures are unremarkable. IMPRESSION: No active disease. Electronically Signed   By: Darliss Cheney M.D.   On: 05/09/2022 19:05    Micro Results    Recent Results (from the past 240 hour(s))  Gastrointestinal Panel by PCR , Stool     Status: None   Collection Time: 05/16/22  4:23 PM   Specimen: Stool  Result Value Ref Range Status   Campylobacter species NOT DETECTED NOT DETECTED Final   Plesimonas shigelloides NOT DETECTED NOT DETECTED Final   Salmonella species NOT DETECTED NOT DETECTED Final   Yersinia enterocolitica NOT DETECTED NOT DETECTED Final   Vibrio species NOT DETECTED NOT DETECTED Final   Vibrio cholerae NOT DETECTED NOT DETECTED Final    Enteroaggregative E coli (EAEC) NOT DETECTED NOT DETECTED Final   Enteropathogenic E coli (EPEC) NOT DETECTED NOT DETECTED Final   Enterotoxigenic E coli (ETEC) NOT DETECTED NOT DETECTED Final   Shiga like toxin producing E coli (STEC) NOT DETECTED NOT DETECTED Final   Shigella/Enteroinvasive E coli (EIEC) NOT DETECTED  NOT DETECTED Final   Cryptosporidium NOT DETECTED NOT DETECTED Final   Cyclospora cayetanensis NOT DETECTED NOT DETECTED Final   Entamoeba histolytica NOT DETECTED NOT DETECTED Final   Giardia lamblia NOT DETECTED NOT DETECTED Final   Adenovirus F40/41 NOT DETECTED NOT DETECTED Final   Astrovirus NOT DETECTED NOT DETECTED Final   Norovirus GI/GII NOT DETECTED NOT DETECTED Final   Rotavirus A NOT DETECTED NOT DETECTED Final   Sapovirus (I, II, IV, and V) NOT DETECTED NOT DETECTED Final    Comment: Performed at [facility], [address] [place], [place] [address]    Today   Subjective    Lionor Hilde today remains confused but in no distress, denies any headache.   Objective   Blood pressure 123/61, pulse 100, temperature 98.2 F (36.8 C), temperature source Oral, resp. rate 18, height 5\' 3"  (1.6 m), weight 54.8 kg, SpO2 94 %.   Intake/Output Summary (Last 24 hours) at 05/25/2022 0905 Last data filed at 05/25/2022 0500 Gross per 24 hour  Intake --  Output 900 ml  Net -900 ml    Exam  Awake but confused, extremely weak and cachectic, moves all 4 extremities  [place].AT,PERRAL Supple Neck,   Symmetrical Chest wall movement, Good air movement bilaterally, CTAB RRR,No Gallops,   +ve B.Sounds, Abd Soft, Non tender,  No Cyanosis, Clubbing or edema    Data Review   Recent Labs  Lab 05/19/22 0405 05/21/22 0330 05/22/22 0355 05/23/22 0444 05/24/22 0525  WBC 10.6* 4.0 4.1 6.1 14.3*  HGB 11.5* 10.9* 11.2* 10.1* 10.9*  HCT 34.7* 34.0* 34.6* 31.8* 34.7*  PLT 328 281 347 339 332  MCV 78.9* 82.3 80.8 83.2 84.0  MCH 26.1 26.4 26.2 26.4 26.4  MCHC  33.1 32.1 32.4 31.8 31.4  RDW 16.7* 17.2* 17.0* 17.9* 18.6*  LYMPHSABS  --  0.8 0.3* 0.7 0.5*  MONOABS  --  0.4 0.3 0.8 0.7  EOSABS  --  0.0 0.0 0.0 0.0  BASOSABS  --  0.0 0.0 0.0 0.1    Recent Labs  Lab 05/19/22 0405 05/20/22 0627 05/21/22 0330 05/22/22 0355 05/23/22 0444 05/24/22 0525  NA 134* 135 136 134* 138 141  K 3.5 3.5 3.6 3.9 3.5 4.1  CL 99 101 98 95* 94* 99  CO2 26 26 32 30 36* 33*  ANIONGAP 9 8 6 9 8 9   GLUCOSE 139* 81 100* 217* 149* 131*  BUN 8 9 7* 13 22 21   CREATININE 0.60 0.48 0.57 0.58 0.72 0.73  AST 18  --   --   --   --   --   ALT 18  --   --   --   --   --   ALKPHOS 65  --   --   --   --   --   BILITOT 0.3  --   --   --   --   --   ALBUMIN 2.6*  --   --   --   --   --   BNP  --   --  137.4* 173.9* 116.4* 96.7  MG  --   --  2.2 2.3 2.5* 2.2  CALCIUM 10.8* 10.2 10.5* 10.5* 11.0* 11.0*    Total Time in preparing paper work, data evaluation and todays exam - 35 minutes  Signature  -    Susa Raring M.D on 05/25/2022 at 9:05 AM   -  To page go to [email or web address]

## 2022-05-25 NOTE — Telephone Encounter (Signed)
Patient Advocate Encounter   Received notification that prior authorization for QUEtiapine Fumarate 25MG  tablets is required.   PA submitted on 05/25/2022 [facility] Electronic PA Form Status is pending       Roland Earl, CPhT Pharmacy Patient Advocate Specialist [facility] Patient Advocate Team Direct Number: [phone]  Fax: [phone]

## 2022-05-25 NOTE — TOC Progression Note (Signed)
Transition of Care [facility]) - Progression Note    Patient Details  Name: Cynthia Perry MRN: [id number] Date of Birth: [birth year]  [facility] CM/SW Contact  Gordy Clement, RN Phone Number: 05/25/2022, 9:27 AM  Clinical Narrative:     CM spoke with [facility]  CM placed order for hospital bed. Transportation for patient via [facility] will be arranged after hospital bed is delivered.   TOC will continue to follow patient for any additional discharge needs   Anticipate DC later today      Expected Discharge Plan: Home w Home Health Services Barriers to Discharge: Continued Medical Work up  Expected Discharge Plan and Services   Discharge Planning Services: CM Consult Post Acute Care Choice: Home Health Living arrangements for the past 2 months: Single Family Home Expected Discharge Date: 05/25/22               DME Arranged: N/A         HH Arranged: PT, OT HH Agency: [facility] Date HH Agency Contacted: 05/22/22 Time HH Agency Contacted: 1510 Representative spoke with at [facility] Agency: Barbara Cower   Social Determinants of Health (SDOH) Interventions SDOH Screenings   Food Insecurity: Patient Unable To Answer (05/10/2022)  Transportation Needs: Patient Unable To Answer (05/10/2022)  Utilities: Patient Unable To Answer (05/10/2022)  Depression (PHQ2-9): Low Risk  (03/28/2022)  Tobacco Use: Low Risk  (05/09/2022)    Readmission Risk Interventions     No data to display

## 2022-05-25 NOTE — Discharge Instructions (Signed)
Disposition.  Residential hospice °Condition.  Guarded °CODE STATUS.  DNR °Activity.  With assistance as tolerated, full fall precautions. °Diet.  Soft with feeding assistance and aspiration precautions. °Goal of care.  Comfort. ° °

## 2022-05-25 NOTE — Telephone Encounter (Signed)
Patient Advocate Encounter  Prior Authorization for QUEtiapine Fumarate 25MG  tablets  has been approved.    PA# [id number] Insurance [employer] Electronic PA Form  Effective dates: 05/25/2022 through 01/03/2023      Roland Earl, CPhT Pharmacy Patient Advocate Specialist [facility] Patient Advocate Team Direct Number: [phone]  Fax: [phone]

## 2022-05-26 ENCOUNTER — Telehealth: Payer: Self-pay

## 2022-05-26 NOTE — Telephone Encounter (Signed)
Pt's daughter, Renaldo Harrison, notified of approval during [facility] call.

## 2022-05-26 NOTE — Transitions of Care (Post Inpatient/ED Visit) (Signed)
05/26/2022  Name: Cynthia Perry MRN: [id number] DOB: 22-Jun-1945  Today's TOC FU Call Status: Today's TOC FU Call Status:: Successful TOC FU Call Competed TOC FU Call Complete Date: 05/26/22  Transition Care Management Follow-up Telephone Call Date of Discharge: 05/25/22 Discharge Facility: [facility]) Type of Discharge: Inpatient Admission Primary Inpatient Discharge Diagnosis:: Encephalopathy Any questions or concerns?: No  Items Reviewed: Did you receive and understand the discharge instructions provided?: Yes Medications obtained,verified, and reconciled?: Yes (Medications Reviewed) Any new allergies since your discharge?: No Dietary orders reviewed?: No Do you have support at home?: Yes People in Home: child(ren), adult  Medications Reviewed Today: Medications Reviewed Today     Reviewed by Hale Bogus, CPhT (Pharmacy Technician) on 05/09/22 at 2024  Med List Status: Complete   Medication Order Taking? Sig Documenting Provider Last Dose Status Informant  acetaminophen (TYLENOL) 325 MG tablet [id number] Yes Take 650 mg by mouth every 6 (six) hours as needed for moderate pain. [provider] Past Week Active Child  feeding supplement (ENSURE ENLIVE / ENSURE PLUS) LIQD [id number] No Take 237 mLs by mouth 2 (two) times daily between meals.  Patient not taking: Reported on 03/28/2022   Merlene Laughter, DO Not Taking Active Child  HYDROcodone-acetaminophen (NORCO/VICODIN) 5-325 MG tablet [id number] No Take 1 tablet by mouth every 6 (six) hours as needed for severe pain.  Patient not taking: Reported on 03/28/2022   Merlene Laughter, DO Not Taking Active Child  memantine [facility]) 5 MG tablet [id number] Yes Take 1 tablet (5 mg total) by mouth at bedtime. Marcos Eke, PA-C 05/08/2022 Active Child  mesalamine (APRISO) 0.375 g 24 hr capsule [id number] Yes Take 1,500 mg by mouth daily. [provider] 05/09/2022 Active Child  Multiple Vitamin  (MULTIVITAMIN WITH MINERALS) TABS tablet [id number] No Take 1 tablet by mouth daily.  Patient not taking: Reported on 03/28/2022   Merlene Laughter, DO Not Taking Active Child  Nystatin (GERHARDT'S BUTT CREAM) CREA [id number] No Apply 1 Application topically 2 (two) times daily.  Patient not taking: Reported on 05/09/2022   Mliss Sax, MD Not Taking Active Child  QUEtiapine (SEROQUEL) 25 MG tablet [id number]  Take 0.5 tablets (12.5 mg total) by mouth daily. Sheikh, Omair Latif, DO  Active   QUEtiapine (SEROQUEL) 25 MG tablet [id number] No Take 1 tablet (25 mg total) by mouth at bedtime.  Patient not taking: Reported on 03/28/2022   Merlene Laughter, DO Not Taking Active Child            Home Care and Equipment/Supplies: Were Home Health Services Ordered?: Yes Name of Home Health Agency:: [facility] Has Agency set up a time to come to your home?: Yes First Home Health Visit Date: 05/26/22 Any new equipment or medical supplies ordered?: Yes Name of Medical supply agency?: bed, bedside table Were you able to get the equipment/medical supplies?: Yes Do you have any questions related to the use of the equipment/supplies?: No  Functional Questionnaire: Do you need assistance with bathing/showering or dressing?: Yes Do you need assistance with meal preparation?: Yes Do you need assistance with eating?: Yes Do you have difficulty maintaining continence: Yes Do you need assistance with getting out of bed/getting out of a chair/moving?: Yes Do you have difficulty managing or taking your medications?: Yes  Follow up appointments reviewed: PCP Follow-up appointment confirmed?: NA (Followed by hospice) Specialist Hospital Follow-up appointment confirmed?: NA Do you need transportation to your follow-up appointment?: No Do you understand care  options if your condition(s) worsen?: Yes-patient verbalized understanding    SIGNATURE Arvil Persons, BSN, RN

## 2022-05-27 ENCOUNTER — Encounter: Payer: Medicare Other | Admitting: Psychology

## 2022-05-29 DIAGNOSIS — R404 Transient alteration of awareness: Secondary | ICD-10-CM | POA: Diagnosis not present

## 2022-05-29 DIAGNOSIS — R55 Syncope and collapse: Secondary | ICD-10-CM | POA: Diagnosis not present

## 2022-06-02 ENCOUNTER — Telehealth: Payer: Self-pay | Admitting: Family Medicine

## 2022-06-02 ENCOUNTER — Encounter: Payer: Medicare Other | Admitting: Psychology

## 2022-06-02 NOTE — Telephone Encounter (Signed)
[employer] called and said that they faxed death paperwork that has to be signed /filled out if any questions call [phone]

## 2022-06-03 NOTE — Telephone Encounter (Signed)
The funeral home called again wanting to know when dr Doreene Burke is going to sign the elecronic death certificate

## 2022-06-04 NOTE — Care Management Important Message (Signed)
Important Message  Patient Details  Name: Cynthia Perry MRN: [id number] Date of Birth: [birth year]   Medicare Important Message Given:  Yes  Patient left prior to Important Message being provided.   Ylianna Almanzar Stefan Church

## 2022-06-04 DEATH — deceased

## 2022-06-06 NOTE — Telephone Encounter (Signed)
Pat from [facility] is checking on status of this message. Dennie Bible is saying this form should be in the [employer]. If you have any questions please call Dennie Bible at [phone] from the funeral home.

## 2022-06-07 NOTE — Telephone Encounter (Signed)
Spoke with Cynthia Perry at [facility], explained to [place] that Dr. Doreene Burke was not able to pull up the certificate that in [place] electronic system. Per Cynthia Perry she will see if health department would give [place] a call to walk Dr. Doreene Burke through pulling up death certificate.

## 2022-06-07 NOTE — Telephone Encounter (Signed)
Called Pat to inform that Dr. Doreene Burke was currently working on death certificate. Per Dennie Bible this was completed by Dr. Clelia Croft one of the doctors at [facility] who was seeing patient.

## 2022-07-06 ENCOUNTER — Ambulatory Visit: Payer: Medicare Other | Admitting: Physician Assistant
